# Patient Record
Sex: Female | Born: 1935 | Race: White | Hispanic: No | Smoking: Never smoker
Health system: Southern US, Community
[De-identification: ages and names within clinical notes are randomized; demographics above are authoritative.]

## PROBLEM LIST (undated history)

## (undated) DIAGNOSIS — M1990 Unspecified osteoarthritis, unspecified site: Secondary | ICD-10-CM

## (undated) DIAGNOSIS — Z96649 Presence of unspecified artificial hip joint: Secondary | ICD-10-CM

## (undated) DIAGNOSIS — R32 Unspecified urinary incontinence: Secondary | ICD-10-CM

## (undated) DIAGNOSIS — I4891 Unspecified atrial fibrillation: Secondary | ICD-10-CM

## (undated) DIAGNOSIS — I10 Essential (primary) hypertension: Secondary | ICD-10-CM

## (undated) DIAGNOSIS — F0390 Unspecified dementia without behavioral disturbance: Secondary | ICD-10-CM

## (undated) DIAGNOSIS — Z96659 Presence of unspecified artificial knee joint: Secondary | ICD-10-CM

## (undated) HISTORY — PX: OTHER SURGICAL HISTORY: SHX169

## (undated) HISTORY — PX: TOTAL ABDOMINAL HYSTERECTOMY W/ BILATERAL SALPINGOOPHORECTOMY: SHX83

## (undated) HISTORY — PX: KNEE SURGERY: SHX244

## (undated) HISTORY — PX: REPLACEMENT TOTAL KNEE: SUR1224

---

## 2014-11-12 ENCOUNTER — Ambulatory Visit (INDEPENDENT_AMBULATORY_CARE_PROVIDER_SITE_OTHER): Payer: Medicare Other | Admitting: Podiatry

## 2014-11-12 ENCOUNTER — Encounter: Payer: Self-pay | Admitting: Podiatry

## 2014-11-12 VITALS — BP 109/67 | HR 76 | Resp 16 | Ht 66.0 in | Wt 145.0 lb

## 2014-11-12 DIAGNOSIS — M21372 Foot drop, left foot: Secondary | ICD-10-CM

## 2014-11-12 DIAGNOSIS — M79676 Pain in unspecified toe(s): Secondary | ICD-10-CM

## 2014-11-12 DIAGNOSIS — L84 Corns and callosities: Secondary | ICD-10-CM

## 2014-11-12 DIAGNOSIS — B351 Tinea unguium: Secondary | ICD-10-CM

## 2014-11-12 NOTE — Progress Notes (Signed)
   Subjective:    Patient ID: Jill House, female    DOB: 1923-12-19, 80 y.o.   MRN: [id number]  HPI Comments: 80 year old female presents to the office today with her niece for whom she lives with for evaluation of painful elongated toenails as well as a callus on the right foot. The patient also has a history of venous leg ulcerations for which he has seen by the vein specialists. She has had 2 rounds of Keflex that she referred ulceration on the left lower shoe me. They deny any recent ulceration or any current ulceration. 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 She does have an AFO for which she does not wear as she states that she gets a well without any difficulties without it. She also states she has chronic swelling to the left leg. No other complaints at this time.    Foot Pain      Review of Systems  Cardiovascular: Positive for leg swelling.  Musculoskeletal: Positive for back pain.       Difficulty walking  All other systems reviewed and are negative.      Objective:   Physical Exam AAO x3, NAD DP pulses palpable, PT pulses decreased. CRT < 3 sec Protective sensation decreased with Simms Weinstein monofilament, decreased aperture sensation, decreased Achilles tendon reflex Decreased dorsiflexion of the left lower extremity. There is dropfoot present. Nails hypertrophic, dystrophic, elongated, brittle, discolored 10. No surrounding erythema or drainage from around the nail sites. Hyperkeratotic lesion along the medial aspect of the IPJ on the right hallux. There is mild hyperkeratotic lesions overlying the medial aspect of the first and tea PJ bilaterally. Upon debridement underlying ulceration. There is hyperpigmented areas overlying the dorsal aspect of left foot/anterior ankle from prior ulceration. Currently there is no evidence of skin breakdown. There is mild chronic edema to  the left lower extremity.  There is no pain with calf compression, sewing, warmth, erythema        Assessment & Plan:  80 year old female symptomatic onychomycosis, hyperkeratotic lesions; dropfoot -Treatment options were discussed including alternatives, risks, complications. -Nail sharply debrided 10 without complications.  -Hyperkeratotic lesion x2 sharply debrided without complication (There was not significant buildup of tissue along the left foot).  -Discussed the importance of daily foot inspection -She has a brace which is fitting well without any difficulties. She chooses not to wear it.  -Follow-up in 3 months, or sooner should any problems arise. In the meantime, call the office with any questions, concerns, change in symptoms.  -

## 2014-11-30 LAB — CBC WITH DIFFERENTIAL/PLATELET
BASOS ABS: 0.1 10*3/uL (ref 0.0–0.1)
Basophil %: 0.6 %
Eosinophil #: 0 10*3/uL (ref 0.0–0.7)
Eosinophil %: 0.2 %
HCT: 33.4 % — ABNORMAL LOW (ref 35.0–47.0)
HGB: 10.4 g/dL — ABNORMAL LOW (ref 12.0–16.0)
LYMPHS ABS: 0.5 10*3/uL — AB (ref 1.0–3.6)
Lymphocyte %: 4.1 %
MCH: 25.8 pg — AB (ref 26.0–34.0)
MCHC: 31.1 g/dL — AB (ref 32.0–36.0)
MCV: 83 fL (ref 80–100)
Monocyte #: 1.1 x10 3/mm — ABNORMAL HIGH (ref 0.2–0.9)
Monocyte %: 8.9 %
Neutrophil #: 10.7 10*3/uL — ABNORMAL HIGH (ref 1.4–6.5)
Neutrophil %: 86.2 %
PLATELETS: 124 10*3/uL — AB (ref 150–440)
RBC: 4.02 10*6/uL (ref 3.80–5.20)
RDW: 17.6 % — ABNORMAL HIGH (ref 11.5–14.5)
WBC: 12.4 10*3/uL — ABNORMAL HIGH (ref 3.6–11.0)

## 2014-11-30 LAB — COMPREHENSIVE METABOLIC PANEL
ALT: 10 U/L — AB
AST: 14 U/L — AB (ref 15–37)
Albumin: 3.4 g/dL (ref 3.4–5.0)
Alkaline Phosphatase: 67 U/L
Anion Gap: 5 — ABNORMAL LOW (ref 7–16)
BUN: 29 mg/dL — ABNORMAL HIGH (ref 7–18)
Bilirubin,Total: 0.6 mg/dL (ref 0.2–1.0)
CALCIUM: 8.2 mg/dL — AB (ref 8.5–10.1)
CHLORIDE: 102 mmol/L (ref 98–107)
Co2: 30 mmol/L (ref 21–32)
Creatinine: 1.74 mg/dL — ABNORMAL HIGH (ref 0.60–1.30)
EGFR (Non-African Amer.): 29 — ABNORMAL LOW
GFR CALC AF AMER: 36 — AB
GLUCOSE: 117 mg/dL — AB (ref 65–99)
Osmolality: 281 (ref 275–301)
Potassium: 4.5 mmol/L (ref 3.5–5.1)
Sodium: 137 mmol/L (ref 136–145)
Total Protein: 6.9 g/dL (ref 6.4–8.2)

## 2014-11-30 LAB — URINALYSIS, COMPLETE
BILIRUBIN, UR: NEGATIVE
Bacteria: NONE SEEN
Glucose,UR: NEGATIVE mg/dL (ref 0–75)
Ketone: NEGATIVE
Leukocyte Esterase: NEGATIVE
Nitrite: NEGATIVE
PROTEIN: NEGATIVE
Ph: 6 (ref 4.5–8.0)
Specific Gravity: 1.019 (ref 1.003–1.030)
WBC UR: <1 /HPF (ref 0–5)

## 2014-11-30 LAB — TROPONIN I: TROPONIN-I: 0.02 ng/mL

## 2014-11-30 LAB — PROTIME-INR
INR: 1.2
PROTHROMBIN TIME: 15.4 s — AB (ref 11.5–14.7)

## 2014-12-01 ENCOUNTER — Observation Stay: Payer: Self-pay | Admitting: Specialist

## 2014-12-01 LAB — INFLUENZA A,B,H1N1 - PCR (ARMC)
H1N1FLUPCR: NOT DETECTED
INFLAPCR: NEGATIVE
Influenza B By PCR: NEGATIVE

## 2014-12-01 LAB — CBC WITH DIFFERENTIAL/PLATELET
Basophil #: 0 10*3/uL (ref 0.0–0.1)
Basophil %: 0.5 %
Eosinophil #: 0 10*3/uL (ref 0.0–0.7)
Eosinophil %: 0.4 %
HCT: 29.9 % — ABNORMAL LOW (ref 35.0–47.0)
HGB: 9.4 g/dL — ABNORMAL LOW (ref 12.0–16.0)
Lymphocyte #: 0.7 10*3/uL — ABNORMAL LOW (ref 1.0–3.6)
Lymphocyte %: 7.1 %
MCH: 26.3 pg (ref 26.0–34.0)
MCHC: 31.5 g/dL — ABNORMAL LOW (ref 32.0–36.0)
MCV: 84 fL (ref 80–100)
Monocyte #: 1.3 x10 3/mm — ABNORMAL HIGH (ref 0.2–0.9)
Monocyte %: 12.9 %
Neutrophil #: 7.7 10*3/uL — ABNORMAL HIGH (ref 1.4–6.5)
Neutrophil %: 79.1 %
Platelet: 96 10*3/uL — ABNORMAL LOW (ref 150–440)
RBC: 3.58 10*6/uL — ABNORMAL LOW (ref 3.80–5.20)
RDW: 17.4 % — ABNORMAL HIGH (ref 11.5–14.5)
WBC: 9.8 10*3/uL (ref 3.6–11.0)

## 2014-12-01 LAB — BASIC METABOLIC PANEL
Anion Gap: 10 (ref 7–16)
BUN: 25 mg/dL — ABNORMAL HIGH (ref 7–18)
Calcium, Total: 7.8 mg/dL — ABNORMAL LOW (ref 8.5–10.1)
Chloride: 106 mmol/L (ref 98–107)
Co2: 24 mmol/L (ref 21–32)
Creatinine: 1.58 mg/dL — ABNORMAL HIGH (ref 0.60–1.30)
EGFR (African American): 40 — ABNORMAL LOW
EGFR (Non-African Amer.): 33 — ABNORMAL LOW
Glucose: 93 mg/dL (ref 65–99)
Osmolality: 283 (ref 275–301)
Potassium: 3.9 mmol/L (ref 3.5–5.1)
Sodium: 140 mmol/L (ref 136–145)

## 2014-12-01 LAB — TSH: Thyroid Stimulating Horm: 2.14 u[IU]/mL

## 2014-12-02 LAB — CREATININE, SERUM
Creatinine: 1.49 mg/dL — ABNORMAL HIGH (ref 0.60–1.30)
EGFR (African American): 43 — ABNORMAL LOW
EGFR (Non-African Amer.): 35 — ABNORMAL LOW

## 2014-12-02 LAB — URINE CULTURE

## 2014-12-05 LAB — CULTURE, BLOOD (SINGLE)

## 2015-01-17 ENCOUNTER — Ambulatory Visit (INDEPENDENT_AMBULATORY_CARE_PROVIDER_SITE_OTHER): Payer: Medicare Other | Admitting: Podiatry

## 2015-01-17 ENCOUNTER — Encounter: Payer: Self-pay | Admitting: Podiatry

## 2015-01-17 ENCOUNTER — Ambulatory Visit (INDEPENDENT_AMBULATORY_CARE_PROVIDER_SITE_OTHER): Payer: Medicare Other

## 2015-01-17 VITALS — BP 122/86 | HR 72 | Resp 12

## 2015-01-17 DIAGNOSIS — L97529 Non-pressure chronic ulcer of other part of left foot with unspecified severity: Secondary | ICD-10-CM

## 2015-01-17 DIAGNOSIS — L03116 Cellulitis of left lower limb: Secondary | ICD-10-CM

## 2015-01-17 DIAGNOSIS — L8991 Pressure ulcer of unspecified site, stage 1: Secondary | ICD-10-CM

## 2015-01-17 DIAGNOSIS — R52 Pain, unspecified: Secondary | ICD-10-CM

## 2015-01-17 MED ORDER — CEPHALEXIN 500 MG PO CAPS: 500.0000 mg | ORAL_CAPSULE | Freq: Three times a day (TID) | ORAL | Status: DC

## 2015-01-17 NOTE — Patient Instructions (Signed)
Continue daily dressing changes as discussed Monitor for any signs/symptoms of infection. Call the office immediately if any occur or go directly to the emergency room. Call with any questions/concerns.  

## 2015-01-17 NOTE — Progress Notes (Signed)
   Subjective:    Patient ID: Jill House, female    DOB: May 06, 1924, [age] y.o.   MRN: [id number]  HPI 81 year old female presents the office today with complaints of pain ulceration to her left foot has been ongoing since yesterday. She states thatshe started noted that her foot became more red and she also has painful calluses on the bottom of the inside aspect of her left foot. She denies any systemic complaints as fevers, chills, nausea, vomiting. She denies any history of injury or trauma. She's had no previous antibiotics for this. No prior treatment.   Review of Systems  Musculoskeletal: Positive for gait problem.  All other systems reviewed and are negative.      Objective:   Physical Exam AAO 3, NAD DP pulses palpable bilaterally, PT pulses mild decrease, CRT less than 3 seconds Protective sensation decreased with Simms Weinstein monofilament, decreased vibratory sensation, Achilles tendon reflex intact. On the plantar medial aspect of the left foot there is 2 hyperkeratotic lesions. Upon debridement of the more distal lesion there was an underlying ulceration measuring 0.5 x 0.3 cm with a granular wound base. Upon debridement there was a area of superficial purulence expressed. There is no probing, undermining, tunneling. There is no surrounding fluctuance or crepitus. There is erythema extending on the medial aspect of the foot from approximately level of the MTPJ to the midfoot over to the second metatarsal area. There is no areas of fluctuance or crepitus. There is no malodor. Upon debridement on the more proximal hyperkeratotic lesion no underlying ulceration. No other open lesions or pre-ulcerative lesions identified bilaterally. No other areas of tenderness to bilateral lower shoes. No pain with calf compression, swelling, warmth, erythema.      Assessment & Plan:  81 year old female with left foot cellulitis, ulceration. -X-rays were obtained and reviewed the  patient. -Treatment options were discussed including alternatives, risks, complications. -Lesions were sharply debrided without complications. Upon debridement of the distal lesion underlying ulceration and there was a small superficial area purulence. Wound was debrided to healthy, bleeding, granular wound base. No further purulence was expressed.  -Prescribed Keflex. Monitor for any signs or symptoms of worsening infection and directed to call the office immediately should any occur or go to the ER.  -Recommend he continue with daily dressing changes. I small amount and antibiotic ointment and a dry sterile dressing daily.  -Follow-up in 1 week or sooner if any palms are to arise. In the meantime, and occurs call the office with any questions, concerns, change in symptoms.

## 2015-01-20 ENCOUNTER — Telehealth: Payer: Self-pay | Admitting: *Deleted

## 2015-01-20 NOTE — Telephone Encounter (Signed)
We can try clindamycin 150mg  TID x 10 days.

## 2015-01-20 NOTE — Telephone Encounter (Addendum)
[name]-caregiver states Keflex is making pt tremulous inside after taking it, even with food.  Maralyn SagoSarah says foot looks good and is using the topical medication, but has held the 200pm dose.  I told her to continue the pt's topical antibiotic, but hold off the Keflex until other instructions from Dr. Ardelle AntonWagoner.  Informed pt's niece, Maralyn SagoSarah, changed to Clindamycin and stop the Keflex.  Maralyn SagoSarah states pt stopped the trembling once the Keflex was stopped.  Clindamycin ordered.

## 2015-01-21 MED ORDER — CLINDAMYCIN HCL 150 MG PO CAPS: 150.0000 mg | ORAL_CAPSULE | Freq: Three times a day (TID) | ORAL | Status: DC

## 2015-01-28 ENCOUNTER — Ambulatory Visit (INDEPENDENT_AMBULATORY_CARE_PROVIDER_SITE_OTHER): Payer: Medicare Other | Admitting: Podiatry

## 2015-01-28 VITALS — BP 106/72 | HR 104

## 2015-01-28 DIAGNOSIS — G629 Polyneuropathy, unspecified: Secondary | ICD-10-CM

## 2015-01-28 DIAGNOSIS — L84 Corns and callosities: Secondary | ICD-10-CM

## 2015-01-28 DIAGNOSIS — M79676 Pain in unspecified toe(s): Secondary | ICD-10-CM

## 2015-01-28 DIAGNOSIS — B351 Tinea unguium: Secondary | ICD-10-CM | POA: Diagnosis not present

## 2015-01-29 NOTE — Progress Notes (Signed)
Patient ID: Jill House, female   DOB: [birth year], 81 y.o.   MRN: [id number]  Subjective: 81 year old female presents the office today for follow up evaluation of ulceration infection to her left foot. Since last appointment she was unable to tolerate the Keflex and she was started on clindamycin which she continues to take. She is able to the clindamycin without any side effects. There were also continuing to use ointments overlying the wounds however they've discontinued them as her caregiver states the area has healed. She states that the redness has significantly improved as well as the pain. She denies any systemic complaints as fevers, chills, nausea, vomiting. Also this time she states that her nails are painful as they are elongated and very thickened. She is requesting nail debridement of this time. She denies any redness or drainage on the nail sites. No other complaints at this time.  Objective: AAO 3, NAD DP pulses palpable bilaterally, PT pulses 1/4, CRT less than 3 seconds Protective sensation decreased with Sims once the monofilament, decreased vibratory sensation, Achilles tendon reflex intact.  On the plantar medial aspect the left foot the 2 areas of prior ulceration, hyperkeratotic lesions have healed. There is significant decrease in erythema overlying the left foot in which there is only a trace amount of faint erythema overlying the area. There is no overlying fluctuance or crepitus, ascending cellulitis. There is no open lesions identified at this time. There is not any significant  hyperkeratotic buildup over these areas and the left foot. No malodor.  Nails are hypertrophic, dystrophic, elongated, brittle, discolored 10. There subjective tenderness on the nails 1-5 bilaterally. There is no swelling erythema or drainage on the nail sites. On the right foot hyperkeratotic lesions on the plantar aspect of the heel and along the forefoot. Upon debridement lesions there is no  underlying ulceration, drainage, or other clinical signs of infection. No other open lesions or pre-ulcerative lesions are identified bilaterally. No other areas of edema, erythema, increase in warmth bilaterally. No pain with calf compression, swelling, warmth, erythema.  Assessment:81 year old female with resolving cellulitis, ulceration left foot; symptomatic onychomycosis, right foot hyperkeratotic lesions 2  Plan: -Treatment options were discussed with the patient include alternatives, risks, complications -At this time it appears that the erythema has significantly resolved on the left foot and the wounds of healed. Finish the course of antibiotics. Monitor for any signs or symptoms of reoccurrence of infection or ulceration. If any are to occur to call the office immediately. -Nail sharply debrided 10 without complication/bleeding. -Hyperkeratotic lesions right foot sharply debrided 2 without complication/bleeding. -Discussed importance of daily foot inspection. -Follow-up in 3 months or sooner if any problems are to arise. In the meantime, encouraged to call the office any questions, concerns, change in symptoms.

## 2015-02-04 ENCOUNTER — Ambulatory Visit: Payer: Medicare Other | Admitting: Podiatry

## 2015-02-11 ENCOUNTER — Ambulatory Visit: Payer: Medicare Other | Admitting: Podiatry

## 2015-03-26 NOTE — H&P (Signed)
PATIENT NAME:  Jill House, Jill House MR#:  [id number] DATE OF BIRTH:  [birth year]  DATE OF ADMISSION:  12/01/2014  ADMIT DIAGNOSIS: Sepsis.  REFERRING PHYSICIAN: Coolidge BreezeGraydon S. Goodman, MD  PRIMARY CARE PHYSICIAN: Non-local  HISTORY OF PRESENT ILLNESS: This is an 81 year old Caucasian female who presents to the Emergency Department complaining of fatigue. The patient states that she been feeling tired for two days. The day of admission, she awoke in the morning, but stayed in bed until noon, which was rare for her. The patient ate a somewhat smaller breakfast in usual and then went back to bed. She slept for another 4 hours, after which her niece came to check on her and measured her temperature to be 100.4. At that time, she appeared pale and short of breath. The niece, who is a former Engineer, civil (consulting)nurse, measured her pulse to be 130 beats per minute. She notes that the patient had the same symptoms last summer when she was diagnosed with pneumonia. Due to this constellation of symptoms, the patient was evaluated in the Emergency Department and found to have a significant leukocytosis and fever, which prompted the Emergency Department staff to call for admission.  REVIEW OF SYSTEMS:  CONSTITUTIONAL: The patient denies fevers, but admits to weakness.  EYES: Denies blurred vision or inflammation.  EARS, NOSE, AND THROAT: Denies tinnitus or sore throat.  RESPIRATORY: Denies cough or shortness of breath.  CARDIOVASCULAR: Denies chest pain or palpitations.  GASTROINTESTINAL: Denies nausea, vomiting, diarrhea, or abdominal pain.  GENITOURINARY: Denies dysuria, increased frequency, or hesitancy of urination.  ENDOCRINE: Denies polyuria or polydipsia. HEMATOLOGIC AND LYMPHATIC: She admits to easy bruising, but denies bleeding.  INTEGUMENTARY: Denies rashes or lesions.  MUSCULOSKELETAL: Admits to arthralgias and chronic back pain, but denies myalgias.  NEUROLOGIC: Denies numbness in her extremities or dysarthria.   PSYCHIATRIC: Denies depression or suicidal ideation. The patient admits to some memory loss.   PAST MEDICAL HISTORY: Atrial fibrillation, hypertension, hyperlipidemia, osteoarthritis, and history of methicillin-resistant Staphylococcus aureus wound infection.   PAST SURGICAL HISTORY: The patient has had bilateral total knee replacements, a total abdominal hysterectomy and bilateral salpingo-oophorectomy, cholecystectomy as well as  bilateral hip replacements.   SOCIAL HISTORY: The patient does not smoke or do any drugs. She occasionally drinks wine.   FAMILY HISTORY: All of the patient's sisters and brothers are deceased from heart disease at an early age.   MEDICATIONS: 1.  Donepezil 10 mg 1 tablet p.o. at bedtime.  2.  Eliquis 2.5 mg 1 tab p.o. b.i.d.  3.  Gabapentin 600 mg 1 tab p.o. t.i.d.  4.  Hydrochlorothiazide 12.5 mg capsule, 1 capsule p.o. daily.  5.  Metoprolol 50 mg extended release 1 tablet p.o. daily.  6.  Oxycodone 10 mg 1 tab p.o. t.i.d. as needed for pain.  7.  Pantoprazole 40 mg delayed release capsule 1 tablet p.o. daily.  8.  Potassium chloride 10 mEq extended release capsule 1 capsule p.o. daily.  9.  Pravastatin 40 mg 1 tab p.o. at bedtime.  10.  Raloxifene 60 mg 1 tab p.o. daily.  11.  Vitamin D3 at 2000 international units 1 capsule p.o. daily.   ALLERGIES: CIPROFLOXACIN.  PERTINENT LABORATORY RESULTS AND RADIOGRAPHIC FINDINGS: Serum glucose is 117, BUN is 29, creatinine 1.74, serum sodium 137, potassium is 4.5, chloride is 102, bicarbonate 30, calcium is 8.2, serum albumin 3.4, alkaline phosphatase 67, AST is 14, ALT is 10. Troponin is negative. White blood cell count is 12.4, hemoglobin 10.4, hematocrit 33.4, platelet count is 124,000.  MCV is 83. INR is 1.2.   Urinalysis is negative for infection.  Influenza A, B and novel strain are all negative. Venous lactic acid is 1.3.  Chest x-ray shows no acute cardiopulmonary findings. There is some cardiomegaly with  underlying chronic interstitial change.   PHYSICAL EXAMINATION: VITAL SIGNS: Temperature is 100.7, pulse 89, respirations 19, blood pressure 106/73, pulse oximetry is 97% on room air.  GENERAL: The patient is alert and oriented x3 in no apparent distress.  HEENT: Normocephalic, atraumatic. Pupils equal, round, and reactive to light and accommodation. Extraocular movements are intact. Mucous membranes are moist.  NECK: Trachea is midline. No adenopathy. Thyroid is nontender and nonpalpable.  CHEST: Symmetric and atraumatic.  CARDIOVASCULAR: Irregularly irregular rhythm with normal S1, S2. No rubs, clicks, or murmurs appreciated.  LUNGS: Clear to auscultation bilaterally. Normal effort and excursion.  ABDOMEN: Positive bowel sounds. Soft, nontender, nondistended. No hepatosplenomegaly.  GENITOURINARY: Deferred.  MUSCULOSKELETAL: The patient moves all 4 extremities equally. There is 5/5 strength in upper and lower extremities bilaterally.  SKIN: Warm and dry. There are no rashes or lesions.  EXTREMITIES: No clubbing, cyanosis, or edema.  NEUROLOGIC: Cranial nerves II through XII are grossly intact.  PSYCHIATRIC: Mood is normal. Affect is congruent. The patient has excellent insight into her illness as well as judgment.   ASSESSMENT AND PLAN: This is an 81 year old female admitted for sepsis.  1.  Sepsis. The patient meets criteria via leukocytosis and fever. Unfortunately, at this time there is no identifiable source. She is hemodynamically stable and we have obtained blood cultures and urine cultures, which we will follow for growth and sensitivities. Vancomycin and Zosyn have been started in the Emergency Department.  2.  Atrial fibrillation. This is apparently persistent. She reportedly had rapid ventricular response prior to arrival, but is now rate controlled. We will continue Eliquis.  3.  Hypertension. I will continue metoprolol and hydrochlorothiazide.  4.  Osteoarthritis. The patient is  on rather large quantities of narcotics for her age, but this is apparently chronic and regulated by the pain management clinic. We will continue her analgesic regimen as needed.  5.  Deep vein thrombosis prophylaxis. We will continue apixaban.  6.  Gastrointestinal prophylaxis. Unnecessary, as the patient is not critically ill.  CODE STATUS: THE PATIENT IS A FULL CODE AT THIS TIME.  TIME SPENT ON ADMISSION ORDERS AND PATIENT CARE: Approximately 35 minutes patient:    ____________________________ Kelton Pillar. Sheryle Hail, MD msd:sw D: 12/01/2014 04:51:46 ET T: 12/01/2014 07:30:45 ET JOB#: [id number]  cc: Kelton Pillar. Sheryle Hail, MD, <Dictator> Kelton Pillar Aneisa Karren MD ELECTRONICALLY SIGNED 12/11/2014 22:50

## 2015-03-30 NOTE — Discharge Summary (Signed)
PATIENT NAME:  Jill House, [name] MR#:  [id number] DATE OF BIRTH:  Dec 09, 1923  DATE OF ADMISSION:  12/01/2014 DATE OF DISCHARGE:  12/02/2014  For detailed note, please look at the history and physical done at admission by Dr. Sheryle Hailiamond.   DIAGNOSES AT DISCHARGE ARE AS FOLLOWS:  Sepsis suspected, although ruled out; history of chronic atrial fibrillation; hypertension; hyperlipidemia; history of dementia; gastroesophageal reflux disease.   The patient is being discharged on a low-sodium, low-fat diet. Activity is as tolerated. Followup is with Dr. Yves DillNeelam Khan in the next 1 to 2 weeks.  DISCHARGE MEDICATIONS:  Evista 60 mg daily; potassium 20 mEq daily; Aricept 10 mg daily; Eliquis 2.5 mg b.i.d.; vitamin D3, 2000 international units daily; HCTZ 12.5 mg daily; oxycodone 10 mg t.i.d. as needed; Pravachol 40 mg at bedtime; Protonix 40 mg daily; metoprolol succinate 50 mg daily; gabapentin 600 mg 3 tabs b.i.d.; Augmentin 500/125 one tab b.i.d. x 5 days.   PERTINENT STUDIES DONE DURING THE HOSPITAL COURSE ARE AS FOLLOWS:  A chest x-ray done on admission showed no acute cardiopulmonary findings. Urinalysis noted to be negative. Blood cultures and urine cultures also essentially noted to be negative.   HOSPITAL COURSE:  This is an 81 year old female with medical problems as mentioned above, who presented to the hospital with fever, tachycardia, and suspected sepsis.   1.  Suspected sepsis. This was the working diagnosis on admission given the patient's fever and tachycardia, although the source of the sepsis was unclear. As per the family, whenever the patient develops a fever, the most likely source is pulmonary, although the patient's chest x-ray was negative, her urinalysis was negative, and her blood cultures over the past 36 hours have been negative. Initially, the patient was empirically treated with IV Zosyn and vancomycin, and since she is clinically doing better with no fever and no source of  infection, she is being discharged on oral Augmentin presently.  2.  History of chronic atrial fibrillation. The patient has remained rate controlled. She will continue her Toprol. She is on Eliquis, which she will continue.  3.  Hypertension. The patient remained hemodynamically stable. She will continue her hydrochlorothiazide and Toprol.  4.  History of breast cancer. The patient was maintained on Evista. She will resume that.  5.  History of dementia. The patient was maintained on Aricept. She will resume that along with her amantadine.  6.  Hyperlipidemia. The patient was maintained on Pravachol. She will also resume that upon discharge.   CODE STATUS:  The patient is a full code.   She is being discharged home.   TIME SPENT:  40 minutes.    ____________________________ Rolly PancakeVivek J. Cherlynn KaiserSainani, MD vjs:nb D: 12/02/2014 16:09:14 ET T: 12/02/2014 23:34:36 ET JOB#: [id number]  cc: Rolly PancakeVivek J. Cherlynn KaiserSainani, MD, <Dictator> Margaretann LovelessNeelam S. Khan, MD Houston SirenVIVEK J Brianda Beitler MD ELECTRONICALLY SIGNED 12/24/2014 10:35

## 2015-04-29 ENCOUNTER — Ambulatory Visit (INDEPENDENT_AMBULATORY_CARE_PROVIDER_SITE_OTHER): Payer: Medicare Other | Admitting: Podiatry

## 2015-04-29 DIAGNOSIS — G629 Polyneuropathy, unspecified: Secondary | ICD-10-CM

## 2015-04-29 DIAGNOSIS — M79676 Pain in unspecified toe(s): Secondary | ICD-10-CM | POA: Diagnosis not present

## 2015-04-29 DIAGNOSIS — B351 Tinea unguium: Secondary | ICD-10-CM

## 2015-04-29 DIAGNOSIS — L84 Corns and callosities: Secondary | ICD-10-CM | POA: Diagnosis not present

## 2015-04-29 NOTE — Progress Notes (Signed)
Patient ID: Jill House, female   DOB: 1924/08/29, 81 y.o.   MRN: [id number]  Subjective: 81 y.o.-year-old female returns the office today for painful, elongated, thickened toenails which she cannot trim herself. Denies any redness or drainage around the nails. Denies any acute changes since last appointment and no new complaints today. Denies any systemic complaints such as fevers, chills, nausea, vomiting.   Objective: AAO 3, NAD DP/PT pulses palpable, CRT less than 3 seconds Protective sensation decreased with Simms Weinstein monofilament, Achilles tendon reflex intact.  Nails hypertrophic, dystrophic, elongated, brittle, discolored 10. There is tenderness overlying the nails 1-5 bilaterally. There is no surrounding erythema or drainage along the nail sites. Hyperkeratotic lesions bilateral medial hallux IPJ, medial first MTPJ, right plantar heel. Upon debridement of the lesion there is no underlying ulcerations, drainage, or any other clinical signs of infection. No open lesions or other pre-ulcerative lesions are identified. No other areas of tenderness bilateral lower extremities. No overlying edema, erythema, increased warmth. No pain with calf compression, swelling, warmth, erythema.  Assessment: Patient presents with symptomatic onychomycosis, thick keratotic lesions  Plan: -Treatment options including alternatives, risks, complications were discussed -Nails sharply debrided 10 without complication/bleeding. -Hyperkeratotic lesions she'll be debrided 3 without complication/bleeding. -Discussed daily foot inspection. If there are any changes, to call the office immediately.  -Follow-up in 3 months or sooner if any problems are to arise. In the meantime, encouraged to call the office with any questions, concerns, changes symptoms.

## 2015-07-29 ENCOUNTER — Ambulatory Visit (INDEPENDENT_AMBULATORY_CARE_PROVIDER_SITE_OTHER): Payer: Medicare Other | Admitting: Podiatry

## 2015-07-29 DIAGNOSIS — B351 Tinea unguium: Secondary | ICD-10-CM

## 2015-07-29 DIAGNOSIS — Q828 Other specified congenital malformations of skin: Secondary | ICD-10-CM

## 2015-07-29 DIAGNOSIS — M79676 Pain in unspecified toe(s): Secondary | ICD-10-CM

## 2015-07-29 DIAGNOSIS — G629 Polyneuropathy, unspecified: Secondary | ICD-10-CM

## 2015-07-29 NOTE — Progress Notes (Signed)
Patient ID: Tekia Waterbury, female   DOB: [birth year], 81 y.o.   MRN: [id number]  Subjective: 81 y.o.-year-old female returns the office today for painful, elongated, thickened toenails which she cannot trim herself. Denies any redness or drainage around the nails. Denies any acute changes since last appointment and no new complaints today. Denies any systemic complaints such as fevers, chills, nausea, vomiting.   Objective: AAO 3, NAD DP/PT pulses palpable, CRT less than 3 seconds Protective sensation decreased with Simms Weinstein monofilament. Nails hypertrophic, dystrophic, elongated, brittle, discolored 10. There is tenderness overlying the nails 1-5 bilaterally. There is no surrounding erythema or drainage along the nail sites. Hyperkeratotic lesions bilateral medial hallux IPJ, medial first MTPJ, right plantar heel. Upon debridement of the lesion there is no underlying ulcerations, drainage, or any other clinical signs of infection. No open lesions or other pre-ulcerative lesions are identified. No other areas of tenderness bilateral lower extremities. No overlying edema, erythema, increased warmth. No pain with calf compression, swelling, warmth, erythema.  Assessment: Patient presents with symptomatic onychomycosis, thick keratotic lesions  Plan: -Treatment options including alternatives, risks, complications were discussed -Nails sharply debrided 10 without complication/bleeding. -Hyperkeratotic lesions she'll be debrided 3 without complication/bleeding. -Discussed daily foot inspection. If there are any changes, to call the office immediately.  -Follow-up in 3 months or sooner if any problems are to arise. In the meantime, encouraged to call the office with any questions, concerns, changes symptoms.  Ovid Curd, DPM

## 2015-08-11 ENCOUNTER — Other Ambulatory Visit: Payer: Self-pay | Admitting: Podiatry

## 2015-08-11 NOTE — Telephone Encounter (Signed)
I spoke with pt's Niece, Ms. Whitticker she stated pt is still asleep.  I told niece that pt was prescribed the Clindamycin in 12/2014 and if she was having a problem, that would require an antibiotic she would need to have an appt.  Ms. Germain Osgood states she will discuss with pt and call back.

## 2015-08-13 ENCOUNTER — Emergency Department: Payer: Medicare Other

## 2015-08-13 ENCOUNTER — Inpatient Hospital Stay
Admission: EM | Admit: 2015-08-13 | Discharge: 2015-08-19 | DRG: 480 | Disposition: A | Discharge status: Skilled Nursing Facility | Payer: Medicare Other | Attending: Internal Medicine | Admitting: Internal Medicine

## 2015-08-13 ENCOUNTER — Encounter: Payer: Self-pay | Admitting: Emergency Medicine

## 2015-08-13 DIAGNOSIS — R0902 Hypoxemia: Secondary | ICD-10-CM

## 2015-08-13 DIAGNOSIS — Z90710 Acquired absence of both cervix and uterus: Secondary | ICD-10-CM

## 2015-08-13 DIAGNOSIS — W01198A Fall on same level from slipping, tripping and stumbling with subsequent striking against other object, initial encounter: Secondary | ICD-10-CM | POA: Diagnosis present

## 2015-08-13 DIAGNOSIS — Z7902 Long term (current) use of antithrombotics/antiplatelets: Secondary | ICD-10-CM | POA: Diagnosis not present

## 2015-08-13 DIAGNOSIS — Z8249 Family history of ischemic heart disease and other diseases of the circulatory system: Secondary | ICD-10-CM | POA: Diagnosis not present

## 2015-08-13 DIAGNOSIS — S72402A Unspecified fracture of lower end of left femur, initial encounter for closed fracture: Secondary | ICD-10-CM | POA: Diagnosis not present

## 2015-08-13 DIAGNOSIS — S72009A Fracture of unspecified part of neck of unspecified femur, initial encounter for closed fracture: Secondary | ICD-10-CM | POA: Diagnosis present

## 2015-08-13 DIAGNOSIS — J9601 Acute respiratory failure with hypoxia: Secondary | ICD-10-CM | POA: Diagnosis not present

## 2015-08-13 DIAGNOSIS — D696 Thrombocytopenia, unspecified: Secondary | ICD-10-CM | POA: Diagnosis not present

## 2015-08-13 DIAGNOSIS — F0390 Unspecified dementia without behavioral disturbance: Secondary | ICD-10-CM | POA: Diagnosis present

## 2015-08-13 DIAGNOSIS — Z419 Encounter for procedure for purposes other than remedying health state, unspecified: Secondary | ICD-10-CM

## 2015-08-13 DIAGNOSIS — M810 Age-related osteoporosis without current pathological fracture: Secondary | ICD-10-CM | POA: Diagnosis present

## 2015-08-13 DIAGNOSIS — Z79899 Other long term (current) drug therapy: Secondary | ICD-10-CM

## 2015-08-13 DIAGNOSIS — D649 Anemia, unspecified: Secondary | ICD-10-CM | POA: Diagnosis present

## 2015-08-13 DIAGNOSIS — Z9181 History of falling: Secondary | ICD-10-CM | POA: Diagnosis not present

## 2015-08-13 DIAGNOSIS — D510 Vitamin B12 deficiency anemia due to intrinsic factor deficiency: Secondary | ICD-10-CM | POA: Diagnosis present

## 2015-08-13 DIAGNOSIS — Y92002 Bathroom of unspecified non-institutional (private) residence single-family (private) house as the place of occurrence of the external cause: Secondary | ICD-10-CM | POA: Diagnosis not present

## 2015-08-13 DIAGNOSIS — N189 Chronic kidney disease, unspecified: Secondary | ICD-10-CM | POA: Diagnosis present

## 2015-08-13 DIAGNOSIS — E538 Deficiency of other specified B group vitamins: Secondary | ICD-10-CM | POA: Diagnosis not present

## 2015-08-13 DIAGNOSIS — K219 Gastro-esophageal reflux disease without esophagitis: Secondary | ICD-10-CM | POA: Diagnosis present

## 2015-08-13 DIAGNOSIS — R32 Unspecified urinary incontinence: Secondary | ICD-10-CM | POA: Diagnosis present

## 2015-08-13 DIAGNOSIS — Z96642 Presence of left artificial hip joint: Secondary | ICD-10-CM | POA: Diagnosis present

## 2015-08-13 DIAGNOSIS — E860 Dehydration: Secondary | ICD-10-CM | POA: Diagnosis present

## 2015-08-13 DIAGNOSIS — S7292XA Unspecified fracture of left femur, initial encounter for closed fracture: Secondary | ICD-10-CM

## 2015-08-13 DIAGNOSIS — M1990 Unspecified osteoarthritis, unspecified site: Secondary | ICD-10-CM | POA: Diagnosis present

## 2015-08-13 DIAGNOSIS — Z66 Do not resuscitate: Secondary | ICD-10-CM | POA: Diagnosis present

## 2015-08-13 DIAGNOSIS — Z96653 Presence of artificial knee joint, bilateral: Secondary | ICD-10-CM | POA: Diagnosis present

## 2015-08-13 DIAGNOSIS — G8929 Other chronic pain: Secondary | ICD-10-CM | POA: Diagnosis present

## 2015-08-13 DIAGNOSIS — I129 Hypertensive chronic kidney disease with stage 1 through stage 4 chronic kidney disease, or unspecified chronic kidney disease: Secondary | ICD-10-CM | POA: Diagnosis present

## 2015-08-13 DIAGNOSIS — I482 Chronic atrial fibrillation: Secondary | ICD-10-CM | POA: Diagnosis present

## 2015-08-13 DIAGNOSIS — Z823 Family history of stroke: Secondary | ICD-10-CM

## 2015-08-13 DIAGNOSIS — E785 Hyperlipidemia, unspecified: Secondary | ICD-10-CM | POA: Diagnosis present

## 2015-08-13 DIAGNOSIS — S72492A Other fracture of lower end of left femur, initial encounter for closed fracture: Principal | ICD-10-CM | POA: Diagnosis present

## 2015-08-13 DIAGNOSIS — M25562 Pain in left knee: Secondary | ICD-10-CM

## 2015-08-13 DIAGNOSIS — N179 Acute kidney failure, unspecified: Secondary | ICD-10-CM | POA: Diagnosis present

## 2015-08-13 DIAGNOSIS — R52 Pain, unspecified: Secondary | ICD-10-CM

## 2015-08-13 HISTORY — DX: Presence of unspecified artificial knee joint: Z96.659

## 2015-08-13 HISTORY — DX: Unspecified dementia, unspecified severity, without behavioral disturbance, psychotic disturbance, mood disturbance, and anxiety: F03.90

## 2015-08-13 HISTORY — DX: Essential (primary) hypertension: I10

## 2015-08-13 HISTORY — DX: Unspecified osteoarthritis, unspecified site: M19.90

## 2015-08-13 HISTORY — DX: Presence of unspecified artificial hip joint: Z96.649

## 2015-08-13 HISTORY — DX: Unspecified atrial fibrillation: I48.91

## 2015-08-13 HISTORY — DX: Unspecified urinary incontinence: R32

## 2015-08-13 LAB — COMPREHENSIVE METABOLIC PANEL
ALT: 14 U/L (ref 14–54)
ANION GAP: 10 (ref 5–15)
AST: 32 U/L (ref 15–41)
Albumin: 3.9 g/dL (ref 3.5–5.0)
Alkaline Phosphatase: 64 U/L (ref 38–126)
BILIRUBIN TOTAL: 0.8 mg/dL (ref 0.3–1.2)
BUN: 26 mg/dL — AB (ref 6–20)
CO2: 30 mmol/L (ref 22–32)
Calcium: 9 mg/dL (ref 8.9–10.3)
Chloride: 103 mmol/L (ref 101–111)
Creatinine, Ser: 1.55 mg/dL — ABNORMAL HIGH (ref 0.44–1.00)
GFR, EST AFRICAN AMERICAN: 33 mL/min — AB (ref >60)
GFR, EST NON AFRICAN AMERICAN: 29 mL/min — AB (ref >60)
Glucose, Bld: 159 mg/dL — ABNORMAL HIGH (ref 65–99)
POTASSIUM: 4.3 mmol/L (ref 3.5–5.1)
Sodium: 143 mmol/L (ref 135–145)
TOTAL PROTEIN: 7.1 g/dL (ref 6.5–8.1)

## 2015-08-13 LAB — MRSA PCR SCREENING: MRSA BY PCR: NEGATIVE

## 2015-08-13 LAB — PROTIME-INR
INR: 1.3
PROTHROMBIN TIME: 16.4 s — AB (ref 11.4–15.0)

## 2015-08-13 LAB — CBC
HEMATOCRIT: 36.1 % (ref 35.0–47.0)
Hemoglobin: 12.1 g/dL (ref 12.0–16.0)
MCH: 30.8 pg (ref 26.0–34.0)
MCHC: 33.4 g/dL (ref 32.0–36.0)
MCV: 92.3 fL (ref 80.0–100.0)
PLATELETS: 109 10*3/uL — AB (ref 150–440)
RBC: 3.91 MIL/uL (ref 3.80–5.20)
RDW: 14.7 % — AB (ref 11.5–14.5)
WBC: 7.7 10*3/uL (ref 3.6–11.0)

## 2015-08-13 MED ORDER — VITAMIN D 1000 UNITS PO TABS
1000.0000 [IU] | ORAL_TABLET | Freq: Every day | ORAL | Status: DC
  Administered 2015-08-14 – 2015-08-19 (×5): 1000 [IU] via ORAL
  Filled 2015-08-13 (×5): qty 1

## 2015-08-13 MED ORDER — HYDROMORPHONE HCL 1 MG/ML IJ SOLN
1.0000 mg | Freq: Once | INTRAMUSCULAR | Status: AC
  Administered 2015-08-13: 1 mg via INTRAVENOUS
  Filled 2015-08-13: qty 1

## 2015-08-13 MED ORDER — METOPROLOL SUCCINATE ER 50 MG PO TB24
150.0000 mg | ORAL_TABLET | Freq: Every evening | ORAL | Status: DC
  Administered 2015-08-13 – 2015-08-18 (×5): 150 mg via ORAL
  Filled 2015-08-13 (×5): qty 1

## 2015-08-13 MED ORDER — FERROUS FUMARATE 325 (106 FE) MG PO TABS
1.0000 | ORAL_TABLET | Freq: Every day | ORAL | Status: DC
  Administered 2015-08-14: 1 via ORAL
  Administered 2015-08-15 – 2015-08-17 (×2): 106 mg via ORAL
  Administered 2015-08-18: 1 via ORAL
  Administered 2015-08-19: 106 mg via ORAL
  Filled 2015-08-13 (×5): qty 1

## 2015-08-13 MED ORDER — OXYCODONE HCL 5 MG PO TABS
10.0000 mg | ORAL_TABLET | Freq: Four times a day (QID) | ORAL | Status: DC | PRN
  Administered 2015-08-13 – 2015-08-19 (×14): 10 mg via ORAL
  Filled 2015-08-13: qty 2
  Filled 2015-08-13: qty 1
  Filled 2015-08-13: qty 2
  Filled 2015-08-13: qty 1
  Filled 2015-08-13 (×11): qty 2

## 2015-08-13 MED ORDER — ONDANSETRON HCL 4 MG/2ML IJ SOLN
4.0000 mg | Freq: Once | INTRAMUSCULAR | Status: AC
  Administered 2015-08-13: 4 mg via INTRAVENOUS
  Filled 2015-08-13: qty 2

## 2015-08-13 MED ORDER — CYANOCOBALAMIN 1000 MCG/ML IJ SOLN
1000.0000 ug | INTRAMUSCULAR | Status: DC
  Filled 2015-08-13: qty 1

## 2015-08-13 MED ORDER — ACETAMINOPHEN 325 MG PO TABS: 650.0000 mg | ORAL_TABLET | Freq: Four times a day (QID) | ORAL | Status: DC | PRN

## 2015-08-13 MED ORDER — POLYETHYLENE GLYCOL 3350 17 G PO PACK
17.0000 g | PACK | Freq: Every day | ORAL | Status: DC | PRN
  Filled 2015-08-13: qty 1

## 2015-08-13 MED ORDER — SODIUM CHLORIDE 0.9 % IV BOLUS (SEPSIS)
500.0000 mL | Freq: Once | INTRAVENOUS | Status: AC
  Administered 2015-08-13: 500 mL via INTRAVENOUS

## 2015-08-13 MED ORDER — MORPHINE SULFATE (PF) 2 MG/ML IV SOLN
1.0000 mg | INTRAVENOUS | Status: DC | PRN
  Administered 2015-08-13: 1 mg via INTRAVENOUS

## 2015-08-13 MED ORDER — MEMANTINE HCL 10 MG PO TABS
5.0000 mg | ORAL_TABLET | Freq: Every day | ORAL | Status: DC
  Administered 2015-08-14 – 2015-08-19 (×5): 5 mg via ORAL
  Filled 2015-08-13 (×2): qty 2
  Filled 2015-08-13 (×3): qty 1

## 2015-08-13 MED ORDER — ONDANSETRON HCL 4 MG PO TABS: 4.0000 mg | ORAL_TABLET | Freq: Four times a day (QID) | ORAL | Status: DC | PRN

## 2015-08-13 MED ORDER — OXYBUTYNIN CHLORIDE 5 MG PO TABS
5.0000 mg | ORAL_TABLET | Freq: Every day | ORAL | Status: DC
  Administered 2015-08-14 – 2015-08-18 (×4): 5 mg via ORAL
  Filled 2015-08-13 (×7): qty 1

## 2015-08-13 MED ORDER — MORPHINE SULFATE (PF) 2 MG/ML IV SOLN
2.0000 mg | INTRAVENOUS | Status: DC | PRN
  Administered 2015-08-16: 2 mg via INTRAVENOUS
  Filled 2015-08-13: qty 1

## 2015-08-13 MED ORDER — ONDANSETRON HCL 4 MG/2ML IJ SOLN: 4.0000 mg | Freq: Four times a day (QID) | INTRAMUSCULAR | Status: DC | PRN

## 2015-08-13 MED ORDER — PANTOPRAZOLE SODIUM 40 MG PO TBEC
40.0000 mg | DELAYED_RELEASE_TABLET | Freq: Every evening | ORAL | Status: DC
  Administered 2015-08-13 – 2015-08-18 (×5): 40 mg via ORAL
  Filled 2015-08-13 (×5): qty 1

## 2015-08-13 MED ORDER — PRAVASTATIN SODIUM 20 MG PO TABS
40.0000 mg | ORAL_TABLET | Freq: Every evening | ORAL | Status: DC
  Administered 2015-08-13 – 2015-08-18 (×5): 40 mg via ORAL
  Filled 2015-08-13 (×5): qty 2

## 2015-08-13 MED ORDER — HYDROMORPHONE HCL 1 MG/ML IJ SOLN
1.0000 mg | Freq: Once | INTRAMUSCULAR | Status: AC
Start: 2015-08-13 — End: 2015-08-13
  Administered 2015-08-13: 1 mg via INTRAVENOUS

## 2015-08-13 MED ORDER — GABAPENTIN 600 MG PO TABS
1800.0000 mg | ORAL_TABLET | Freq: Two times a day (BID) | ORAL | Status: DC
  Administered 2015-08-13 – 2015-08-18 (×9): 1800 mg via ORAL
  Filled 2015-08-13 (×9): qty 3

## 2015-08-13 MED ORDER — HYDROMORPHONE HCL 1 MG/ML IJ SOLN
INTRAMUSCULAR | Status: AC
  Administered 2015-08-13: 1 mg via INTRAVENOUS
  Filled 2015-08-13: qty 1

## 2015-08-13 MED ORDER — LORAZEPAM 2 MG/ML IJ SOLN
INTRAMUSCULAR | Status: AC
Start: 2015-08-13 — End: 2015-08-13
  Administered 2015-08-13: 1 mg via INTRAVENOUS
  Filled 2015-08-13: qty 1

## 2015-08-13 MED ORDER — LORAZEPAM 2 MG/ML IJ SOLN
1.0000 mg | Freq: Once | INTRAMUSCULAR | Status: AC
  Administered 2015-08-13: 1 mg via INTRAVENOUS

## 2015-08-13 MED ORDER — DONEPEZIL HCL 5 MG PO TABS
10.0000 mg | ORAL_TABLET | Freq: Every day | ORAL | Status: DC
  Administered 2015-08-14 – 2015-08-19 (×5): 10 mg via ORAL
  Filled 2015-08-13 (×5): qty 2

## 2015-08-13 MED ORDER — RALOXIFENE HCL 60 MG PO TABS
60.0000 mg | ORAL_TABLET | Freq: Every day | ORAL | Status: DC
  Administered 2015-08-13 – 2015-08-19 (×6): 60 mg via ORAL
  Filled 2015-08-13 (×6): qty 1

## 2015-08-13 MED ORDER — POTASSIUM CHLORIDE ER 10 MEQ PO TBCR
10.0000 meq | EXTENDED_RELEASE_TABLET | Freq: Every day | ORAL | Status: DC
  Administered 2015-08-13 – 2015-08-19 (×6): 10 meq via ORAL
  Filled 2015-08-13 (×16): qty 1

## 2015-08-13 MED ORDER — SODIUM CHLORIDE 0.9 % IV SOLN
INTRAVENOUS | Status: DC
  Administered 2015-08-13 – 2015-08-15 (×3): via INTRAVENOUS

## 2015-08-13 MED ORDER — ACETAMINOPHEN 650 MG RE SUPP
650.0000 mg | Freq: Four times a day (QID) | RECTAL | Status: DC | PRN
Start: 2015-08-13 — End: 2015-08-16

## 2015-08-13 MED ORDER — MORPHINE SULFATE (PF) 2 MG/ML IV SOLN
INTRAVENOUS | Status: AC
  Filled 2015-08-13: qty 1

## 2015-08-13 NOTE — Progress Notes (Signed)
Spoke to Dr. Hyacinth Meeker about immobilizer. He wants a 6 in acewrap, knee immobilizer and polar care applied.

## 2015-08-13 NOTE — H&P (Signed)
[facility] - [place] at [facility]   PATIENT NAME: Jill House    MR#:  [id number]  DATE OF BIRTH:  29-Aug-1924  DATE OF ADMISSION:  08/13/2015  PRIMARY CARE PHYSICIAN: Margaretann Loveless, MD   REQUESTING/REFERRING PHYSICIAN: Dr. Minna Antis  CHIEF COMPLAINT:   Chief Complaint  Patient presents with  . Knee Injury   knee pain and noted to have a left distal femur fracture.  HISTORY OF PRESENT ILLNESS:  Jill House  is a 81 y.o. female with a known history of chronic atrial fibrillation, dementia, osteoarthritis, osteoporosis, hypertension, history of urinary incontinence, history of previous hip and knee replacement who presents to the hospital after a fall and noted to have a distal left femur fracture. As per the family who gives most of the history patient was apparently walking back from the bathroom when suddenly her left leg gave way and she went to the ground. She also apparently had an unwitnessed fall earlier this morning. She denies any head trauma or any loss of consciousness. She presented to the ER complaining of significant left knee pain and underwent an x-ray which showed a distal left femur fracture. Hospitalist services were contacted further treatment evaluation.  PAST MEDICAL HISTORY:   Past Medical History  Diagnosis Date  . Atrial fibrillation   . Dementia   . Hip joint replacement status   . Knee joint replacement status   . Osteoarthritis   . Hypertension   . Urinary incontinence     PAST SURGICAL HISTORY:   Past Surgical History  Procedure Laterality Date  . Replacement total knee Bilateral   . Knee surgery    . Hip replacment    . Total abdominal hysterectomy w/ bilateral salpingoophorectomy      SOCIAL HISTORY:   Social History  Substance Use Topics  . Smoking status: Never Smoker   . Smokeless tobacco: Not on file  . Alcohol Use: No    FAMILY HISTORY:   Family History  Problem Relation Age of  Onset  . CVA Sister   . Heart attack Brother     DRUG ALLERGIES:   Allergies  Allergen Reactions  . Keflex [Cephalexin] Other (See Comments)    Reaction:  Tremors   . Ciprofloxacin Rash and Swelling    REVIEW OF SYSTEMS:   Review of Systems  Constitutional: Negative for fever, chills and weight loss.  HENT: Negative for congestion, nosebleeds and tinnitus.   Eyes: Negative for blurred vision, double vision and redness.  Respiratory: Negative for cough, hemoptysis, shortness of breath and wheezing.   Cardiovascular: Negative for chest pain, orthopnea, leg swelling and PND.  Gastrointestinal: Negative for nausea, vomiting, abdominal pain, diarrhea and melena.  Genitourinary: Negative for dysuria, urgency and hematuria.  Musculoskeletal: Positive for joint pain (Left hip and knee). Negative for falls.  Neurological: Negative for dizziness, tingling, sensory change, focal weakness, seizures, weakness and headaches.  Endo/Heme/Allergies: Negative for polydipsia. Does not bruise/bleed easily.  Psychiatric/Behavioral: Negative for depression and memory loss. The patient is not nervous/anxious.   All other systems reviewed and are negative.   MEDICATIONS AT HOME:   Prior to Admission medications   Medication Sig Start Date End Date Taking? Authorizing Provider  apixaban (ELIQUIS) 2.5 MG TABS tablet Take 2.5 mg by mouth 2 (two) times daily.   Yes Historical Provider, MD  cholecalciferol (VITAMIN D) 1000 UNITS tablet Take 1,000 Units by mouth daily.   Yes Historical Provider, MD  cyanocobalamin (,VITAMIN B-12,) 1000 MCG/ML injection  Inject 1,000 mcg into the muscle every 30 (thirty) days.   Yes Historical Provider, MD  donepezil (ARICEPT) 10 MG tablet Take 10 mg by mouth daily.   Yes Historical Provider, MD  Fe Cbn-Fe Gluc-FA-B12-C-DSS (FERRALET 90) 90-1 MG TABS Take 1 tablet by mouth daily.   Yes Historical Provider, MD  furosemide (LASIX) 20 MG tablet Take 20 mg by mouth daily.    Yes  Historical Provider, MD  gabapentin (NEURONTIN) 600 MG tablet Take 1,800 mg by mouth 2 (two) times daily.   Yes Historical Provider, MD  memantine (NAMENDA) 5 MG tablet Take 5 mg by mouth daily.   Yes Historical Provider, MD  metoprolol succinate (TOPROL-XL) 100 MG 24 hr tablet Take 150 mg by mouth every evening.   Yes Historical Provider, MD  oxybutynin (DITROPAN) 5 MG tablet Take 5 mg by mouth daily at 2 PM daily at 2 PM.   Yes Historical Provider, MD  Oxycodone HCl 10 MG TABS Take 10 mg by mouth every 6 (six) hours.   Yes Historical Provider, MD  pantoprazole (PROTONIX) 40 MG tablet Take 40 mg by mouth every evening.   Yes Historical Provider, MD  potassium chloride (K-DUR) 10 MEQ tablet Take 10 mEq by mouth daily.   Yes Historical Provider, MD  pravastatin (PRAVACHOL) 40 MG tablet Take 40 mg by mouth every evening.   Yes Historical Provider, MD  raloxifene (EVISTA) 60 MG tablet Take 60 mg by mouth daily.   Yes Historical Provider, MD      VITAL SIGNS:  Blood pressure 119/87, pulse 101, temperature 97.6 F (36.4 C), resp. rate 28, height 5\' 4"  (1.626 m), weight 65.772 kg (145 lb), SpO2 94 %.  PHYSICAL EXAMINATION:  Physical Exam  GENERAL:  81 y.o.-year-old patient having myoclonic jerks lying in bed in mild distress.  EYES: Pupils equal, round, reactive to light and accommodation. No scleral icterus. Extraocular muscles intact.  HEENT: Head atraumatic, normocephalic. Oropharynx and nasopharynx clear. No oropharyngeal erythema, dry oral mucosa  NECK:  Supple, no jugular venous distention. No thyroid enlargement, no tenderness.  LUNGS: Normal breath sounds bilaterally, no wheezing, rales, rhonchi. No use of accessory muscles of respiration.  CARDIOVASCULAR: S1, S2 irregular. No murmurs, rubs, gallops, clicks.  ABDOMEN: Soft, nontender, nondistended. Bowel sounds present. No organomegaly or mass.  EXTREMITIES: +1 pedal edema b/l, No cyanosis, or clubbing. + 2 pedal & radial pulses b/l.    NEUROLOGIC: Cranial nerves II through XII are intact. No focal Motor or sensory deficits appreciated b/l.  Myoclonic jerks PSYCHIATRIC: The patient is alert and oriented x 1. SKIN: No obvious rash, lesion, or ulcer.   LABORATORY PANEL:   CBC  Recent Labs Lab 08/13/15 1335  WBC 7.7  HGB 12.1  HCT 36.1  PLT 109*   ------------------------------------------------------------------------------------------------------------------  Chemistries   Recent Labs Lab 08/13/15 1335  NA 143  K 4.3  CL 103  CO2 30  GLUCOSE 159*  BUN 26*  CREATININE 1.55*  CALCIUM 9.0  AST 32  ALT 14  ALKPHOS 64  BILITOT 0.8   ------------------------------------------------------------------------------------------------------------------  Cardiac Enzymes No results for input(s): TROPONINI in the last 168 hours. ------------------------------------------------------------------------------------------------------------------  RADIOLOGY:  Dg Knee 1-2 Views Left  08/13/2015   CLINICAL DATA:  Fall, left knee pain  EXAM: LEFT KNEE - 1-2 VIEW  COMPARISON:  None.  FINDINGS: Two views of the left knee submitted. There is displaced mild comminuted fracture distal shaft of left femur. Left knee prosthesis with anatomic alignment. No evidence of loosening  of prosthesis.  IMPRESSION: Displaced mild comminuted fracture distal shaft of left femur. Left knee prosthesis with anatomic alignment.   Electronically Signed   By: Natasha Mead M.D.   On: 08/13/2015 14:41   Dg Chest Portable 1 View  08/13/2015   CLINICAL DATA:  Left knee pain, status post fall. Atrial fibrillation.  EXAM: PORTABLE CHEST - 1 VIEW  COMPARISON:  11/30/2014  FINDINGS: Mild cardiomegaly. Lungs are clear. No effusions or edema. No acute bony abnormality or pneumothorax.  IMPRESSION: Cardiomegaly.  No active disease.   Electronically Signed   By: Charlett Nose M.D.   On: 08/13/2015 14:35     IMPRESSION AND PLAN:   81 year old female with past  medical history of dementia, hypertension, urinary incontinence, history of osteoporosis, history of knee and hip replacement who presents to the hospital with left knee pain and noted to have a distal left femur fracture.  #1 preoperative medical evaluation-patient was likely a moderate risk for noncardiac surgery. -We'll hold Eliquis and patient likely stable for surgery in the next 24 hours. -Continue perioperative beta blocker.  #2 left hip fracture-patient has a left distal femur fracture. Continue supportive care with pain control with IV morphine. -We will consult orthopedics regarding further care.  #3 chronic afibrillation-rate controlled and will continue metoprolol. -Hold Eliquis as patient likely to need surgery soon.  #4 dementia without behavioral disturbance-continue Namenda, Aricept.  #5 hyperlipidemia-continue Pravachol.  #6 GERD-continue Protonix.  #7 urinary incontinence-continue oxybutynin.  Patient's healthcare power of attorney is Mr. Addison Bailey his phone number is [phone].  I confirmed with him that the patient is actually a DO NOT RESUSCITATE  All the records are reviewed and case discussed with ED provider. Management plans discussed with the patient, family and they are in agreement.  CODE STATUS: DO NOT RESUSCITATE  TOTAL TIME TAKING CARE OF THIS PATIENT: 50 minutes.    Houston Siren M.D on 08/13/2015 at 3:18 PM  Between 7am to 6pm - Pager - [phone]  After 6pm go to [email or web address] - password [email or web address]  [facility]  Office  [phone]  CC: Primary care physician; Margaretann Loveless, MD

## 2015-08-13 NOTE — Plan of Care (Signed)
Problem: Phase I Progression Outcomes Goal: Pain controlled with appropriate interventions Outcome: Progressing Requesting pain meds as needed. Pt states only has pain with movement

## 2015-08-13 NOTE — Progress Notes (Signed)
Pt received from emergency with left fx femur polar care ace and immobizer applied to left leg, pt medicated for pain, yellow necklace and bracelet given to daughter, rounds by Dr Hyacinth Meeker  Medications adjusted for pain controll family at bedside

## 2015-08-13 NOTE — ED Provider Notes (Signed)
[facility] Emergency Department Provider Note  Time seen: 1:56 PM  I have reviewed the triage vital signs and the nursing notes.   HISTORY  Chief Complaint Knee Injury    HPI Jill House is a 81 y.o. female with a past medical history of atrial fibrillation on Eliquis, dementia, chronic pain, presents to the emergency department with left knee pain. According to the patient's family she was recently diagnosed with a pneumonia and started on Keflex yesterday. Today she was noted to be very tremulous, and while walking her left knee gave out and she hit it on the front of her walker. She described mild pain at that time, sat down and watching TV, after some time she stood back up and her left knee gave out again this time she fell forwards onto the left knee. Denies any head injury or head impact. Patient now with severe left knee pain. Patient noted to have an oxygen saturation of 86% on room air on arrival. Family does note recent cough over the last several days which is what prompted her visit to her primary care doctor yesterday who prescribed Keflex for possible pneumonia.She describes her left knee pain as severe, worse with any type of movement. Warm distally. Sensation intact.   Past Medical History  Diagnosis Date  . Atrial fibrillation   . Dementia     There are no active problems to display for this patient.   Past Surgical History  Procedure Laterality Date  . Replacement total knee Bilateral     Current Outpatient Rx  Name  Route  Sig  Dispense  Refill  . AMANTADINE HCL PO   Oral   Take by mouth daily.         Marland Kitchen apixaban (ELIQUIS) 2.5 MG TABS tablet   Oral   Take 2.5 mg by mouth 2 (two) times daily.         . clindamycin (CLEOCIN) 150 MG capsule   Oral   Take 1 capsule (150 mg total) by mouth 3 (three) times daily.   30 capsule   0   . gabapentin (NEURONTIN) 600 MG tablet   Oral   Take 600 mg by mouth.         .  hydrochlorothiazide (HYDRODIURIL) 25 MG tablet   Oral   Take 25 mg by mouth.         . metoprolol succinate (TOPROL-XL) 50 MG 24 hr tablet   Oral   Take 50 mg by mouth.         . OxyCODONE (OXYCONTIN) 10 mg T12A 12 hr tablet   Oral   Take 10 mg by mouth.         . potassium chloride 10 MEQ/100ML   Intravenous   Inject 10 mEq into the vein daily.         . pravastatin (PRAVACHOL) 40 MG tablet   Oral   Take 40 mg by mouth.         . raloxifene (EVISTA) 60 MG tablet   Oral   Take 60 mg by mouth daily.           Allergies Keflex and Ciprofloxacin  History reviewed. No pertinent family history.  Social History Social History  Substance Use Topics  . Smoking status: Never Smoker   . Smokeless tobacco: None  . Alcohol Use: No    Review of Systems Constitutional: Negative for fever. Cardiovascular: Negative for chest pain. Respiratory: Negative for shortness of breath. Positive  for cough. Gastrointestinal: Negative for abdominal pain, vomiting and diarrhea. Musculoskeletal: Negative for back pain. Positive for significant left knee pain. Skin: Negative for rash. Mild ecchymosis of the left knee. Neurological: Negative for headaches, focal weakness or numbness. 10-point ROS otherwise negative.  ____________________________________________   PHYSICAL EXAM:  VITAL SIGNS: ED Triage Vitals  Enc Vitals Group     BP 08/13/15 1341 119/87 mmHg     Pulse Rate 08/13/15 1341 101     Resp 08/13/15 1341 28     Temp 08/13/15 1341 97.6 F (36.4 C)     Temp src --      SpO2 08/13/15 1336 90 %     Weight 08/13/15 1341 145 lb (65.772 kg)     Height 08/13/15 1341  (1.626 m)     Head Cir --      Peak Flow --      Pain Score 08/13/15 1342 9     Pain Loc --      Pain Edu? --      Excl. in GC? --     Constitutional: Alert and oriented. Moderate distress due to left knee pain. Patient does appear somewhat tremulous likely due to pain. Eyes: Normal  exam ENT   Head: Normocephalic and atraumatic   Mouth/Throat: Mucous membranes are moist. Cardiovascular: Normal rate, regular rhythm.  Respiratory: Normal respiratory effort without tachypnea nor retractions. Breath sounds are clear and equal bilaterally. No wheezes/rales/rhonchi. Gastrointestinal: Soft and nontender. No distention.  Musculoskeletal: Moderate swelling, mild ecchymosis of the left knee. Significant tenderness to palpation especially with any type of movement. Warm distally, sensation intact. Neurologic:  Normal speech and language. No gross focal neurologic deficits  Skin:  Skin is warm, dry and intact.  Psychiatric: Mood and affect are normal. Speech and behavior are normal. ____________________________________________   RADIOLOGY  Positive femur fracture. Chest x-ray negative.  ____________________________________________    INITIAL IMPRESSION / ASSESSMENT AND PLAN / ED COURSE  Pertinent labs & imaging results that were available during my care of the patient were reviewed by me and considered in my medical decision making (see chart for details).  Patient with significant left knee pain status post a fall. Patient also with a recent pneumonia diagnosis, currently 86% on room air, has no home O2 requirement. We will get x-rays, treat pain, monitor the patient very closely in the emergency department. We will obtain a chest x-ray as well as labs to help further workup per hypoxia.   Positive femur fracture. Chest x-ray negative. Patient is a complicated patient given atrial fibrillation on Eliquis, with mild hypoxemia currently in the emergency department. I discussed patient with Dr. Hyacinth Meeker. We will admit to medicine for medical workup including clearance for likely surgery on Friday. ____________________________________________   FINAL CLINICAL IMPRESSION(S) / ED DIAGNOSES  Left knee pain Fall Hypoxia Distal Femur fracture    Minna Antis,  MD 08/13/15 1454

## 2015-08-13 NOTE — Progress Notes (Signed)
Patient in good spirits. Denies pain at present. Vital signs fluctuating. Unable to verify true pressures. Patients limbs continue to jerk. Pt responds to verbal commands, able to answer questions. In no apparent distress. Will continue to assess for pain and safety.

## 2015-08-13 NOTE — Consult Note (Signed)
ORTHOPAEDIC CONSULTATION  REQUESTING PHYSICIAN: Houston Siren, MD  Chief Complaint: Left thigh pain  HPI: Jill House is a 81 y.o. female who complains of  left thigh pain following a fall at home earlier today. The patient was returning from the bathroom when she fell. She was brought to the emergency room at [facility] where exam and x-rays revealed a comminuted spiral fracture of the left distal femur. There is a total knee replacement just below this. She also has had left hip surgery by report. The hardware is not visible and new x-rays will be taken for this. Patient is on Eliquis for atrial fibrillation. I have discussed treatment with the patient and her niece. Surgical fixation of the fracture would enable her to be mobilized more quickly and with less pain and reduce the risk of complications. The risks and benefits of major surgery were discussed with him as well. Plating of the femur will require an extensile incision with significant blood loss. I would like to wait 36 hours to let the Eliquis dissipate before undertaking this. We will plan on surgery late Friday morning. Patient has been on high doses of oxycodone and gabapentin at home for chronic back problems. We will try to control her pain in light of this.  Past Medical History  Diagnosis Date  . Atrial fibrillation   . Dementia   . Hip joint replacement status   . Knee joint replacement status   . Osteoarthritis   . Hypertension   . Urinary incontinence    Past Surgical History  Procedure Laterality Date  . Replacement total knee Bilateral   . Knee surgery    . Hip replacment    . Total abdominal hysterectomy w/ bilateral salpingoophorectomy     Social History   Social History  . Marital Status: Widowed    Spouse Name: N/A  . Number of Children: N/A  . Years of Education: N/A   Social History Main Topics  . Smoking status: Never Smoker   . Smokeless tobacco: None  . Alcohol  Use: No  . Drug Use: None  . Sexual Activity: Not Asked   Other Topics Concern  . None   Social History Narrative   Family History  Problem Relation Age of Onset  . CVA Sister   . Heart attack Brother    Allergies  Allergen Reactions  . Keflex [Cephalexin] Other (See Comments)    Reaction:  Tremors   . Ciprofloxacin Rash and Swelling   Prior to Admission medications   Medication Sig Start Date End Date Taking? Authorizing Provider  apixaban (ELIQUIS) 2.5 MG TABS tablet Take 2.5 mg by mouth 2 (two) times daily.   Yes Historical Provider, MD  cholecalciferol (VITAMIN D) 1000 UNITS tablet Take 1,000 Units by mouth daily.   Yes Historical Provider, MD  cyanocobalamin (,VITAMIN B-12,) 1000 MCG/ML injection Inject 1,000 mcg into the muscle every 30 (thirty) days.   Yes Historical Provider, MD  donepezil (ARICEPT) 10 MG tablet Take 10 mg by mouth daily.   Yes Historical Provider, MD  Fe Cbn-Fe Gluc-FA-B12-C-DSS (FERRALET 90) 90-1 MG TABS Take 1 tablet by mouth daily.   Yes Historical Provider, MD  furosemide (LASIX) 20 MG tablet Take 20 mg by mouth daily.    Yes Historical Provider, MD  gabapentin (NEURONTIN) 600 MG tablet Take 1,800 mg by mouth 2 (two) times daily.   Yes Historical Provider, MD  memantine (NAMENDA) 5 MG tablet Take 5 mg by mouth daily.  Yes Historical Provider, MD  metoprolol succinate (TOPROL-XL) 100 MG 24 hr tablet Take 150 mg by mouth every evening.   Yes Historical Provider, MD  oxybutynin (DITROPAN) 5 MG tablet Take 5 mg by mouth daily at 2 PM daily at 2 PM.   Yes Historical Provider, MD  Oxycodone HCl 10 MG TABS Take 10 mg by mouth every 6 (six) hours.   Yes Historical Provider, MD  pantoprazole (PROTONIX) 40 MG tablet Take 40 mg by mouth every evening.   Yes Historical Provider, MD  potassium chloride (K-DUR) 10 MEQ tablet Take 10 mEq by mouth daily.   Yes Historical Provider, MD  pravastatin (PRAVACHOL) 40 MG tablet Take 40 mg by mouth every evening.   Yes  Historical Provider, MD  raloxifene (EVISTA) 60 MG tablet Take 60 mg by mouth daily.   Yes Historical Provider, MD   Dg Knee 1-2 Views Left  08/13/2015   CLINICAL DATA:  Fall, left knee pain  EXAM: LEFT KNEE - 1-2 VIEW  COMPARISON:  None.  FINDINGS: Two views of the left knee submitted. There is displaced mild comminuted fracture distal shaft of left femur. Left knee prosthesis with anatomic alignment. No evidence of loosening of prosthesis.  IMPRESSION: Displaced mild comminuted fracture distal shaft of left femur. Left knee prosthesis with anatomic alignment.   Electronically Signed   By: Natasha Mead M.D.   On: 08/13/2015 14:41   Dg Chest Portable 1 View  08/13/2015   CLINICAL DATA:  Left knee pain, status post fall. Atrial fibrillation.  EXAM: PORTABLE CHEST - 1 VIEW  COMPARISON:  11/30/2014  FINDINGS: Mild cardiomegaly. Lungs are clear. No effusions or edema. No acute bony abnormality or pneumothorax.  IMPRESSION: Cardiomegaly.  No active disease.   Electronically Signed   By: Charlett Nose M.D.   On: 08/13/2015 14:35    Positive ROS: All other systems have been reviewed and were otherwise negative with the exception of those mentioned in the HPI and as above.  Physical Exam: General: Alert, no acute distress Cardiovascular: No pedal edema Respiratory: No cyanosis, no use of accessory musculature GI: No organomegaly, abdomen is soft and non-tender Skin: No lesions in the area of chief complaint Neurologic: Sensation intact distally Psychiatric: Patient is competent for consent with normal mood and affect Lymphatic: No axillary or cervical lymphadenopathy  MUSCULOSKELETAL: The left leg is tender and swollen around the knee. She has a healed long anterior midline total knee incision. There is pain with motion at the fracture site. The skin is intact. 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 Sensation is intact. No other orthopedic injuries are  noted.  Assessment: Displaced comminuted spiral left distal femur fracture  Plan: Open reduction internal fixation left distal femur in 36 hours.    Valinda Hoar, MD [phone]   08/13/2015 6:15 PM

## 2015-08-13 NOTE — ED Notes (Addendum)
EMS states that pt was walking with walker and her left knee gave out and pt fell and struck left knee on walker. Pt is also complaining of abdominal pain. Pt was given of Fentanyl and 5 mg of Zofran with no relief.

## 2015-08-14 LAB — PREPARE RBC (CROSSMATCH)

## 2015-08-14 LAB — BASIC METABOLIC PANEL
ANION GAP: 7 (ref 5–15)
BUN: 33 mg/dL — ABNORMAL HIGH (ref 6–20)
CALCIUM: 7.8 mg/dL — AB (ref 8.9–10.3)
CHLORIDE: 108 mmol/L (ref 101–111)
CO2: 27 mmol/L (ref 22–32)
Creatinine, Ser: 2.44 mg/dL — ABNORMAL HIGH (ref 0.44–1.00)
GFR calc non Af Amer: 16 mL/min — ABNORMAL LOW (ref >60)
GFR, EST AFRICAN AMERICAN: 19 mL/min — AB (ref >60)
Glucose, Bld: 142 mg/dL — ABNORMAL HIGH (ref 65–99)
Potassium: 4.8 mmol/L (ref 3.5–5.1)
Sodium: 142 mmol/L (ref 135–145)

## 2015-08-14 LAB — CBC
HCT: 24.3 % — ABNORMAL LOW (ref 35.0–47.0)
HEMOGLOBIN: 8 g/dL — AB (ref 12.0–16.0)
MCH: 30.5 pg (ref 26.0–34.0)
MCHC: 32.8 g/dL (ref 32.0–36.0)
MCV: 93 fL (ref 80.0–100.0)
Platelets: 83 10*3/uL — ABNORMAL LOW (ref 150–440)
RBC: 2.61 MIL/uL — AB (ref 3.80–5.20)
RDW: 15.1 % — ABNORMAL HIGH (ref 11.5–14.5)
WBC: 11.3 10*3/uL — AB (ref 3.6–11.0)

## 2015-08-14 LAB — HEMOGLOBIN: HEMOGLOBIN: 7.9 g/dL — AB (ref 12.0–16.0)

## 2015-08-14 LAB — ABO/RH: ABO/RH(D): A POS

## 2015-08-14 MED ORDER — DOCUSATE SODIUM 100 MG PO CAPS
100.0000 mg | ORAL_CAPSULE | Freq: Two times a day (BID) | ORAL | Status: DC
  Administered 2015-08-14 – 2015-08-19 (×10): 100 mg via ORAL
  Filled 2015-08-14 (×10): qty 1

## 2015-08-14 MED ORDER — LORAZEPAM 1 MG PO TABS: 1.0000 mg | ORAL_TABLET | ORAL | Status: DC | PRN

## 2015-08-14 MED ORDER — SODIUM CHLORIDE 0.9 % IV SOLN
Freq: Once | INTRAVENOUS | Status: AC
Start: 2015-08-14 — End: 2015-08-14
  Administered 2015-08-14: 19:00:00 via INTRAVENOUS

## 2015-08-14 MED ORDER — VANCOMYCIN HCL IN DEXTROSE 1-5 GM/200ML-% IV SOLN
1000.0000 mg | Freq: Once | INTRAVENOUS | Status: AC
  Administered 2015-08-15: 1000 mg via INTRAVENOUS
  Filled 2015-08-14: qty 200

## 2015-08-14 NOTE — Progress Notes (Signed)
Patient is cooperative, takes meds whole.  Complains of pain that is being managed by PO meds.  Plan for patient to be transfused 1 unit of blood today and plan for surgery tomorrow around noon per Dr. Hyacinth Meeker.

## 2015-08-14 NOTE — Care Management (Signed)
Met briefly with patient but she was requesting to get off the bedpan. Patient pending surgical intervention Friday- delay due to Eliquis. Patient states her niece Clarise Cruz will be assisting with her discharge. Clarise Cruz "had just stepped out". Patient is currently on O2. List of home health agencies left at bedside with this RNCM contact number. RNCM will continue to follow.

## 2015-08-14 NOTE — Progress Notes (Signed)
Paged Dr. Renae Gloss concerned about patients decreased urine output, advised fluids running and he stated he was aware of her elevated BUN/Crt and low hemoglobin.  Advised that patient normally takes Lasix 20 mgs at home but this has not been started as of yet.   Advised to continue fluids and he is rounding on patient later and he will address.

## 2015-08-14 NOTE — Progress Notes (Signed)
Subjective:   Procedure(s) (LRB): OPEN REDUCTION INTERNAL FIXATION (ORIF) DISTAL FEMUR FRACTURE (Left)    Patient reports pain as moderate.  Discussed surgery with patient and niece again for tomorrow.  Plan surgery around noon.     Objective:   VITALS:   Filed Vitals:   08/14/15 1535  BP: 140/112  Pulse: 105  Temp: 98.3 F (36.8 C)  Resp: 18    Neurovascular intact Sensation intact distally Intact pulses distally Bruises on upper arms.  Good rom and no evidence of fracture  LABS  Recent Labs  08/13/15 1335 08/14/15 0559 08/14/15 1146  HGB 12.1 8.0* 7.9*  HCT 36.1 24.3*  --   WBC 7.7 11.3*  --   PLT 109* 83*  --      Recent Labs  08/13/15 1335 08/14/15 0559  NA 143 142  K 4.3 4.8  BUN 26* 33*  CREATININE 1.55* 2.44*  GLUCOSE 159* 142*     Recent Labs  08/13/15 1335  INR 1.30     Assessment/Plan:   Procedure(s) (LRB): OPEN REDUCTION INTERNAL FIXATION (ORIF) DISTAL FEMUR FRACTURE (Left)   Plan ORIF tomorrow

## 2015-08-14 NOTE — Clinical Social Work Note (Signed)
Clinical Social Work Assessment  Patient Details  Name: Jill House MRN: [id number] Date of Birth: 11/28/1924  Date of referral:  08/14/15               Reason for consult:  Facility Placement                Permission sought to share information with:  Facility Medical sales representative, Family Supports Permission granted to share information::  Yes, Verbal Permission Granted  Name::      (niece Maralyn Sago  [phone]  or [phone])  Agency::   (SNFbed search)  Relationship::     Contact Information:     Housing/Transportation Living arrangements for the past 2 months:  Single Family Home Source of Information:  Patient, Other (Comment Required) (niece) Patient Interpreter Needed:  None Criminal Activity/Legal Involvement Pertinent to Current Situation/Hospitalization:  No - Comment as needed Significant Relationships:  Other Family Members, Pets Lives with:  Relatives (Niece) Do you feel safe going back to the place where you live?  Yes Need for family participation in patient care:  Yes (Comment)  Care giving concerns:  No concerns reported   Office manager / plan:  Patient is 81 year old female, that reported to [facility] after a fall.  Patient is pleasant and engaged in conversation with CSW, Niece is at bedside providing information.  Patient moved from [place] about two years ago.  Now lives in an attached suite to niece's home.  Prior to fall patient only used her walker when she went out shopping.  Patient has no children, HPOA is Kristeen Miss (nephew) [phone].  Patient states her niece Maralyn Sago will assist her with making decisions.   Patient and niece would like CSW to start bed search for SNF.  First choice is [facility]	[place].  CSW will continue to follow patient for ongoing and discharge needs, will complete FL2 in anticipation of patient discharging to SNF when medically ready.   Employment status:  Retired Public affairs consultant) Health and safety inspector:    PT  Recommendations:  Not assessed at this time Information / Referral to community resources:  Skilled Nursing Facility  Patient/Family's Response to care:  Patient and niece are in agreement.  Patient/Family's Understanding of and Emotional Response to Diagnosis, Current Treatment, and Prognosis:  Patient and family understand patient will remain at [facility] until medically cleared by MD then will discharge to SNF for rehab.  Emotional Assessment Appearance:  Appears younger than stated age Attitude/Demeanor/Rapport:    Affect (typically observed):  Accepting, Appropriate, Calm, Pleasant Orientation:  Oriented to Self, Oriented to Place, Oriented to  Time, Oriented to Situation Alcohol / Substance use:  Never Used Psych involvement (Current and /or in the community):  No (Comment) (none per patient and niece)  Discharge Needs  Concerns to be addressed:  No discharge needs identified Readmission within the last 30 days:  No Current discharge risk:  Dependent with Mobility Barriers to Discharge:  No Barriers Identified   Soundra Pilon, LCSW 08/14/2015, 12:28 PM Sammuel Hines. Theresia Majors, MSW Clinical Social Work Department Emergency Room [phone] 12:31 PM

## 2015-08-14 NOTE — Progress Notes (Signed)
Patient ID: Jill House, female   DOB: August 04, 1924, 81 y.o.   MRN: [id number] [facility] PROGRESS NOTE  PCP: Margaretann Loveless, MD  HPI/Subjective: Patient feeling okay. After fall found to have a left femur fracture.  Objective: Filed Vitals:   08/14/15 0819  BP: 136/66  Pulse: 118  Temp: 98.1 F (36.7 C)  Resp: 18    Intake/Output Summary (Last 24 hours) at 08/14/15 1140 Last data filed at 08/14/15 1007  Gross per 24 hour  Intake   1651 ml  Output    255 ml  Net   1396 ml   Filed Weights   08/13/15 1341 08/13/15 1710  Weight: 65.772 kg (145 lb) 71.169 kg (156 lb 14.4 oz)    ROS: Review of Systems  Constitutional: Negative for fever and chills.  Eyes: Negative for blurred vision.  Respiratory: Negative for cough and shortness of breath.   Cardiovascular: Negative for chest pain.  Gastrointestinal: Negative for nausea, vomiting, abdominal pain, diarrhea and constipation.  Musculoskeletal: Positive for joint pain and falls.  Neurological: Negative for dizziness and headaches.   Exam: Physical Exam  HENT:  Nose: No mucosal edema.  Mouth/Throat: No oropharyngeal exudate or posterior oropharyngeal edema.  Eyes: EOM and lids are normal. Pupils are equal, round, and reactive to light.  Conjunctiva pale  Neck: No JVD present. Carotid bruit is not present. No edema present. No thyroid mass and no thyromegaly present.  Cardiovascular: S1 normal and S2 normal.  An irregularly irregular rhythm present. Exam reveals no gallop.   Murmur heard.  Systolic murmur is present with a grade of 2/6  Pulses:      Dorsalis pedis pulses are 2+ on the right side, and 2+ on the left side.  Respiratory: No respiratory distress. She has no wheezes. She has no rhonchi. She has no rales.  GI: Soft. Bowel sounds are normal. There is no tenderness.  Genitourinary:  Foley  Musculoskeletal:       Right shoulder: She exhibits no swelling.  Lymphadenopathy:    She has no  cervical adenopathy.  Neurological: She is alert. No cranial nerve deficit.  Skin: Skin is warm. No rash noted. Nails show no clubbing.  The patient's left leg is wrapped with Ace bandage, ice and large immobilizer. I took off the immobilizer and tried to look under the Ace bandage. I did not see any bruising or hematoma there.  Psychiatric: She has a normal mood and affect.    Data Reviewed: Basic Metabolic Panel:  Recent Labs Lab 08/13/15 1335 08/14/15 0559  NA 143 142  K 4.3 4.8  CL 103 108  CO2 30 27  GLUCOSE 159* 142*  BUN 26* 33*  CREATININE 1.55* 2.44*  CALCIUM 9.0 7.8*   Liver Function Tests:  Recent Labs Lab 08/13/15 1335  AST 32  ALT 14  ALKPHOS 64  BILITOT 0.8  PROT 7.1  ALBUMIN 3.9   CBC:  Recent Labs Lab 08/13/15 1335 08/14/15 0559  WBC 7.7 11.3*  HGB 12.1 8.0*  HCT 36.1 24.3*  MCV 92.3 93.0  PLT 109* 83*     Recent Results (from the past 240 hour(s))  MRSA PCR Screening     Status: None   Collection Time: 08/13/15  3:45 PM  Result Value Ref Range Status   MRSA by PCR NEGATIVE NEGATIVE Final    Comment:        The GeneXpert MRSA Assay (FDA approved for NASAL specimens only), is one component of a comprehensive  MRSA colonization surveillance program. It is not intended to diagnose MRSA infection nor to guide or monitor treatment for MRSA infections.      Studies: Dg Knee 1-2 Views Left  2015/08/18   CLINICAL DATA:  Fall, left knee pain  EXAM: LEFT KNEE - 1-2 VIEW  COMPARISON:  None.  FINDINGS: Two views of the left knee submitted. There is displaced mild comminuted fracture distal shaft of left femur. Left knee prosthesis with anatomic alignment. No evidence of loosening of prosthesis.  IMPRESSION: Displaced mild comminuted fracture distal shaft of left femur. Left knee prosthesis with anatomic alignment.   Electronically Signed   By: Natasha Mead M.D.   On: 08/18/2015 14:41   Dg Chest Portable 1 View  2015/08/18   CLINICAL DATA:  Left  knee pain, status post fall. Atrial fibrillation.  EXAM: PORTABLE CHEST - 1 VIEW  COMPARISON:  11/30/2014  FINDINGS: Mild cardiomegaly. Lungs are clear. No effusions or edema. No acute bony abnormality or pneumothorax.  IMPRESSION: Cardiomegaly.  No active disease.   Electronically Signed   By: Charlett Nose M.D.   On: 08/18/2015 14:35    Scheduled Meds: . cholecalciferol  1,000 Units Oral Daily  . cyanocobalamin  1,000 mcg Intramuscular Q30 days  . donepezil  10 mg Oral Daily  . ferrous fumarate  1 tablet Oral Daily  . gabapentin  1,800 mg Oral BID  . memantine  5 mg Oral Daily  . metoprolol succinate  150 mg Oral QPM  . oxybutynin  5 mg Oral Q1400  . pantoprazole  40 mg Oral QPM  . potassium chloride  10 mEq Oral Daily  . pravastatin  40 mg Oral QPM  . raloxifene  60 mg Oral Daily   Continuous Infusions: . sodium chloride 75 mL/hr at 08/14/15 1008    Assessment/Plan:  1. Anemia unspecified. Likely a combination of history of anemia ( pernicious anemia), dehydration, dilutional, and being on Eliquis with a fracture. Check a stat hemoglobin and see if we need to transfuse today. Likely will end up needing a transfusion at some point.  2. Acute on chronic renal failure- continue IV fluids and hold Lasix at this point 3. Atrial fibrillation- off Eliquis at this time with surgery planned. Rate control with metoprolol. Higher risk of stroke being off Eliquis. 4. Essential hypertension- blood pressure up and down likely with pain. 5. Gastroesophageal reflux disease without esophagitis continue Protonix 6. Hyperlipidemia unspecified continue pravastatin 7. History of dementia on Namenda 8. Distal left femur fracture- surgery to be done once Eliquis out of system.  Code Status:     Code Status Orders        Start     Ordered   18-Aug-2015 1532  Do not attempt resuscitation (DNR)   Continuous    Question Answer Comment  In the event of cardiac or respiratory ARREST Do not call a "code  blue"   In the event of cardiac or respiratory ARREST Do not perform Intubation, CPR, defibrillation or ACLS   In the event of cardiac or respiratory ARREST Use medication by any route, position, wound care, and other measures to relive pain and suffering. May use oxygen, suction and manual treatment of airway obstruction as needed for comfort.      Aug 18, 2015 1531    Advance Directive Documentation        Most Recent Value   Type of Advance Directive  Healthcare Power of Attorney   Pre-existing out of facility DNR order (yellow form  or pink MOST form)     "MOST" Form in Place?       Family Communication: niece at bedside  Disposition Plan: rehab 3 days post op  Consultants:  othopedic surgery  Time spent:  Alford Highland  [facility]

## 2015-08-15 ENCOUNTER — Inpatient Hospital Stay: Payer: Medicare Other | Admitting: Anesthesiology

## 2015-08-15 ENCOUNTER — Encounter
Admission: EM | Disposition: A | Discharge status: Skilled Nursing Facility | Payer: Self-pay | Source: Home / Self Care | Attending: Internal Medicine

## 2015-08-15 ENCOUNTER — Inpatient Hospital Stay: Payer: Medicare Other

## 2015-08-15 ENCOUNTER — Encounter
Admission: RE | Admit: 2015-08-15 | Discharge: 2015-08-15 | Disposition: A | Discharge status: Home / Self Care | Payer: Medicare Other | Source: Ambulatory Visit | Attending: Internal Medicine | Admitting: Internal Medicine

## 2015-08-15 ENCOUNTER — Encounter: Payer: Self-pay | Admitting: Anesthesiology

## 2015-08-15 DIAGNOSIS — D649 Anemia, unspecified: Secondary | ICD-10-CM | POA: Insufficient documentation

## 2015-08-15 HISTORY — PX: ORIF FEMUR FRACTURE: SHX2119

## 2015-08-15 LAB — CBC
HCT: 25.5 % — ABNORMAL LOW (ref 35.0–47.0)
Hemoglobin: 8.5 g/dL — ABNORMAL LOW (ref 12.0–16.0)
MCH: 30.3 pg (ref 26.0–34.0)
MCHC: 33.2 g/dL (ref 32.0–36.0)
MCV: 91.2 fL (ref 80.0–100.0)
PLATELETS: 60 10*3/uL — AB (ref 150–440)
RBC: 2.79 MIL/uL — ABNORMAL LOW (ref 3.80–5.20)
RDW: 15.5 % — ABNORMAL HIGH (ref 11.5–14.5)
WBC: 11.1 10*3/uL — ABNORMAL HIGH (ref 3.6–11.0)

## 2015-08-15 LAB — HEMOGLOBIN: HEMOGLOBIN: 8.4 g/dL — AB (ref 12.0–16.0)

## 2015-08-15 LAB — PLATELET COUNT: PLATELETS: 66 10*3/uL — AB (ref 150–440)

## 2015-08-15 LAB — BASIC METABOLIC PANEL
Anion gap: 4 — ABNORMAL LOW (ref 5–15)
BUN: 34 mg/dL — ABNORMAL HIGH (ref 6–20)
CALCIUM: 8.1 mg/dL — AB (ref 8.9–10.3)
CO2: 27 mmol/L (ref 22–32)
CREATININE: 2.02 mg/dL — AB (ref 0.44–1.00)
Chloride: 108 mmol/L (ref 101–111)
GFR calc non Af Amer: 21 mL/min — ABNORMAL LOW (ref >60)
GFR, EST AFRICAN AMERICAN: 24 mL/min — AB (ref >60)
GLUCOSE: 133 mg/dL — AB (ref 65–99)
Potassium: 4.7 mmol/L (ref 3.5–5.1)
Sodium: 139 mmol/L (ref 135–145)

## 2015-08-15 SURGERY — OPEN REDUCTION INTERNAL FIXATION (ORIF) DISTAL FEMUR FRACTURE
Anesthesia: General | Laterality: Left

## 2015-08-15 MED ORDER — FUROSEMIDE 10 MG/ML IJ SOLN
40.0000 mg | Freq: Once | INTRAMUSCULAR | Status: AC
  Administered 2015-08-16: 40 mg via INTRAVENOUS
  Filled 2015-08-15: qty 4

## 2015-08-15 MED ORDER — ACETAMINOPHEN 10 MG/ML IV SOLN
INTRAVENOUS | Status: AC
  Filled 2015-08-15: qty 100

## 2015-08-15 MED ORDER — ONDANSETRON HCL 4 MG/2ML IJ SOLN: 4.0000 mg | Freq: Once | INTRAMUSCULAR | Status: DC | PRN

## 2015-08-15 MED ORDER — LACTATED RINGERS IV SOLN
INTRAVENOUS | Status: DC | PRN
  Administered 2015-08-15: 13:00:00 via INTRAVENOUS

## 2015-08-15 MED ORDER — FENTANYL CITRATE (PF) 100 MCG/2ML IJ SOLN
INTRAMUSCULAR | Status: DC | PRN
  Administered 2015-08-15: 50 ug via INTRAVENOUS

## 2015-08-15 MED ORDER — PROPOFOL 10 MG/ML IV BOLUS
INTRAVENOUS | Status: DC | PRN
  Administered 2015-08-15: 20 mg via INTRAVENOUS

## 2015-08-15 MED ORDER — BUPIVACAINE-EPINEPHRINE (PF) 0.25% -1:200000 IJ SOLN
INTRAMUSCULAR | Status: AC
  Filled 2015-08-15: qty 30

## 2015-08-15 MED ORDER — TETRACAINE HCL 1 % IJ SOLN
INTRAMUSCULAR | Status: AC
  Filled 2015-08-15: qty 2

## 2015-08-15 MED ORDER — NEOMYCIN-POLYMYXIN B GU 40-200000 IR SOLN
Status: AC
  Filled 2015-08-15: qty 4

## 2015-08-15 MED ORDER — KETAMINE HCL 50 MG/ML IJ SOLN
INTRAMUSCULAR | Status: DC | PRN
  Administered 2015-08-15: 25 mg via INTRAVENOUS

## 2015-08-15 MED ORDER — FENTANYL CITRATE (PF) 100 MCG/2ML IJ SOLN
25.0000 ug | INTRAMUSCULAR | Status: DC | PRN
  Administered 2015-08-16 (×2): 25 ug via INTRAVENOUS

## 2015-08-15 MED ORDER — SODIUM CHLORIDE 0.9 % IV SOLN
Freq: Once | INTRAVENOUS | Status: AC
  Administered 2015-08-16: 07:00:00 via INTRAVENOUS

## 2015-08-15 SURGICAL SUPPLY — 34 items
BAG COUNTER SPONGE EZ (MISCELLANEOUS) IMPLANT
CANISTER SUCT 1200ML W/VALVE (MISCELLANEOUS) IMPLANT
CHLORAPREP W/TINT 26ML (MISCELLANEOUS) IMPLANT
COUNTER SPONGE BAG EZ (MISCELLANEOUS)
DRAPE C-ARM XRAY 36X54 (DRAPES) IMPLANT
DRAPE C-ARMOR (DRAPES) IMPLANT
DRAPE TABLE BACK 80X90 (DRAPES) IMPLANT
DRSG AQUACEL AG ADV 3.5X10 (GAUZE/BANDAGES/DRESSINGS) IMPLANT
GAUZE PETRO XEROFOAM 1X8 (MISCELLANEOUS) IMPLANT
GAUZE SPONGE 4X4 12PLY STRL (GAUZE/BANDAGES/DRESSINGS) IMPLANT
GLOVE BIO SURGEON STRL SZ8 (GLOVE) IMPLANT
GLOVE SURG ORTHO 8.5 STRL (GLOVE) IMPLANT
GLOVE SURG XRAY 8.5 LX (GLOVE) IMPLANT
GOWN STRL REUS W/ TWL LRG LVL3 (GOWN DISPOSABLE) IMPLANT
GOWN STRL REUS W/TWL LRG LVL3 (GOWN DISPOSABLE)
HEMOVAC 400CC 10FR (MISCELLANEOUS) IMPLANT
KIT RM TURNOVER STRD PROC AR (KITS) IMPLANT
MAT BLUE FLOOR 46X72 FLO (MISCELLANEOUS) IMPLANT
NEEDLE FILTER BLUNT 18X 1/2SAF (NEEDLE)
NEEDLE FILTER BLUNT 18X1 1/2 (NEEDLE) IMPLANT
NEEDLE SPNL 18GX3.5 QUINCKE PK (NEEDLE) IMPLANT
NS IRRIG 1000ML POUR BTL (IV SOLUTION) IMPLANT
PACK HIP PROSTHESIS (MISCELLANEOUS) IMPLANT
PAD GROUND ADULT SPLIT (MISCELLANEOUS) IMPLANT
SOL PREP PVP 2OZ (MISCELLANEOUS)
SOLUTION PREP PVP 2OZ (MISCELLANEOUS) IMPLANT
SPONGE LAP 18X18 5 PK (GAUZE/BANDAGES/DRESSINGS) IMPLANT
STAPLER SKIN PROX 35W (STAPLE) IMPLANT
STOCKINETTE BIAS CUT 6 980064 (GAUZE/BANDAGES/DRESSINGS) IMPLANT
SUT QUILL 0 20X36 (SUTURE) IMPLANT
SUT VIC AB 2-0 CT1 27 (SUTURE)
SUT VIC AB 2-0 CT1 TAPERPNT 27 (SUTURE) IMPLANT
SYR 30ML LL (SYRINGE) IMPLANT
SYR 5ML LL (SYRINGE) IMPLANT

## 2015-08-15 NOTE — Care Management Note (Signed)
Case Management Note  Patient Details  Name: Jill House MRN: [id number] Date of Birth: 07/17/1924  Subjective/Objective:                  Met with patient who has mild dementia and her niece [name] [phone] or [phone] whom lives with patient to discuss discharge planning. Patient has no children. Her HCPOA is her nephew that lives in [place]- Bill Marley [phone]. Patient plans surgery today. She states she is not on O2 at home. She typically uses a rolling walker to ambulate. She would like to go to either of [place] SNF facilities at discharge.   Action/Plan: List of home health agencies left with patient and Sarah. RNCM will continue to follow along with CSW.   Expected Discharge Date:                  Expected Discharge Plan:     In-House Referral:  Clinical Social Work  Discharge planning Services  CM Consult  Post Acute Care Choice:    Choice offered to:  Patient, HC POA / Guardian  DME Arranged:    DME Agency:     HH Arranged:    HH Agency:     Status of Service:  In process, will continue to follow  Medicare Important Message Given:  Yes-second notification given Date Medicare IM Given:    Medicare IM give by:    Date Additional Medicare IM Given:    Additional Medicare Important Message give by:     If discussed at Long Length of Stay Meetings, dates discussed:    Additional Comments:  Angela Johnson, RN 08/15/2015, 10:39 AM  

## 2015-08-15 NOTE — H&P (Signed)
THE PATIENT WAS SEEN IN THE HOLDING AREA.  HISTORY, ALLERGIES, HOME MEDICATIONS AND OPERATIVE PROCEDURE WERE REVIEWED. RISKS AND BENEFITS OF SURGERY DISCUSSED WITH PATIENT AGAIN.  NO CHANGES FROM INITIAL HISTORY AND PHYSICAL NOTED.    

## 2015-08-15 NOTE — Anesthesia Preprocedure Evaluation (Addendum)
Anesthesia Evaluation  Patient identified by MRN, date of birth, ID band Patient awake    Reviewed: Allergy & Precautions, NPO status , Patient's Chart, lab work & pertinent test results, reviewed documented beta blocker date and time   History of Anesthesia Complications Negative for: history of anesthetic complications  Airway Mallampati: II  TM Distance: >3 FB Neck ROM: Full    Dental  (+) Chipped   Pulmonary asthma (as a child) ,           Cardiovascular hypertension, Pt. on medications and Pt. on home beta blockers      Neuro/Psych PSYCHIATRIC DISORDERS Depression    GI/Hepatic   Endo/Other  diabetes (hx now diet controlled)Hypothyroidism   Renal/GU      Musculoskeletal  (+) Arthritis ,   Abdominal   Peds  Hematology   Anesthesia Other Findings Off of eliquis > 48 hrs. Discussed care with niece. Sept 17 -  Platelets now 142000. Hb 7.6 getting 1 unit, blood bank to keep ahead by 2 units.  Reproductive/Obstetrics                        Anesthesia Physical Anesthesia Plan  ASA: III  Anesthesia Plan: General   Post-op Pain Management:    Induction:   Airway Management Planned: LMA  Additional Equipment:   Intra-op Plan:   Post-operative Plan:   Informed Consent: I have reviewed the patients History and Physical, chart, labs and discussed the procedure including the risks, benefits and alternatives for the proposed anesthesia with the patient or authorized representative who has indicated his/her understanding and acceptance.     Plan Discussed with: CRNA  Anesthesia Plan Comments:        Anesthesia Quick Evaluation

## 2015-08-15 NOTE — Progress Notes (Signed)
   08/15/15 0930  Clinical Encounter Type  Visited With Patient and family together  Visit Type Initial;Spiritual support  Referral From Nurse  Consult/Referral To Chaplain  Pt. Requested Catholic communion, made contact with Catholic priest. Mirna Mires. Karl Ito [phone]

## 2015-08-15 NOTE — Progress Notes (Addendum)
Patient ID: Jill House, female   DOB: 1924-07-10, 81 y.o.   MRN: [id number] Called that surgery cancelled with plt count 60 Hematology consult Transfuse platelets tomorrow morning prior to surgery.  Looking back at old lab values, her thrombocytopenia is not knew  Dr Renae Gloss

## 2015-08-15 NOTE — Care Management Important Message (Signed)
Important Message  Patient Details  Name: Jill House MRN: [id number] Date of Birth: [birth year]   Medicare Important Message Given:  Yes-second notification given    [name] 08/15/2015, 10:06 AM

## 2015-08-15 NOTE — Progress Notes (Signed)
Patient ID: Jill House, female   DOB: 17-Jan-1924, 81 y.o.   MRN: [id number] Patient ID: Jill House, female   DOB: [birth year], 81 y.o.   MRN: [id number] [facility] PROGRESS NOTE  PCP: Margaretann Loveless, MD  HPI/Subjective: Patient offers no complaints. Going to the operating room later today.  Objective: Filed Vitals:   08/15/15 0801  BP: 105/61  Pulse: 73  Temp: 98.1 F (36.7 C)  Resp: 18    Intake/Output Summary (Last 24 hours) at 08/15/15 0934 Last data filed at 08/15/15 7829  Gross per 24 hour  Intake 1782.5 ml  Output    625 ml  Net 1157.5 ml   Filed Weights   08/13/15 1341 08/13/15 1710  Weight: 65.772 kg (145 lb) 71.169 kg (156 lb 14.4 oz)    ROS: Review of Systems  Constitutional: Negative for fever and chills.  Eyes: Negative for blurred vision.  Respiratory: Negative for cough and shortness of breath.   Cardiovascular: Negative for chest pain.  Gastrointestinal: Negative for nausea, vomiting, abdominal pain, diarrhea and constipation.  Musculoskeletal: Positive for joint pain and falls.  Neurological: Negative for dizziness and headaches.   Exam: Physical Exam  HENT:  Nose: No mucosal edema.  Mouth/Throat: No oropharyngeal exudate or posterior oropharyngeal edema.  Eyes: EOM and lids are normal. Pupils are equal, round, and reactive to light.  Conjunctiva pale  Neck: No JVD present. Carotid bruit is not present. No edema present. No thyroid mass and no thyromegaly present.  Cardiovascular: S1 normal and S2 normal.  An irregularly irregular rhythm present. Exam reveals no gallop.   Murmur heard.  Systolic murmur is present with a grade of 2/6  Pulses:      Dorsalis pedis pulses are 2+ on the right side, and 2+ on the left side.  Respiratory: No respiratory distress. She has no wheezes. She has no rhonchi. She has no rales.  GI: Soft. Bowel sounds are normal. There is no tenderness.  Genitourinary:  Foley  Musculoskeletal:        Right shoulder: She exhibits no swelling.  Lymphadenopathy:    She has no cervical adenopathy.  Neurological: She is alert. No cranial nerve deficit.  Skin: Skin is warm. No rash noted. Nails show no clubbing.  The patient's left leg is wrapped with Ace bandage, ice and large immobilizer. I took off the immobilizer and tried to look under the Ace bandage. I did not see any bruising or hematoma there.  Psychiatric: She has a normal mood and affect.    Data Reviewed: Basic Metabolic Panel:  Recent Labs Lab 08/13/15 1335 08/14/15 0559 08/15/15 0550  NA 143 142 139  K 4.3 4.8 4.7  CL 103 108 108  CO2 30 27 27   GLUCOSE 159* 142* 133*  BUN 26* 33* 34*  CREATININE 1.55* 2.44* 2.02*  CALCIUM 9.0 7.8* 8.1*   Liver Function Tests:  Recent Labs Lab 08/13/15 1335  AST 32  ALT 14  ALKPHOS 64  BILITOT 0.8  PROT 7.1  ALBUMIN 3.9   CBC:  Recent Labs Lab 08/13/15 1335 08/14/15 0559 08/14/15 1146 08/14/15 2355 08/15/15 0550  WBC 7.7 11.3*  --   --  11.1*  HGB 12.1 8.0* 7.9* 8.4* 8.5*  HCT 36.1 24.3*  --   --  25.5*  MCV 92.3 93.0  --   --  91.2  PLT 109* 83*  --   --  60*     Recent Results (from the past 240 hour(s))  MRSA  PCR Screening     Status: None   Collection Time: Aug 15, 2015  3:45 PM  Result Value Ref Range Status   MRSA by PCR NEGATIVE NEGATIVE Final    Comment:        The GeneXpert MRSA Assay (FDA approved for NASAL specimens only), is one component of a comprehensive MRSA colonization surveillance program. It is not intended to diagnose MRSA infection nor to guide or monitor treatment for MRSA infections.      Studies: Dg Knee 1-2 Views Left  08-15-2015   CLINICAL DATA:  Fall, left knee pain  EXAM: LEFT KNEE - 1-2 VIEW  COMPARISON:  None.  FINDINGS: Two views of the left knee submitted. There is displaced mild comminuted fracture distal shaft of left femur. Left knee prosthesis with anatomic alignment. No evidence of loosening of prosthesis.   IMPRESSION: Displaced mild comminuted fracture distal shaft of left femur. Left knee prosthesis with anatomic alignment.   Electronically Signed   By: Natasha Mead M.D.   On: Aug 15, 2015 14:41   Dg Chest Portable 1 View  August 15, 2015   CLINICAL DATA:  Left knee pain, status post fall. Atrial fibrillation.  EXAM: PORTABLE CHEST - 1 VIEW  COMPARISON:  11/30/2014  FINDINGS: Mild cardiomegaly. Lungs are clear. No effusions or edema. No acute bony abnormality or pneumothorax.  IMPRESSION: Cardiomegaly.  No active disease.   Electronically Signed   By: Charlett Nose M.D.   On: 08-15-15 14:35    Scheduled Meds: . cholecalciferol  1,000 Units Oral Daily  . cyanocobalamin  1,000 mcg Intramuscular Q30 days  . docusate sodium  100 mg Oral BID  . donepezil  10 mg Oral Daily  . ferrous fumarate  1 tablet Oral Daily  . gabapentin  1,800 mg Oral BID  . memantine  5 mg Oral Daily  . metoprolol succinate  150 mg Oral QPM  . oxybutynin  5 mg Oral Q1400  . pantoprazole  40 mg Oral QPM  . potassium chloride  10 mEq Oral Daily  . pravastatin  40 mg Oral QPM  . raloxifene  60 mg Oral Daily  . vancomycin  1,000 mg Intravenous Once   Continuous Infusions: . sodium chloride 75 mL/hr at 08/15/15 0313    Assessment/Plan:  1. Anemia unspecified. Likely a combination of history of anemia ( pernicious anemia), dehydration, dilutional, and being on Eliquis with a fracture. Patient was given a transfusion yesterday and hemoglobin up to 8.4. Likely will end up needing another transfusion during the hospital course. 2. Acute on chronic renal failure- continue IV fluids and hold Lasix at this point. Creatinine better this morning at 2.02.  3. Atrial fibrillation- off Eliquis at this time with surgery planned. Rate control with metoprolol. Higher risk of stroke being off Eliquis. 4. Essential hypertension- blood pressure up and down likely with pain. 5. Gastroesophageal reflux disease without esophagitis continue  Protonix 6. Hyperlipidemia unspecified continue pravastatin 7. History of dementia on Namenda 8. Distal left femur fracture- surgery to be done once Eliquis out of system.  Code Status:     Code Status Orders        Start     Ordered   2015-08-15 1532  Do not attempt resuscitation (DNR)   Continuous    Question Answer Comment  In the event of cardiac or respiratory ARREST Do not call a "code blue"   In the event of cardiac or respiratory ARREST Do not perform Intubation, CPR, defibrillation or ACLS   In the  event of cardiac or respiratory ARREST Use medication by any route, position, wound care, and other measures to relive pain and suffering. May use oxygen, suction and manual treatment of airway obstruction as needed for comfort.      08/13/15 1531    Advance Directive Documentation        Most Recent Value   Type of Advance Directive  Healthcare Power of Attorney   Pre-existing out of facility DNR order (yellow form or pink MOST form)     "MOST" Form in Place?       Family Communication: niece at bedside  Disposition Plan: rehab 3 days post op  Consultants:  othopedic surgery  Time spent:  Alford Highland  [facility]

## 2015-08-15 NOTE — Progress Notes (Signed)
Subjective: Day of Surgery Procedure(s) (LRB): OPEN REDUCTION INTERNAL FIXATION (ORIF) DISTAL FEMUR FRACTURE (Left)    Patient reports pain as mild. Platelet count has dropped to 60,000 so anesthesia recommends delaying surgery to tomorrow.  Spoke with Dr Hilton Sinclair who will order a hematology consult and order platelets from [facility] for tomorrow.  I spoke with the patient's niece and explained situation to her as well.  Have posted surgery to tomorrow as well.   Objective:   VITALS:   Filed Vitals:   08/15/15 1319  BP: 109/66  Pulse:   Temp: 100.8 F (38.2 C)  Resp: 17    Neurovascular intact Sensation intact distally  LABS  Recent Labs  08/13/15 1335 08/14/15 0559 08/14/15 1146 08/14/15 2355 08/15/15 0550  HGB 12.1 8.0* 7.9* 8.4* 8.5*  HCT 36.1 24.3*  --   --  25.5*  WBC 7.7 11.3*  --   --  11.1*  PLT 109* 83*  --   --  60*     Recent Labs  08/13/15 1335 08/14/15 0559 08/15/15 0550  NA 143 142 139  K 4.3 4.8 4.7  BUN 26* 33* 34*  CREATININE 1.55* 2.44* 2.02*  GLUCOSE 159* 142* 133*     Recent Labs  08/13/15 1335  INR 1.30     Assessment/Plan: Day of Surgery Procedure(s) (LRB): OPEN REDUCTION INTERNAL FIXATION (ORIF) DISTAL FEMUR FRACTURE (Left)    Resume diet and prepare for surgery tomorrow or Sunday

## 2015-08-15 NOTE — Transfer of Care (Signed)
Immediate Anesthesia Transfer of Care Note  Patient: Jill House  Procedure(s) Performed: Procedure(s): OPEN REDUCTION INTERNAL FIXATION (ORIF) DISTAL FEMUR FRACTURE (Left)  Patient Location: PACU  Anesthesia Type:MAC  Level of Consciousness: awake  Airway & Oxygen Therapy: Patient Spontanous Breathing and Patient connected to nasal cannula oxygen  Post-op Assessment: Report given to RN  Post vital signs: Reviewed  Last Vitals:  Filed Vitals:   08/15/15 1319  BP: 109/66  Pulse: 95  Temp: 38.2 C  Resp: 17    Complications: No apparent anesthesia complications

## 2015-08-16 ENCOUNTER — Encounter
Admission: EM | Disposition: A | Discharge status: Skilled Nursing Facility | Payer: Self-pay | Source: Home / Self Care | Attending: Internal Medicine

## 2015-08-16 ENCOUNTER — Inpatient Hospital Stay: Payer: Medicare Other

## 2015-08-16 ENCOUNTER — Encounter: Payer: Self-pay | Admitting: Anesthesiology

## 2015-08-16 DIAGNOSIS — I4891 Unspecified atrial fibrillation: Secondary | ICD-10-CM

## 2015-08-16 DIAGNOSIS — I10 Essential (primary) hypertension: Secondary | ICD-10-CM

## 2015-08-16 DIAGNOSIS — D696 Thrombocytopenia, unspecified: Secondary | ICD-10-CM

## 2015-08-16 DIAGNOSIS — F0390 Unspecified dementia without behavioral disturbance: Secondary | ICD-10-CM

## 2015-08-16 DIAGNOSIS — D649 Anemia, unspecified: Secondary | ICD-10-CM

## 2015-08-16 DIAGNOSIS — R32 Unspecified urinary incontinence: Secondary | ICD-10-CM

## 2015-08-16 DIAGNOSIS — S72402A Unspecified fracture of lower end of left femur, initial encounter for closed fracture: Secondary | ICD-10-CM

## 2015-08-16 DIAGNOSIS — E538 Deficiency of other specified B group vitamins: Secondary | ICD-10-CM

## 2015-08-16 HISTORY — PX: ORIF FEMUR FRACTURE: SHX2119

## 2015-08-16 LAB — BASIC METABOLIC PANEL
Anion gap: 3 — ABNORMAL LOW (ref 5–15)
BUN: 28 mg/dL — AB (ref 6–20)
CO2: 27 mmol/L (ref 22–32)
CREATININE: 1.44 mg/dL — AB (ref 0.44–1.00)
Calcium: 7.9 mg/dL — ABNORMAL LOW (ref 8.9–10.3)
Chloride: 110 mmol/L (ref 101–111)
GFR, EST AFRICAN AMERICAN: 36 mL/min — AB (ref >60)
GFR, EST NON AFRICAN AMERICAN: 31 mL/min — AB (ref >60)
Glucose, Bld: 125 mg/dL — ABNORMAL HIGH (ref 65–99)
POTASSIUM: 4.7 mmol/L (ref 3.5–5.1)
SODIUM: 140 mmol/L (ref 135–145)

## 2015-08-16 LAB — CBC WITH DIFFERENTIAL/PLATELET
Basophils Absolute: 0.1 10*3/uL (ref 0–0.1)
Basophils Relative: 1 %
Eosinophils Absolute: 0.3 10*3/uL (ref 0–0.7)
Eosinophils Relative: 2 %
HCT: 22.8 % — ABNORMAL LOW (ref 35.0–47.0)
Hemoglobin: 7.6 g/dL — ABNORMAL LOW (ref 12.0–16.0)
Lymphocytes Relative: 4 %
Lymphs Abs: 0.5 10*3/uL — ABNORMAL LOW (ref 1.0–3.6)
MCH: 30.5 pg (ref 26.0–34.0)
MCHC: 33.4 g/dL (ref 32.0–36.0)
MCV: 91.3 fL (ref 80.0–100.0)
Monocytes Absolute: 0.8 10*3/uL (ref 0.2–0.9)
Monocytes Relative: 7 %
Neutro Abs: 10 10*3/uL — ABNORMAL HIGH (ref 1.4–6.5)
Neutrophils Relative %: 86 %
Platelets: 142 10*3/uL — ABNORMAL LOW (ref 150–440)
RBC: 2.49 MIL/uL — ABNORMAL LOW (ref 3.80–5.20)
RDW: 14.7 % — ABNORMAL HIGH (ref 11.5–14.5)
WBC: 11.6 10*3/uL — ABNORMAL HIGH (ref 3.6–11.0)

## 2015-08-16 LAB — CREATININE, SERUM
CREATININE: 1.31 mg/dL — AB (ref 0.44–1.00)
GFR, EST AFRICAN AMERICAN: 41 mL/min — AB (ref >60)
GFR, EST NON AFRICAN AMERICAN: 35 mL/min — AB (ref >60)

## 2015-08-16 LAB — CBC
HCT: 31.2 % — ABNORMAL LOW (ref 35.0–47.0)
Hemoglobin: 10.5 g/dL — ABNORMAL LOW (ref 12.0–16.0)
MCH: 30.4 pg (ref 26.0–34.0)
MCHC: 33.5 g/dL (ref 32.0–36.0)
MCV: 90.6 fL (ref 80.0–100.0)
Platelets: 157 10*3/uL (ref 150–440)
RBC: 3.45 MIL/uL — ABNORMAL LOW (ref 3.80–5.20)
RDW: 14.7 % — ABNORMAL HIGH (ref 11.5–14.5)
WBC: 15.3 10*3/uL — ABNORMAL HIGH (ref 3.6–11.0)

## 2015-08-16 LAB — PLATELET COUNT: Platelets: 69 10*3/uL — ABNORMAL LOW (ref 150–440)

## 2015-08-16 LAB — PREPARE RBC (CROSSMATCH)

## 2015-08-16 LAB — HEMOGLOBIN: HEMOGLOBIN: 7.7 g/dL — AB (ref 12.0–16.0)

## 2015-08-16 SURGERY — OPEN REDUCTION INTERNAL FIXATION (ORIF) DISTAL FEMUR FRACTURE
Anesthesia: Choice | Laterality: Left

## 2015-08-16 MED ORDER — ENOXAPARIN SODIUM 30 MG/0.3ML ~~LOC~~ SOLN
30.0000 mg | SUBCUTANEOUS | Status: DC
  Administered 2015-08-17 – 2015-08-18 (×2): 30 mg via SUBCUTANEOUS
  Filled 2015-08-16 (×2): qty 0.3

## 2015-08-16 MED ORDER — PHENOL 1.4 % MT LIQD
1.0000 | OROMUCOSAL | Status: DC | PRN
  Filled 2015-08-16: qty 177

## 2015-08-16 MED ORDER — NEOMYCIN-POLYMYXIN B GU 40-200000 IR SOLN
Status: AC
  Filled 2015-08-16: qty 4

## 2015-08-16 MED ORDER — VANCOMYCIN HCL IN DEXTROSE 1-5 GM/200ML-% IV SOLN
1000.0000 mg | Freq: Once | INTRAVENOUS | Status: DC
  Filled 2015-08-16: qty 200

## 2015-08-16 MED ORDER — BUPIVACAINE-EPINEPHRINE 0.5% -1:200000 IJ SOLN
INTRAMUSCULAR | Status: DC | PRN
  Administered 2015-08-16: 30 mL

## 2015-08-16 MED ORDER — ACETAMINOPHEN 500 MG PO TABS
1000.0000 mg | ORAL_TABLET | Freq: Four times a day (QID) | ORAL | Status: AC
  Administered 2015-08-16 – 2015-08-17 (×3): 1000 mg via ORAL
  Filled 2015-08-16 (×4): qty 2

## 2015-08-16 MED ORDER — MENTHOL 3 MG MT LOZG: 1.0000 | LOZENGE | OROMUCOSAL | Status: DC | PRN

## 2015-08-16 MED ORDER — BUPIVACAINE-EPINEPHRINE (PF) 0.25% -1:200000 IJ SOLN
INTRAMUSCULAR | Status: AC
  Filled 2015-08-16: qty 30

## 2015-08-16 MED ORDER — ZOLPIDEM TARTRATE 5 MG PO TABS: 5.0000 mg | ORAL_TABLET | Freq: Every evening | ORAL | Status: DC | PRN

## 2015-08-16 MED ORDER — ALUM & MAG HYDROXIDE-SIMETH 200-200-20 MG/5ML PO SUSP: 30.0000 mL | ORAL | Status: DC | PRN

## 2015-08-16 MED ORDER — ONDANSETRON HCL 4 MG/2ML IJ SOLN: 4.0000 mg | Freq: Four times a day (QID) | INTRAMUSCULAR | Status: DC | PRN

## 2015-08-16 MED ORDER — SODIUM CHLORIDE 0.9 % IV SOLN
Freq: Once | INTRAVENOUS | Status: AC
  Administered 2015-08-16: 08:00:00 via INTRAVENOUS

## 2015-08-16 MED ORDER — ACETAMINOPHEN 650 MG RE SUPP: 650.0000 mg | Freq: Four times a day (QID) | RECTAL | Status: DC | PRN

## 2015-08-16 MED ORDER — ONDANSETRON HCL 4 MG PO TABS: 4.0000 mg | ORAL_TABLET | Freq: Four times a day (QID) | ORAL | Status: DC | PRN

## 2015-08-16 MED ORDER — HYDROCODONE-ACETAMINOPHEN 5-325 MG PO TABS
1.0000 | ORAL_TABLET | Freq: Four times a day (QID) | ORAL | Status: DC | PRN
  Administered 2015-08-16 – 2015-08-18 (×5): 1 via ORAL
  Filled 2015-08-16 (×6): qty 1

## 2015-08-16 MED ORDER — SODIUM CHLORIDE 0.45 % IV SOLN
INTRAVENOUS | Status: DC
  Administered 2015-08-16: 19:00:00 via INTRAVENOUS

## 2015-08-16 MED ORDER — FERROUS SULFATE 325 (65 FE) MG PO TABS
325.0000 mg | ORAL_TABLET | Freq: Every day | ORAL | Status: DC
Start: 2015-08-17 — End: 2015-08-19
  Administered 2015-08-17 – 2015-08-19 (×3): 325 mg via ORAL
  Filled 2015-08-16 (×3): qty 1

## 2015-08-16 MED ORDER — SENNA 8.6 MG PO TABS
1.0000 | ORAL_TABLET | Freq: Two times a day (BID) | ORAL | Status: DC
Start: 2015-08-16 — End: 2015-08-19
  Administered 2015-08-16 – 2015-08-19 (×6): 8.6 mg via ORAL
  Filled 2015-08-16 (×6): qty 1

## 2015-08-16 MED ORDER — VANCOMYCIN HCL IN DEXTROSE 1-5 GM/200ML-% IV SOLN
1000.0000 mg | INTRAVENOUS | Status: AC
  Administered 2015-08-17: 1000 mg via INTRAVENOUS
  Filled 2015-08-16: qty 200

## 2015-08-16 MED ORDER — FLEET ENEMA 7-19 GM/118ML RE ENEM: 1.0000 | ENEMA | Freq: Once | RECTAL | Status: DC | PRN

## 2015-08-16 MED ORDER — METOCLOPRAMIDE HCL 5 MG PO TABS: 5.0000 mg | ORAL_TABLET | Freq: Three times a day (TID) | ORAL | Status: DC | PRN

## 2015-08-16 MED ORDER — BISACODYL 10 MG RE SUPP: 10.0000 mg | Freq: Every day | RECTAL | Status: DC | PRN

## 2015-08-16 MED ORDER — MORPHINE SULFATE (PF) 2 MG/ML IV SOLN: 0.5000 mg | INTRAVENOUS | Status: DC | PRN

## 2015-08-16 MED ORDER — BUPIVACAINE-EPINEPHRINE (PF) 0.5% -1:200000 IJ SOLN
INTRAMUSCULAR | Status: AC
  Filled 2015-08-16: qty 30

## 2015-08-16 MED ORDER — ACETAMINOPHEN 325 MG PO TABS: 650.0000 mg | ORAL_TABLET | Freq: Four times a day (QID) | ORAL | Status: DC | PRN

## 2015-08-16 MED ORDER — VANCOMYCIN HCL IN DEXTROSE 1-5 GM/200ML-% IV SOLN
1000.0000 mg | Freq: Once | INTRAVENOUS | Status: AC
  Administered 2015-08-16: 1000 mg via INTRAVENOUS
  Filled 2015-08-16: qty 200

## 2015-08-16 MED ORDER — MAGNESIUM HYDROXIDE 400 MG/5ML PO SUSP: 30.0000 mL | Freq: Every day | ORAL | Status: DC | PRN

## 2015-08-16 MED ORDER — METOCLOPRAMIDE HCL 5 MG/ML IJ SOLN: 5.0000 mg | Freq: Three times a day (TID) | INTRAMUSCULAR | Status: DC | PRN

## 2015-08-16 MED ORDER — FENTANYL CITRATE (PF) 100 MCG/2ML IJ SOLN
INTRAMUSCULAR | Status: AC
  Filled 2015-08-16: qty 2

## 2015-08-16 SURGICAL SUPPLY — 63 items
BAG COUNTER SPONGE EZ (MISCELLANEOUS) ×2 IMPLANT
BIT DRILL 2.5 NCB (BIT) ×2 IMPLANT
BIT DRILL 2.5MM NCB (BIT) ×1
BIT DRILL 4.3 (BIT) ×3 IMPLANT
BIT DRILL 4.3MM (BIT) ×1
BIT DRILL 4.3X300MM (BIT) ×1 IMPLANT
CANISTER SUCT 1200ML W/VALVE (MISCELLANEOUS) ×6 IMPLANT
CAP LOCK NCB (Cap) ×21 IMPLANT
CAST PADDING 6X4YD ST 30248 (SOFTGOODS) ×2
CHLORAPREP W/TINT 26ML (MISCELLANEOUS) ×3 IMPLANT
COUNTER SPONGE BAG EZ (MISCELLANEOUS) ×1
DRAPE C-ARM XRAY 36X54 (DRAPES) ×3 IMPLANT
DRAPE C-ARMOR (DRAPES) ×3 IMPLANT
DRAPE INCISE IOBAN 66X45 STRL (DRAPES) ×6 IMPLANT
DRAPE SHEET LG 3/4 BI-LAMINATE (DRAPES) ×3 IMPLANT
DRILL BIT 4.3 (BIT) ×2
DRSG AQUACEL AG ADV 3.5X10 (GAUZE/BANDAGES/DRESSINGS) IMPLANT
DRSG AQUACEL AG ADV 3.5X14 (GAUZE/BANDAGES/DRESSINGS) ×3 IMPLANT
GAUZE PETRO XEROFOAM 1X8 (MISCELLANEOUS) ×3 IMPLANT
GAUZE SPONGE 4X4 12PLY STRL (GAUZE/BANDAGES/DRESSINGS) ×3 IMPLANT
GLOVE BIO SURGEON STRL SZ8 (GLOVE) ×3 IMPLANT
GLOVE SURG ORTHO 8.5 STRL (GLOVE) ×3 IMPLANT
GLOVE SURG XRAY 8.5 LX (GLOVE) ×3 IMPLANT
GOWN STRL REUS W/ TWL LRG LVL3 (GOWN DISPOSABLE) ×2 IMPLANT
GOWN STRL REUS W/TWL LRG LVL3 (GOWN DISPOSABLE) ×4
HEMOVAC 400CC 10FR (MISCELLANEOUS) ×3 IMPLANT
K-WIRE 2.0 (WIRE) ×4
K-WIRE FXSTD 280X2XNS SS (WIRE) ×2
KIT RM TURNOVER STRD PROC AR (KITS) ×3 IMPLANT
KWIRE FXSTD 280X2XNS SS (WIRE) ×2 IMPLANT
MAT BLUE FLOOR 46X72 FLO (MISCELLANEOUS) IMPLANT
NEEDLE FILTER BLUNT 18X 1/2SAF (NEEDLE) ×2
NEEDLE FILTER BLUNT 18X1 1/2 (NEEDLE) ×1 IMPLANT
NEEDLE SPNL 18GX3.5 QUINCKE PK (NEEDLE) ×3 IMPLANT
NS IRRIG 1000ML POUR BTL (IV SOLUTION) ×3 IMPLANT
PACK HIP PROSTHESIS (MISCELLANEOUS) ×3 IMPLANT
PAD GROUND ADULT SPLIT (MISCELLANEOUS) ×3 IMPLANT
PADDING CAST COTTON 6X4 ST (SOFTGOODS) ×1 IMPLANT
PLATE 9H LEFT NCB (Plate) ×3 IMPLANT
SCREW 5.0 32MM (Screw) ×3 IMPLANT
SCREW CANCELLOUS 5.0X80 (Screw) ×9 IMPLANT
SCREW CANCELLOUS 5.0X85 (Screw) ×3 IMPLANT
SCREW CANNCELLOUS LEG NCB 5X50 (Screw) ×3 IMPLANT
SCREW CORTICAL NCB 5.0X40 (Screw) ×3 IMPLANT
SCREW NCB 5.0X36MM (Screw) ×6 IMPLANT
SCREW NCB 5.0X60 (Screw) ×2 IMPLANT
SCREW NCB 5.0X60MM (Screw) ×6 IMPLANT
SCREW NCB 5.0X75MM (Screw) ×2 IMPLANT
SCREW NCB PT 75X32X5XHEX (Screw) ×1 IMPLANT
SOL PREP PVP 2OZ (MISCELLANEOUS) ×3
SOLUTION PREP PVP 2OZ (MISCELLANEOUS) ×1 IMPLANT
SPONGE LAP 18X18 5 PK (GAUZE/BANDAGES/DRESSINGS) ×6 IMPLANT
STAPLER SKIN PROX 35W (STAPLE) ×3 IMPLANT
STOCKINETTE BIAS CUT 6 980064 (GAUZE/BANDAGES/DRESSINGS) ×3 IMPLANT
SUT QUILL 0 20X36 (SUTURE) ×3 IMPLANT
SUT VIC AB 2-0 CT1 27 (SUTURE) ×4
SUT VIC AB 2-0 CT1 TAPERPNT 27 (SUTURE) ×2 IMPLANT
SYR 30ML LL (SYRINGE) ×3 IMPLANT
SYR 5ML LL (SYRINGE) ×3 IMPLANT
ZIMMER 2.0 MM GUIDE PIN IMPLANT
ZIMMER 2.5MM DRILL BIT IMPLANT
ZIMMER 4.3MM LONG DRILL BIT IMPLANT
ZIMMER 4.3MMSHORT DRILL BIT IMPLANT

## 2015-08-16 NOTE — OR Nursing (Signed)
Patient has polar care in place  And x-rays completed in PACU

## 2015-08-16 NOTE — Anesthesia Postprocedure Evaluation (Signed)
  Anesthesia Post-op Note  Patient: Jill House  Procedure(s) Performed: Procedure(s): OPEN REDUCTION INTERNAL FIXATION (ORIF) DISTAL FEMUR FRACTURE (Left)  Anesthesia type:General  Patient location: PACU  Post pain: Pain level controlled  Post assessment: Post-op Vital signs reviewed, Patient's Cardiovascular Status Stable, Respiratory Function Stable, Patent Airway and No signs of Nausea or vomiting  Post vital signs: Reviewed and stable  Last Vitals:  Filed Vitals:   08/16/15 1102  BP: 118/55  Pulse: 64  Temp: 36.9 C  Resp: 18    Level of consciousness: awake, alert  and patient cooperative  Complications: No apparent anesthesia complications

## 2015-08-16 NOTE — Progress Notes (Signed)
Used last unit of allocated blood available for patient.  Patient to go to OR.  Per Dr. [place] Bing, approved to prepare another unit for standby for OR.  Called and spoke with Vance Gather in [facility] and she will prepare 1 unit for patient if it should be needed.

## 2015-08-16 NOTE — Progress Notes (Signed)
[facility]  Date of admission:  08/13/2015  Inpatient day:  08/16/2015  Consulting physician:  Dr. Lennox Laity  Reason for Consultation:  Thrombocytopenia.  Chief Complaint: Jill House is a 81 y.o. female who was admitted with a left distal femur fracture.  HPI: The patient has been followed in the outpatient department by Dr. Berniece Andreas for about 2-1/2 years. She notes that over that time she has had no problems with her blood. Her niece notes that she was diagnosed with B12 deficiency years ago.  She receives B12 every month.  For the past 6 months, she has been on iron supplementation.  Her last colonoscopy was years ago. She's never had an EGD. She denies any epistaxis, blood gum bleeds, hematuria, melena, hematochezia, or vaginal bleeding.  I reviewed the patient's labs dating back to 11/30/2014 reveal a platelet count of 96,000 to 124,000.  Hematocrit at that time was 33.4 with a hemoglobin of 10.4, and white count 12,400. MCV was 83.  Differential on 12/01/2014 revealed a slight monocytosis.  The patient was admitted on 08/13/2015 following a fall.  She suffered a distal left femur fracture. Surgery was cancelled yesterday secondary to thrombocytopenia. Labs on admission included a hematocrit 36, hemoglobin 12.1, MCV 82.3, white count 7700, and platelet count of 109,000. No differential is available.  Labbs on 08/14/2015 included a hematocrit 24.3, hemoglobin 8.0, and platelets 83,000. CBC yesterday included a hematocrit 25.5, hemeoglobin 8.5, and platelets 60,000.  Patient denies any new medications except for Eliquis in the past year for her atrial fibrillation  (she has had trouble maintaining a therapeutic INR).  She denies any herbal products.   Past Medical History  Diagnosis Date  . Atrial fibrillation   . Dementia   . Hip joint replacement status   . Knee joint replacement status   . Osteoarthritis   . Hypertension   . Urinary incontinence      Past Surgical History  Procedure Laterality Date  . Replacement total knee Bilateral   . Knee surgery    . Hip replacment    . Total abdominal hysterectomy w/ bilateral salpingoophorectomy    . Orif femur fracture Left 08/15/2015    Procedure: OPEN REDUCTION INTERNAL FIXATION (ORIF) DISTAL FEMUR FRACTURE;  Surgeon: Earnestine Leys, MD;  Location: [facility] ORS;  Service: Orthopedics;  Laterality: Left;    Family History  Problem Relation Age of Onset  . CVA Sister   . Heart attack Brother     Social History:  reports that she has never smoked. She does not have any smokeless tobacco history on file. She reports that she does not drink alcohol. Her drug history is not on file. The patient  Lives an apartment attached to her niece's home.  The patient is accompanied by her niece, Ferrel Logan.  Allergies:  Allergies  Allergen Reactions  . Keflex [Cephalexin] Other (See Comments)    Reaction:  Tremors   . Ciprofloxacin Rash and Swelling    Medications Prior to Admission  Medication Sig Dispense Refill  . apixaban (ELIQUIS) 2.5 MG TABS tablet Take 2.5 mg by mouth 2 (two) times daily.    . cholecalciferol (VITAMIN D) 1000 UNITS tablet Take 1,000 Units by mouth daily.    . cyanocobalamin (,VITAMIN B-12,) 1000 MCG/ML injection Inject 1,000 mcg into the muscle every 30 (thirty) days.    Marland Kitchen donepezil (ARICEPT) 10 MG tablet Take 10 mg by mouth daily.    Marland Kitchen Fe Cbn-Fe Gluc-FA-B12-C-DSS (FERRALET 90) 90-1 MG TABS  Take 1 tablet by mouth daily.    . furosemide (LASIX) 20 MG tablet Take 20 mg by mouth daily.     Marland Kitchen gabapentin (NEURONTIN) 600 MG tablet Take 1,800 mg by mouth 2 (two) times daily.    . memantine (NAMENDA) 5 MG tablet Take 5 mg by mouth daily.    . metoprolol succinate (TOPROL-XL) 100 MG 24 hr tablet Take 150 mg by mouth every evening.    Marland Kitchen oxybutynin (DITROPAN) 5 MG tablet Take 5 mg by mouth daily at 2 PM daily at 2 PM.    . Oxycodone HCl 10 MG TABS Take 10 mg by mouth every 6 (six)  hours.    . pantoprazole (PROTONIX) 40 MG tablet Take 40 mg by mouth every evening.    . potassium chloride (K-DUR) 10 MEQ tablet Take 10 mEq by mouth daily.    . pravastatin (PRAVACHOL) 40 MG tablet Take 40 mg by mouth every evening.    . raloxifene (EVISTA) 60 MG tablet Take 60 mg by mouth daily.      Review of Systems: GENERAL:  Feels fine.  No fevers, sweats or weight loss.  Weight gain of 10 pounds in the past 2 1/2 years. PERFORMANCE STATUS (ECOG):  1 HEENT:  No visual changes, runny nose, sore throat, mouth sores or tenderness. Lungs: No shortness of breath or cough.  No hemoptysis. Cardiac:  No chest pain, palpitations, orthopnea, or PND. GI:  No nausea, vomiting, diarrhea, constipation, melena or hematochezia. GU:  No urgency, frequency, dysuria, or hematuria. Musculoskeletal:  Leg pain post fracture.  Arthritis.  No muscle tenderness. Extremities:  No pain or swelling. Skin:  Bruising upper arm.  No rashes or skin changes. Neuro:  No headache, numbness or weakness, balance or coordination issues. Endocrine:  No diabetes, thyroid issues, hot flashes or night sweats. Psych:  No mood changes, depression or anxiety. Pain:  No focal pain. Review of systems:  All other systems reviewed and found to be negative.  Physical Exam:  Blood pressure 115/57, pulse 39, temperature 98.3 F (36.8 C), temperature source Oral, resp. rate 20, height '5\' 4"'  (1.626 m), weight 156 lb 14.4 oz (71.169 kg), SpO2 81 %.  GENERAL:  Well developed, well nourished, sitting comfortably on the medical unit in no acute distress. MENTAL STATUS:  Alert and oriented to person, place and time. HEAD:  Short curly gray hair.  Normocephalic, atraumatic, face symmetric, no Cushingoid features. EYES:  Pupils equal round and reactive to light and accomodation.  No conjunctivitis or scleral icterus. ENT:  Oropharynx clear without lesion.  Tongue normal. Mucous membranes moist.  RESPIRATORY:  Clear to auscultation  without rales, wheezes or rhonchi. CARDIOVASCULAR: Irregular rhythm without murmur, rub or gallop. ABDOMEN:  Soft, non-tender, with active bowel sounds, and no hepatosplenomegaly.  No masses. SKIN:  No petechiae.  No rashes, ulcers or lesions. EXTREMITIES: Left leg propped up on pillow.  No edema, no skin discoloration or tenderness.  No palpable cords. LYMPH NODES: No palpable cervical, supraclavicular, axillary or inguinal adenopathy  NEUROLOGICAL: Unremarkable. PSYCH:  Appropriate.  Results for orders placed or performed during the hospital encounter of 08/13/15 (from the past 48 hour(s))  Hemoglobin     Status: Abnormal   Collection Time: 08/14/15 11:46 AM  Result Value Ref Range   Hemoglobin 7.9 (L) 12.0 - 16.0 g/dL  Type and screen for Red Blood Exchange     Status: None (Preliminary result)   Collection Time: 08/14/15  2:18 PM  Result Value  Ref Range   ABO/RH(D) A POS    Antibody Screen NEG    Sample Expiration 08/17/2015    Unit Number [id number]    Blood Component Type RED CELLS,LR    Unit division 00    Status of Unit ISSUED,FINAL    Transfusion Status OK TO TRANSFUSE    Crossmatch Result Compatible    Unit Number [id number]    Blood Component Type RBC, LR IRR    Unit division 00    Status of Unit REL FROM [facility]    Transfusion Status OK TO TRANSFUSE    Crossmatch Result Compatible    Unit Number [id number]    Blood Component Type RED CELLS,LR    Unit division 00    Status of Unit REL FROM [facility]    Transfusion Status OK TO TRANSFUSE    Crossmatch Result Compatible    Unit Number [id number]    Blood Component Type RED CELLS,LR    Unit division 00    Status of Unit ALLOCATED    Transfusion Status OK TO TRANSFUSE    Crossmatch Result Compatible   ABO/Rh     Status: None   Collection Time: 08/14/15  2:19 PM  Result Value Ref Range   ABO/RH(D) A POS   Prepare RBC     Status: None   Collection Time: 08/14/15  4:00 PM  Result Value Ref Range   Order  Confirmation ORDER PROCESSED BY BLOOD BANK   Hemoglobin     Status: Abnormal   Collection Time: 08/14/15 11:55 PM  Result Value Ref Range   Hemoglobin 8.4 (L) 12.0 - 16.0 g/dL  Basic metabolic panel     Status: Abnormal   Collection Time: 08/15/15  5:50 AM  Result Value Ref Range   Sodium 139 135 - 145 mmol/L   Potassium 4.7 3.5 - 5.1 mmol/L   Chloride 108 101 - 111 mmol/L   CO2 27 22 - 32 mmol/L   Glucose, Bld 133 (H) 65 - 99 mg/dL   BUN 34 (H) 6 - 20 mg/dL   Creatinine, Ser 2.02 (H) 0.44 - 1.00 mg/dL   Calcium 8.1 (L) 8.9 - 10.3 mg/dL   GFR calc non Af Amer 21 (L) >60 mL/min   GFR calc Af Amer 24 (L) >60 mL/min    Comment: (NOTE) The eGFR has been calculated using the CKD EPI equation. This calculation has not been validated in all clinical situations. eGFR's persistently <60 mL/min signify possible Chronic Kidney Disease.    Anion gap 4 (L) 5 - 15  CBC     Status: Abnormal   Collection Time: 08/15/15  5:50 AM  Result Value Ref Range   WBC 11.1 (H) 3.6 - 11.0 K/uL   RBC 2.79 (L) 3.80 - 5.20 MIL/uL   Hemoglobin 8.5 (L) 12.0 - 16.0 g/dL   HCT 25.5 (L) 35.0 - 47.0 %   MCV 91.2 80.0 - 100.0 fL   MCH 30.3 26.0 - 34.0 pg   MCHC 33.2 32.0 - 36.0 g/dL   RDW 15.5 (H) 11.5 - 14.5 %   Platelets 60 (L) 150 - 440 K/uL  Platelet count     Status: Abnormal   Collection Time: 08/15/15  3:31 PM  Result Value Ref Range   Platelets 66 (L) 150 - 440 K/uL    Comment: RESULT REPEATED AND VERIFIED  Basic metabolic panel     Status: Abnormal   Collection Time: 08/16/15  3:20 AM  Result Value Ref Range   Sodium  140 135 - 145 mmol/L   Potassium 4.7 3.5 - 5.1 mmol/L   Chloride 110 101 - 111 mmol/L   CO2 27 22 - 32 mmol/L   Glucose, Bld 125 (H) 65 - 99 mg/dL   BUN 28 (H) 6 - 20 mg/dL   Creatinine, Ser 1.44 (H) 0.44 - 1.00 mg/dL   Calcium 7.9 (L) 8.9 - 10.3 mg/dL   GFR calc non Af Amer 31 (L) >60 mL/min   GFR calc Af Amer 36 (L) >60 mL/min    Comment: (NOTE) The eGFR has been calculated  using the CKD EPI equation. This calculation has not been validated in all clinical situations. eGFR's persistently <60 mL/min signify possible Chronic Kidney Disease.    Anion gap 3 (L) 5 - 15  Hemoglobin     Status: Abnormal   Collection Time: 08/16/15  3:20 AM  Result Value Ref Range   Hemoglobin 7.7 (L) 12.0 - 16.0 g/dL  Platelet count     Status: Abnormal   Collection Time: 08/16/15  3:20 AM  Result Value Ref Range   Platelets 69 (L) 150 - 440 K/uL  Prepare Pheresed Platelets     Status: None (Preliminary result)   Collection Time: 08/16/15  7:37 AM  Result Value Ref Range   Unit Number [id number]    Blood Component Type PLTPHER LR1    Unit division 00    Status of Unit ISSUED    Transfusion Status OK TO TRANSFUSE   Prepare RBC     Status: None   Collection Time: 08/16/15  8:08 AM  Result Value Ref Range   Order Confirmation DUPLICATE ORDER. 2 RBCS CURRENTLY AVAILABLE.    No results found.  Assessment:  The patient is a 81 y.o. woman with a history of mild thrombocytopenia dating back to 11/2014. She has had an associated normocytic anemia.  She has been on B12 for years and oral iron for 6 months.  Diet is good.  She denies any new medications or herbal products.   She presented with an acute left femur fracture. Admission labs were consistent with her mild anemia and thrombocytopenia. Her hematocrit has subsequently dropped in conjunction with progressive thrombocytopenia. The etiology of her decline in counts is felt secondary to consumption due to bleeding associated with trauma and poor marrow reserve. She may have an underlying myelodysplastic syndrome.   WBC differential is unavailable. She had mild monocytosis in 11/2014 (unclear significance).  She is scheduled for surgery today.  She is currently receiving platelets.  Plan:   1.  Labs:  CBC with diff, ferritin, iron studies, retic, ANA, RF, folate, TSH. 2.  CBC with diff daily. 3.  Draw one hour post platelet  count to ensure adequate response pre op. 4.  Transfuse PRBCs and platelets as needed   Thank you for allowing me to participate in Jill House 's care.  I will follow her closely with you while hospitalized and after discharge in the outpatient department.  Lequita Asal, MD  08/16/2015, 10:26 AM

## 2015-08-16 NOTE — Progress Notes (Signed)
Patient ID: Jill House, female   DOB: 1924-05-31, 81 y.o.   MRN: [id number] [facility] PROGRESS NOTE  PCP: Margaretann Loveless, MD  HPI/Subjective: Patient states that she needs to go to the operating room today she cannot stand the pain in her leg. No complaints of shortness of breath or chest discomfort.  Objective: Filed Vitals:   08/16/15 0730  BP: 106/65  Pulse: 76  Temp: 98.6 F (37 C)  Resp: 18    Filed Weights   08/13/15 1341 08/13/15 1710  Weight: 65.772 kg (145 lb) 71.169 kg (156 lb 14.4 oz)    ROS: Review of Systems  Constitutional: Negative for fever and chills.  Eyes: Negative for blurred vision.  Respiratory: Negative for cough and shortness of breath.   Cardiovascular: Negative for chest pain.  Gastrointestinal: Negative for nausea, vomiting, abdominal pain, diarrhea and constipation.  Musculoskeletal: Positive for joint pain and falls.  Neurological: Negative for dizziness and headaches.   Exam: Physical Exam  Constitutional: She is oriented to person, place, and time.  HENT:  Nose: No mucosal edema.  Mouth/Throat: No oropharyngeal exudate or posterior oropharyngeal edema.  Eyes: EOM and lids are normal. Pupils are equal, round, and reactive to light.  Conjunctiva pale  Neck: No JVD present. Carotid bruit is not present. No edema present. No thyroid mass and no thyromegaly present.  Cardiovascular: S1 normal and S2 normal.  An irregularly irregular rhythm present. Exam reveals no gallop.   Murmur heard.  Systolic murmur is present with a grade of 2/6  Pulses:      Dorsalis pedis pulses are 2+ on the right side, and 2+ on the left side.  Respiratory: No respiratory distress. She has no wheezes. She has no rhonchi. She has no rales.  GI: Soft. Bowel sounds are normal. There is no tenderness.  Genitourinary:  Foley  Musculoskeletal:       Right shoulder: She exhibits no swelling.  Lymphadenopathy:    She has no cervical adenopathy.   Neurological: She is alert and oriented to person, place, and time. No cranial nerve deficit.  Patient having difficulty flexing and extending the left foot. Sensation to light touch intact left foot.  Skin: Skin is warm. No rash noted. Nails show no clubbing.  Psychiatric: She has a normal mood and affect.    Data Reviewed: Basic Metabolic Panel:  Recent Labs Lab 08/13/15 1335 08/14/15 0559 08/15/15 0550 08/16/15 0320  NA 143 142 139 140  K 4.3 4.8 4.7 4.7  CL 103 108 108 110  CO2 30 27 27 27   GLUCOSE 159* 142* 133* 125*  BUN 26* 33* 34* 28*  CREATININE 1.55* 2.44* 2.02* 1.44*  CALCIUM 9.0 7.8* 8.1* 7.9*   Liver Function Tests:  Recent Labs Lab 08/13/15 1335  AST 32  ALT 14  ALKPHOS 64  BILITOT 0.8  PROT 7.1  ALBUMIN 3.9   CBC:  Recent Labs Lab 08/13/15 1335 08/14/15 0559 08/14/15 1146 08/14/15 2355 08/15/15 0550 08/15/15 1531 08/16/15 0320  WBC 7.7 11.3*  --   --  11.1*  --   --   HGB 12.1 8.0* 7.9* 8.4* 8.5*  --  7.7*  HCT 36.1 24.3*  --   --  25.5*  --   --   MCV 92.3 93.0  --   --  91.2  --   --   PLT 109* 83*  --   --  60* 66*  --      Recent Results (from the  past 240 hour(s))  MRSA PCR Screening     Status: None   Collection Time: 08/13/15  3:45 PM  Result Value Ref Range Status   MRSA by PCR NEGATIVE NEGATIVE Final    Comment:        The GeneXpert MRSA Assay (FDA approved for NASAL specimens only), is one component of a comprehensive MRSA colonization surveillance program. It is not intended to diagnose MRSA infection nor to guide or monitor treatment for MRSA infections.       Scheduled Meds: . cholecalciferol  1,000 Units Oral Daily  . cyanocobalamin  1,000 mcg Intramuscular Q30 days  . docusate sodium  100 mg Oral BID  . donepezil  10 mg Oral Daily  . ferrous fumarate  1 tablet Oral Daily  . furosemide  40 mg Intravenous Once  . gabapentin  1,800 mg Oral BID  . memantine  5 mg Oral Daily  . metoprolol succinate  150 mg  Oral QPM  . oxybutynin  5 mg Oral Q1400  . pantoprazole  40 mg Oral QPM  . potassium chloride  10 mEq Oral Daily  . pravastatin  40 mg Oral QPM  . raloxifene  60 mg Oral Daily    Assessment/Plan:  1. Thrombocytopenia - looking back at labs at this is chronic. Surgery was canceled yesterday for platelet count of 60,000. From my standpoint can go to surgery with a platelet count greater than 50,000. I will transfuse platelets this morning prior to surgery so that she can go to surgery today. No need to recheck a platelet count after transfusion can proceed directly to surgery. No need to wait for oncology consultation for surgery. Likely the drop in platelets is secondary to the platelets accumulating at the fracture site. 2. Anemia unspecified. Likely a combination of history of anemia ( pernicious anemia), dehydration, dilutional, and being on Eliquis with a fracture. Hemoglobin dipped down to 7.7. Patient will likely need another transfusion of packed red blood cells.  3. Acute on chronic renal failure - continue IV fluids and hold Lasix at this point. Creatinine better this morning at  1.44.  4. Atrial fibrillation- off Eliquis at this time with surgery planned. Rate control with metoprolol. Higher risk of stroke being off Eliquis. 5. Essential hypertension- blood pressure up and down likely with pain. 6. Gastroesophageal reflux disease without esophagitis continue Protonix 7. Hyperlipidemia unspecified continue pravastatin 8. History of dementia on Namenda 9. Distal left femur fracture- surgery to be done today.   Code Status:     Code Status Orders        Start     Ordered   08/13/15 1532  Do not attempt resuscitation (DNR)   Continuous    Question Answer Comment  In the event of cardiac or respiratory ARREST Do not call a "code blue"   In the event of cardiac or respiratory ARREST Do not perform Intubation, CPR, defibrillation or ACLS   In the event of cardiac or respiratory  ARREST Use medication by any route, position, wound care, and other measures to relive pain and suffering. May use oxygen, suction and manual treatment of airway obstruction as needed for comfort.      08/13/15 1531    Advance Directive Documentation        Most Recent Value   Type of Advance Directive  Healthcare Power of Attorney   Pre-existing out of facility DNR order (yellow form or pink MOST form)     "MOST" Form in Place?  Disposition Plan: rehab 3 days post op  Consultants:  othopedic surgery  Time spent:  Alford Highland  [facility]

## 2015-08-16 NOTE — Addendum Note (Signed)
Addendum  created 08/16/15 1342 by Berdine Addison, MD   Modules edited: Clinical Notes   Clinical Notes:  File: [id number]

## 2015-08-16 NOTE — Progress Notes (Signed)
Went to lab to pick up platelets. Lab will call when unit is ready.

## 2015-08-16 NOTE — Progress Notes (Signed)
Called to Dr. Betti Cruz to report that pt is supposed to get 1 unit of platelets at 7 AM before surgery, but pt Hgb has also dropped to 7.7, and wonder if pt needs a blood transfusion as well. Dr. Betti Cruz said Dr. Irmo Bing should be in around 0700 and he will pass on that Hgb has dropped and pt may need a transfusion for that.

## 2015-08-16 NOTE — Op Note (Signed)
08/13/2015 - 08/16/2015  5:09 PM  PATIENT:  Jill House    PRE-OPERATIVE DIAGNOSIS: COMMINUTED, DISPLACED LEFT DISTAL FEMUR FRACTURE  POST-OPERATIVE DIAGNOSIS:  Same  PROCEDURE:  OPEN REDUCTION INTERNAL FIXATION (ORIF) LEFT DISTAL FEMUR FRACTURE  SURGEON:  Valinda Hoar, MD  ANESTHESIA:   General ET  PREOPERATIVE INDICATIONS:  Elis Rawlinson is a  81 y.o. female with a diagnosis of FRACTURE who elected for surgical management after extensive discussion of the risk and benefits with the patient and her niece.  The risks benefits and alternatives were discussed with the patient preoperatively including but not limited to the risks of infection, bleeding, nerve injury, cardiopulmonary complications, the need for revision surgery, among others, and the patient was willing to proceed.  EBL: 100 ml  OPERATIVE IMPLANTS: Zimmer nine-hole NCB locking plate for the distal femur  OPERATIVE FINDINGS: Comminuted displaced osteoporotic left distal femur fracture with total knee replacement  OPERATIVE PROCEDURE: Patient brought to the operating room and underwent satisfactory general endotracheal anesthesia in the supine position.  The left leg was prepped and draped in sterile fashion and the soft tissues infiltrated with half percent Sensorcaine with epinephrine.  A longitudinal lateral incision was made over the distal third of the femur with dissection carried out sharply through fascia down to bone.  The vastus lateralis was lifted anteriorly to expose the distal shaft.  The soft tissues were debrided off the lateral condyle, allowing elevation of the patella.  Irrigation was used.  There was minimal bleeding.  The fracture fragments were manipulated into the best possible position due to the comminution.  A Zimmer nine-hole NCB distal femur plate was aligned along the femur.  It was temporarily fixated with K wires distally and proximally.  The out rigger was used to find the proximal  holes.  Several 5.0 cancellus screws were then placed through the distal portion of the plate.  The recurvatum was corrected as best we could and deep proximal portion of the femur was filled with several cortical screws.  Fluoroscopy showed overall good alignment on AP and lateral views with minimal recurvatum.  Remaining distal screw holes were filled with cancellus screws.  Locking caps were applied.  Fluoroscopy showed good alignment on AP lateral views.  Wound was again irrigated and closed with #2 Quill on fascia, 0 Quill on subcutaneous H tissue and staples on all skin areas.  Aquacel was applied.  Sponge and needle count was correct.  Soft long-leg dressing and knee immobilizer were applied.  The patient was awakened and taken recovery in good condition.   Valinda Hoar, MD

## 2015-08-17 ENCOUNTER — Inpatient Hospital Stay: Payer: Medicare Other

## 2015-08-17 LAB — PREPARE PLATELET PHERESIS: UNIT DIVISION: 0

## 2015-08-17 LAB — IRON AND TIBC
IRON: 18 ug/dL — AB (ref 28–170)
Saturation Ratios: 10 % — ABNORMAL LOW (ref 10.4–31.8)
TIBC: 186 ug/dL — ABNORMAL LOW (ref 250–450)
UIBC: 168 ug/dL

## 2015-08-17 LAB — FOLATE: FOLATE: 23 ng/mL (ref >5.9)

## 2015-08-17 LAB — BASIC METABOLIC PANEL
Anion gap: 4 — ABNORMAL LOW (ref 5–15)
BUN: 29 mg/dL — AB (ref 6–20)
CHLORIDE: 106 mmol/L (ref 101–111)
CO2: 28 mmol/L (ref 22–32)
CREATININE: 1.4 mg/dL — AB (ref 0.44–1.00)
Calcium: 7.8 mg/dL — ABNORMAL LOW (ref 8.9–10.3)
GFR calc Af Amer: 37 mL/min — ABNORMAL LOW (ref >60)
GFR calc non Af Amer: 32 mL/min — ABNORMAL LOW (ref >60)
Glucose, Bld: 120 mg/dL — ABNORMAL HIGH (ref 65–99)
Potassium: 3.9 mmol/L (ref 3.5–5.1)
SODIUM: 138 mmol/L (ref 135–145)

## 2015-08-17 LAB — CBC
HCT: 26.1 % — ABNORMAL LOW (ref 35.0–47.0)
HEMOGLOBIN: 9.2 g/dL — AB (ref 12.0–16.0)
MCH: 31.9 pg (ref 26.0–34.0)
MCHC: 35.2 g/dL (ref 32.0–36.0)
MCV: 90.7 fL (ref 80.0–100.0)
Platelets: 119 10*3/uL — ABNORMAL LOW (ref 150–440)
RBC: 2.88 MIL/uL — ABNORMAL LOW (ref 3.80–5.20)
RDW: 15 % — ABNORMAL HIGH (ref 11.5–14.5)
WBC: 10.2 10*3/uL (ref 3.6–11.0)

## 2015-08-17 LAB — TSH: TSH: 2.029 u[IU]/mL (ref 0.350–4.500)

## 2015-08-17 LAB — FERRITIN: Ferritin: 152 ng/mL (ref 11–307)

## 2015-08-17 LAB — RETICULOCYTES
RBC.: 2.88 MIL/uL — ABNORMAL LOW (ref 3.80–5.20)
RETIC CT PCT: 2.4 % (ref 0.4–3.1)
Retic Count, Absolute: 69.1 10*3/uL (ref 19.0–183.0)

## 2015-08-17 MED ORDER — ALENDRONATE SODIUM 10 MG PO TABS
70.0000 mg | ORAL_TABLET | ORAL | Status: DC
Start: 2015-08-18 — End: 2015-08-17

## 2015-08-17 NOTE — Progress Notes (Signed)
[facility]  Date of admission:  08/13/2015  Inpatient day:  08/17/2015  Chief Complaint: Jill House is a 81 y.o. female who was admitted with a left distal femur fracture.  Subjective:  Patient has poor memory of events from yesterday.  She is unaware that she had surgery for a left femur fracture.  She complains of right knee pain.  Social history:  The patient is accompanied buy her neice and her neice's husband.  Past Medical History  Diagnosis Date  . Atrial fibrillation   . Dementia   . Hip joint replacement status   . Knee joint replacement status   . Osteoarthritis   . Hypertension   . Urinary incontinence     Past Surgical History  Procedure Laterality Date  . Replacement total knee Bilateral   . Knee surgery    . Hip replacment    . Total abdominal hysterectomy w/ bilateral salpingoophorectomy    . Orif femur fracture Left 08/15/2015    Procedure: OPEN REDUCTION INTERNAL FIXATION (ORIF) DISTAL FEMUR FRACTURE;  Surgeon: Earnestine Leys, MD;  Location: [facility] ORS;  Service: Orthopedics;  Laterality: Left;    Family History  Problem Relation Age of Onset  . CVA Sister   . Heart attack Brother     Social History:  reports that she has never smoked. She does not have any smokeless tobacco history on file. She reports that she does not drink alcohol. Her drug history is not on file. The patient  Lives an apartment attached to her niece's home.   Allergies:  Allergies  Allergen Reactions  . Keflex [Cephalexin] Other (See Comments)    Reaction:  Tremors   . Ciprofloxacin Rash and Swelling    Medications Prior to Admission  Medication Sig Dispense Refill  . apixaban (ELIQUIS) 2.5 MG TABS tablet Take 2.5 mg by mouth 2 (two) times daily.    . cholecalciferol (VITAMIN D) 1000 UNITS tablet Take 1,000 Units by mouth daily.    . cyanocobalamin (,VITAMIN B-12,) 1000 MCG/ML injection Inject 1,000 mcg into the muscle every 30 (thirty) days.    Marland Kitchen  donepezil (ARICEPT) 10 MG tablet Take 10 mg by mouth daily.    Marland Kitchen Fe Cbn-Fe Gluc-FA-B12-C-DSS (FERRALET 90) 90-1 MG TABS Take 1 tablet by mouth daily.    . furosemide (LASIX) 20 MG tablet Take 20 mg by mouth daily.     Marland Kitchen gabapentin (NEURONTIN) 600 MG tablet Take 1,800 mg by mouth 2 (two) times daily.    . memantine (NAMENDA) 5 MG tablet Take 5 mg by mouth daily.    . metoprolol succinate (TOPROL-XL) 100 MG 24 hr tablet Take 150 mg by mouth every evening.    Marland Kitchen oxybutynin (DITROPAN) 5 MG tablet Take 5 mg by mouth daily at 2 PM daily at 2 PM.    . Oxycodone HCl 10 MG TABS Take 10 mg by mouth every 6 (six) hours.    . pantoprazole (PROTONIX) 40 MG tablet Take 40 mg by mouth every evening.    . potassium chloride (K-DUR) 10 MEQ tablet Take 10 mEq by mouth daily.    . pravastatin (PRAVACHOL) 40 MG tablet Take 40 mg by mouth every evening.    . raloxifene (EVISTA) 60 MG tablet Take 60 mg by mouth daily.      Review of Systems: GENERAL:  Feels "ok".  No fevers, sweats or weight loss.  Weight gain of 10 pounds in the past 2 1/2 years. PERFORMANCE STATUS (ECOG):  1  HEENT:  No visual changes, runny nose, sore throat, mouth sores or tenderness. Lungs: No shortness of breath or cough.  No hemoptysis. Cardiac:  No chest pain, palpitations, orthopnea, or PND. GI:  No nausea, vomiting, diarrhea, constipation, melena or hematochezia. GU:  No urgency, frequency, dysuria, or hematuria. Musculoskeletal:  Left leg pain well managed.  Complains of right knee pain. Arthritis.  No muscle tenderness. Extremities:  No swelling. Skin:  Bruising upper arm.  No rashes or skin changes. Neuro:  No headache, numbness or weakness, balance or coordination issues. Endocrine:  No diabetes, thyroid issues, hot flashes or night sweats. Psych:  No mood changes, depression or anxiety. Pain:  Pain associated with right knee. Review of systems:  All other systems reviewed and found to be negative.  Physical Exam:  Blood  pressure 115/53, pulse 79, temperature 97.5 F (36.4 C), temperature source Oral, resp. rate 18, height '5\' 4"'  (1.626 m), weight 156 lb 14.4 oz (71.169 kg), SpO2 100 %.  GENERAL:  Well developed, well nourished, sitting comfortably on the medical unit in no acute distress. MENTAL STATUS:  Alert and oriented to person, place and time. HEAD:  Short curly gray hair.  Normocephalic, atraumatic, face symmetric, no Cushingoid features. EYES:  Pupils equal round and reactive to light and accomodation.  No conjunctivitis or scleral icterus. ENT:  Oropharynx clear without lesion.  Tongue normal. Mucous membranes moist.  RESPIRATORY:  Clear to auscultation without rales, wheezes or rhonchi. CARDIOVASCULAR: Irregular rhythm without murmur, rub or gallop. ABDOMEN:  Soft, non-tender, with active bowel sounds, and no hepatosplenomegaly.  No masses. SKIN:  No petechiae.  No rashes, ulcers or lesions. EXTREMITIES: Left leg bandaged.  Right knee propped up without erythema or edema.  Well healed vertical scar over right knee.  No edema, no skin discoloration or tenderness.  No palpable cords. LYMPH NODES: No palpable cervical, supraclavicular, axillary or inguinal adenopathy  NEUROLOGICAL: Unremarkable. PSYCH:  Appropriate.  Results for orders placed or performed during the hospital encounter of 08/13/15 (from the past 48 hour(s))  Platelet count     Status: Abnormal   Collection Time: 08/15/15  3:31 PM  Result Value Ref Range   Platelets 66 (L) 150 - 440 K/uL    Comment: RESULT REPEATED AND VERIFIED  Basic metabolic panel     Status: Abnormal   Collection Time: 08/16/15  3:20 AM  Result Value Ref Range   Sodium 140 135 - 145 mmol/L   Potassium 4.7 3.5 - 5.1 mmol/L   Chloride 110 101 - 111 mmol/L   CO2 27 22 - 32 mmol/L   Glucose, Bld 125 (H) 65 - 99 mg/dL   BUN 28 (H) 6 - 20 mg/dL   Creatinine, Ser 1.44 (H) 0.44 - 1.00 mg/dL   Calcium 7.9 (L) 8.9 - 10.3 mg/dL   GFR calc non Af Amer 31 (L) >60 mL/min    GFR calc Af Amer 36 (L) >60 mL/min    Comment: (NOTE) The eGFR has been calculated using the CKD EPI equation. This calculation has not been validated in all clinical situations. eGFR's persistently <60 mL/min signify possible Chronic Kidney Disease.    Anion gap 3 (L) 5 - 15  Hemoglobin     Status: Abnormal   Collection Time: 08/16/15  3:20 AM  Result Value Ref Range   Hemoglobin 7.7 (L) 12.0 - 16.0 g/dL  Platelet count     Status: Abnormal   Collection Time: 08/16/15  3:20 AM  Result Value Ref Range  Platelets 69 (L) 150 - 440 K/uL  Prepare Pheresed Platelets     Status: None (Preliminary result)   Collection Time: 08/16/15  7:37 AM  Result Value Ref Range   Unit Number [id number]    Blood Component Type PLTPHER LR1    Unit division 00    Status of Unit ISSUED    Transfusion Status OK TO TRANSFUSE   Prepare RBC     Status: None   Collection Time: 08/16/15  8:08 AM  Result Value Ref Range   Order Confirmation DUPLICATE ORDER. 2 RBCS CURRENTLY AVAILABLE.   CBC with Differential/Platelet     Status: Abnormal   Collection Time: 08/16/15 11:05 AM  Result Value Ref Range   WBC 11.6 (H) 3.6 - 11.0 K/uL   RBC 2.49 (L) 3.80 - 5.20 MIL/uL   Hemoglobin 7.6 (L) 12.0 - 16.0 g/dL   HCT 22.8 (L) 35.0 - 47.0 %   MCV 91.3 80.0 - 100.0 fL   MCH 30.5 26.0 - 34.0 pg   MCHC 33.4 32.0 - 36.0 g/dL   RDW 14.7 (H) 11.5 - 14.5 %   Platelets 142 (L) 150 - 440 K/uL    Comment: RESULT REPEATED AND VERIFIED   Neutrophils Relative % 86% %   Neutro Abs 10.0 (H) 1.4 - 6.5 K/uL   Lymphocytes Relative 4% %   Lymphs Abs 0.5 (L) 1.0 - 3.6 K/uL   Monocytes Relative 7% %   Monocytes Absolute 0.8 0.2 - 0.9 K/uL   Eosinophils Relative 2% %   Eosinophils Absolute 0.3 0 - 0.7 K/uL   Basophils Relative 1% %   Basophils Absolute 0.1 0 - 0.1 K/uL  Creatinine, serum     Status: Abnormal   Collection Time: 08/16/15  6:21 PM  Result Value Ref Range   Creatinine, Ser 1.31 (H) 0.44 - 1.00 mg/dL   GFR  calc non Af Amer 35 (L) >60 mL/min   GFR calc Af Amer 41 (L) >60 mL/min    Comment: (NOTE) The eGFR has been calculated using the CKD EPI equation. This calculation has not been validated in all clinical situations. eGFR's persistently <60 mL/min signify possible Chronic Kidney Disease.   CBC     Status: Abnormal   Collection Time: 08/16/15  6:21 PM  Result Value Ref Range   WBC 15.3 (H) 3.6 - 11.0 K/uL   RBC 3.45 (L) 3.80 - 5.20 MIL/uL   Hemoglobin 10.5 (L) 12.0 - 16.0 g/dL   HCT 31.2 (L) 35.0 - 47.0 %   MCV 90.6 80.0 - 100.0 fL   MCH 30.4 26.0 - 34.0 pg   MCHC 33.5 32.0 - 36.0 g/dL   RDW 14.7 (H) 11.5 - 14.5 %   Platelets 157 150 - 440 K/uL  Reticulocytes     Status: Abnormal   Collection Time: 08/17/15  3:46 AM  Result Value Ref Range   Retic Ct Pct 2.4 0.4 - 3.1 %   RBC. 2.88 (L) 3.80 - 5.20 MIL/uL   Retic Count, Manual 69.1 19.0 - 183.0 K/uL  Ferritin     Status: None   Collection Time: 08/17/15  3:46 AM  Result Value Ref Range   Ferritin 152 11 - 307 ng/mL  Iron and TIBC     Status: Abnormal   Collection Time: 08/17/15  3:46 AM  Result Value Ref Range   Iron 18 (L) 28 - 170 ug/dL   TIBC 186 (L) 250 - 450 ug/dL   Saturation Ratios 10 (L) 10.4 -  31.8 %   UIBC 168 ug/dL  Folate     Status: None   Collection Time: 08/17/15  3:46 AM  Result Value Ref Range   Folate 23.0 >5.9 ng/mL  TSH     Status: None   Collection Time: 08/17/15  3:46 AM  Result Value Ref Range   TSH 2.029 0.350 - 4.500 uIU/mL  CBC     Status: Abnormal   Collection Time: 08/17/15  3:46 AM  Result Value Ref Range   WBC 10.2 3.6 - 11.0 K/uL   RBC 2.88 (L) 3.80 - 5.20 MIL/uL   Hemoglobin 9.2 (L) 12.0 - 16.0 g/dL   HCT 26.1 (L) 35.0 - 47.0 %   MCV 90.7 80.0 - 100.0 fL   MCH 31.9 26.0 - 34.0 pg   MCHC 35.2 32.0 - 36.0 g/dL   RDW 15.0 (H) 11.5 - 14.5 %   Platelets 119 (L) 150 - 440 K/uL  Basic metabolic panel     Status: Abnormal   Collection Time: 08/17/15  3:46 AM  Result Value Ref Range    Sodium 138 135 - 145 mmol/L   Potassium 3.9 3.5 - 5.1 mmol/L   Chloride 106 101 - 111 mmol/L   CO2 28 22 - 32 mmol/L   Glucose, Bld 120 (H) 65 - 99 mg/dL   BUN 29 (H) 6 - 20 mg/dL   Creatinine, Ser 1.40 (H) 0.44 - 1.00 mg/dL   Calcium 7.8 (L) 8.9 - 10.3 mg/dL   GFR calc non Af Amer 32 (L) >60 mL/min   GFR calc Af Amer 37 (L) >60 mL/min    Comment: (NOTE) The eGFR has been calculated using the CKD EPI equation. This calculation has not been validated in all clinical situations. eGFR's persistently <60 mL/min signify possible Chronic Kidney Disease.    Anion gap 4 (L) 5 - 15   Dg C-arm 61-120 Min  08/16/2015   CLINICAL DATA:  Intraoperative fluoroscopic images from plate and screw fixation of distal femoral fracture.  EXAM: DG C-ARM 61-120 MIN; LEFT FEMUR 2 VIEWS  COMPARISON:  Knee radiograph dated 08/13/2015  FINDINGS: Two fluoroscopic images from distal left femoral fracture plate and screw fixation demonstrate sideplate and screw fixation through a comminuted oblique distal femoral fracture. The alignment is near anatomic. Prior knee arthroplasty hardware is intact.  IMPRESSION: Intraoperative images from distal left femoral fracture plate and screw fixation without evidence of immediate complications.   Electronically Signed   By: Fidela Salisbury M.D.   On: 08/16/2015 16:46   Dg Femur Min 2 Views Left  08/16/2015   CLINICAL DATA:  Intraoperative fluoroscopic images from plate and screw fixation of distal femoral fracture.  EXAM: DG C-ARM 61-120 MIN; LEFT FEMUR 2 VIEWS  COMPARISON:  Knee radiograph dated 08/13/2015  FINDINGS: Two fluoroscopic images from distal left femoral fracture plate and screw fixation demonstrate sideplate and screw fixation through a comminuted oblique distal femoral fracture. The alignment is near anatomic. Prior knee arthroplasty hardware is intact.  IMPRESSION: Intraoperative images from distal left femoral fracture plate and screw fixation without evidence of  immediate complications.   Electronically Signed   By: Fidela Salisbury M.D.   On: 08/16/2015 16:46   Dg Femur, Min 2 Views Right  08/17/2015   CLINICAL DATA:  [place] a few days ago.  Persistent leg pain.  EXAM: RIGHT FEMUR 2 VIEWS  COMPARISON:  None.  FINDINGS: The he hip is normally located. There is a fracture involving the distal femur surrounding  the femoral prosthesis. There is an associated knee joint effusion. No femoral shaft fracture.  IMPRESSION: Distal femur fracture involving both femoral condyles adjacent to the femoral prosthesis.  No hip or femoral shaft fracture.   Electronically Signed   By: Marijo Sanes M.D.   On: 08/17/2015 08:54   Dg Femur Port Min 2 Views Left  08/16/2015   CLINICAL DATA:  Fracture of distal left humerus, status post open reduction internal fixation.  EXAM: LEFT FEMUR PORTABLE 2 VIEWS  COMPARISON:  08/13/2015, knee radiograph  FINDINGS: There is a left total hip arthroplasty with long stem femoral component. Sclerotic appearance and remodeling of the proximal femur is noted. There is no evidence of proximal femoral fracture. The prosthetic femoral head is well situated within the acetabulum. There has been a lateral plate and screw fixation of comminuted distal femoral fracture, with near anatomic alignment. Three component knee arthroplasty is also seen. There are expected postsurgical changes within the soft tissues. Skin staples are seen.  IMPRESSION: Status post lateral plate and screw fixation of comminuted distal femoral fracture without evidence of immediate complications.  Prior left total hip arthroplasty, with a sclerotic appearance and remodeling of the proximal femur, without evidence of fracture.   Electronically Signed   By: Fidela Salisbury M.D.   On: 08/16/2015 18:06    Assessment:  The patient is a 81 y.o. woman with a history of mild thrombocytopenia dating back to 11/2014. She has had an associated normocytic anemia.  She has been on B12 for  years and oral iron for 6 months.  Diet is good.  She denies any new medications or herbal products.   She presented with an acute left femur fracture. Admission labs were consistent with her mild anemia and thrombocytopenia. Her hematocrit has subsequently dropped in conjunction with progressive thrombocytopenia. The etiology of her decline in counts is felt secondary to consumption due to bleeding associated with trauma and poor marrow reserve. She may have an underlying myelodysplastic syndrome.    Work-up on 08/16/2015 revealed a normal ferritin, iron studies suggesting chronic disease, low retic count for level of anemia (2.4%), normal folate and TSH.  Pending are ANA, RF, and B12.  She underwent open reduction and internal fixation of the left distal femur fracture on 08/16/2015.  Imaging today reveals distal femur fracture involving both condyles adjacent to the femoral prosthesis on the right.   She responded well to platelet transfusion.  She has no bleeding.  Plan:   1.  CBC with diff daily. 2.  Will follow-up pending labs. 3.  Transfuse PRBCs and platelets as needed   Thank you for allowing me to participate in Jill House 's care.  I will follow her closely with you while hospitalized and after discharge in the outpatient department.  Lequita Asal, MD  08/17/2015, 12:23 PM

## 2015-08-17 NOTE — Progress Notes (Signed)
Patient cooperative with care but complains of great pain during movement.  Xray confirmed hairline fracture on right distal femur near previous knee replacement.  Wrapped in ACE wrapping and immobilizer per Dr. Hyacinth Meeker instructions, non surgical.  Non weight bearing on both extremities.  PO pain medication is helping patient to rest comfortable inbetween care.

## 2015-08-17 NOTE — Progress Notes (Signed)

## 2015-08-17 NOTE — Progress Notes (Signed)
Paged Dr. Deeann Saint to advise right femur xray results resulted.  Distal Femur fracture near previous knee replacement.

## 2015-08-17 NOTE — Progress Notes (Signed)
Subjective: 1 Day Post-Op Procedure(s) (LRB): OPEN REDUCTION INTERNAL FIXATION (ORIF) DISTAL FEMUR FRACTURE (Left)    Patient reports pain as moderate. Has ahd right knee pain as well.  X-rays today show hairline fx of distal femoral condyles without displacement.  Not visible on lateral view.  Will apply ace and knee immobilizer.  This will set her rehab back significantly.  hgb and platelets acceptable today.  No significant bleeding noted.   Objective:   VITALS:   Filed Vitals:   08/17/15 0747  BP: 115/53  Pulse: 79  Temp: 97.5 F (36.4 C)  Resp: 18    Sensation intact distally Intact pulses distally Left drop foot unchanged.  ROM of ankle supple.  Right knee tender without deformity.  Mild swelling  LABS  Recent Labs  08/16/15 1105 08/16/15 1821 08/17/15 0346  HGB 7.6* 10.5* 9.2*  HCT 22.8* 31.2* 26.1*  WBC 11.6* 15.3* 10.2  PLT 142* 157 119*     Recent Labs  08/15/15 0550 08/16/15 0320 08/16/15 1821 08/17/15 0346  NA 139 140  --  138  K 4.7 4.7  --  3.9  BUN 34* 28*  --  29*  CREATININE 2.02* 1.44* 1.31* 1.40*  GLUCOSE 133* 125*  --  120*    No results for input(s): LABPT, INR in the last 72 hours.   Assessment/Plan: 1 Day Post-Op Procedure(s) (LRB): OPEN REDUCTION INTERNAL FIXATION (ORIF) DISTAL FEMUR FRACTURE (Left)   Advance diet Up with therapy to chair.   Apply ace and knee immobilizer right leg

## 2015-08-17 NOTE — Progress Notes (Signed)
PT Cancellation Note  Patient Details Name: Orpah Hausner MRN: [id number] DOB: Aug 18, 1924   Cancelled Treatment:    Reason Eval/Treat Not Completed:  (Hold PT, per RN, as patient has another hip fracture. )   Wilhemina Cash, PT, DPT, CWCE 08/17/2015, 10:13 AM

## 2015-08-17 NOTE — Progress Notes (Signed)
Called Blood bank to confirm actual amount of units of Packed Red blood cells patient has received and at this point 3 and 1 unit of platelets.

## 2015-08-17 NOTE — Progress Notes (Signed)
Patient ID: Jill House, female   DOB: 1924-04-02, 81 y.o.   MRN: [id number] [facility] PROGRESS NOTE  PCP: Margaretann Loveless, MD  HPI/Subjective: Patient also had some pain on the right thigh area and was found to have a fracture there also. Patient is breathing well.  Objective: Filed Vitals:   08/17/15 0747  BP: 115/53  Pulse: 79  Temp: 97.5 F (36.4 C)  Resp: 18    Filed Weights   08/13/15 1341 08/13/15 1710  Weight: 65.772 kg (145 lb) 71.169 kg (156 lb 14.4 oz)    ROS: Review of Systems  Constitutional: Negative for fever and chills.  Eyes: Negative for blurred vision.  Respiratory: Negative for cough and shortness of breath.   Cardiovascular: Negative for chest pain.  Gastrointestinal: Negative for nausea, vomiting, abdominal pain, diarrhea and constipation.  Musculoskeletal: Positive for joint pain and falls.  Neurological: Negative for dizziness and headaches.   Exam: Physical Exam  Constitutional: She is oriented to person, place, and time.  HENT:  Nose: No mucosal edema.  Mouth/Throat: No oropharyngeal exudate or posterior oropharyngeal edema.  Eyes: EOM and lids are normal. Pupils are equal, round, and reactive to light.  Conjunctiva pale  Neck: No JVD present. Carotid bruit is not present. No edema present. No thyroid mass and no thyromegaly present.  Cardiovascular: S1 normal and S2 normal.  An irregularly irregular rhythm present. Exam reveals no gallop.   Murmur heard.  Systolic murmur is present with a grade of 2/6  Pulses:      Dorsalis pedis pulses are 2+ on the right side, and 2+ on the left side.  Respiratory: No respiratory distress. She has no wheezes. She has no rhonchi. She has no rales.  GI: Soft. Bowel sounds are normal. There is no tenderness.  Genitourinary:  Foley  Musculoskeletal:       Right ankle: She exhibits swelling.       Left ankle: She exhibits swelling.  Lymphadenopathy:    She has no cervical adenopathy.   Neurological: She is alert and oriented to person, place, and time. No cranial nerve deficit.  Patient having difficulty flexing and extending the left foot. Sensation to light touch intact left foot.  Skin: Skin is warm. No rash noted. Nails show no clubbing.  Psychiatric: She has a normal mood and affect.    Data Reviewed: Basic Metabolic Panel:  Recent Labs Lab 08/13/15 1335 08/14/15 0559 08/15/15 0550 08/16/15 0320 08/16/15 1821 08/17/15 0346  NA 143 142 139 140  --  138  K 4.3 4.8 4.7 4.7  --  3.9  CL 103 108 108 110  --  106  CO2 --  28  GLUCOSE 159* 142* 133* 125*  --  120*  BUN 26* 33* 34* 28*  --  29*  CREATININE 1.55* 2.44* 2.02* 1.44* 1.31* 1.40*  CALCIUM 9.0 7.8* 8.1* 7.9*  --  7.8*   Liver Function Tests:  Recent Labs Lab 08/13/15 1335  AST 32  ALT 14  ALKPHOS 64  BILITOT 0.8  PROT 7.1  ALBUMIN 3.9   CBC:  Recent Labs Lab 08/14/15 0559  08/15/15 0550 08/15/15 1531 08/16/15 0320 08/16/15 1105 08/16/15 1821 08/17/15 0346  WBC 11.3*  --  11.1*  --   --  11.6* 15.3* 10.2  NEUTROABS  --   --   --   --   --  10.0*  --   --   HGB 8.0*  < >  8.5*  --  7.7* 7.6* 10.5* 9.2*  HCT 24.3*  --  25.5*  --   --  22.8* 31.2* 26.1*  MCV 93.0  --  91.2  --   --  91.3 90.6 90.7  PLT 83*  --  60* 66* 69* 142* 157 119*  < > = values in this interval not displayed.   Recent Results (from the past 240 hour(s))  MRSA PCR Screening     Status: None   Collection Time: 08/13/15  3:45 PM  Result Value Ref Range Status   MRSA by PCR NEGATIVE NEGATIVE Final    Comment:        The GeneXpert MRSA Assay (FDA approved for NASAL specimens only), is one component of a comprehensive MRSA colonization surveillance program. It is not intended to diagnose MRSA infection nor to guide or monitor treatment for MRSA infections.       Scheduled Meds: . acetaminophen  1,000 mg Oral 4 times per day  . cholecalciferol  1,000 Units Oral Daily  . cyanocobalamin   1,000 mcg Intramuscular Q30 days  . docusate sodium  100 mg Oral BID  . donepezil  10 mg Oral Daily  . enoxaparin (LOVENOX) injection  30 mg Subcutaneous Q24H  . ferrous fumarate  1 tablet Oral Daily  . ferrous sulfate  325 mg Oral Q breakfast  . gabapentin  1,800 mg Oral BID  . memantine  5 mg Oral Daily  . metoprolol succinate  150 mg Oral QPM  . oxybutynin  5 mg Oral Q1400  . pantoprazole  40 mg Oral QPM  . potassium chloride  10 mEq Oral Daily  . pravastatin  40 mg Oral QPM  . raloxifene  60 mg Oral Daily  . senna  1 tablet Oral BID    Assessment/Plan:  1.   Anemia unspecified. Patient transfused a total of 3 units of packed red blood cells. Hemoglobin today is stable at 9.2. 2.   Thrombocytopenia- patient's platelet count up after transfusion prior to surgery. Continue to monitor while here. 3.   Acute on chronic renal failure - creatinine improved to 1.4. 4.   Atrial fibrillation- Rate control with metoprolol. Higher risk of stroke being off Eliquis. Hopefully can restart Eliquis soon. 5.   Essential hypertension- last blood pressure controlled. 6.   Gastroesophageal reflux disease without esophagitis continue Protonix 7.   Hyperlipidemia unspecified continue pravastatin 8.   History of dementia on Namenda 9.   Distal left femur fracture- postoperative day one. 10. Distal right femur fracture- hairline as per orthopedics. Nonweightbearing for 2 weeks.  Code Status:     Code Status Orders        Start     Ordered   08/13/15 1532  Do not attempt resuscitation (DNR)   Continuous    Question Answer Comment  In the event of cardiac or respiratory ARREST Do not call a "code blue"   In the event of cardiac or respiratory ARREST Do not perform Intubation, CPR, defibrillation or ACLS   In the event of cardiac or respiratory ARREST Use medication by any route, position, wound care, and other measures to relive pain and suffering. May use oxygen, suction and manual treatment of  airway obstruction as needed for comfort.      08/13/15 1531    Advance Directive Documentation        Most Recent Value   Type of Advance Directive  Healthcare Power of Attorney   Pre-existing out of facility  DNR order (yellow form or pink MOST form)     "MOST" Form in Place?       Disposition Plan: rehab 3 days post op  Consultants:  othopedic surgery  Time spent:  Alford Highland  [facility]

## 2015-08-18 ENCOUNTER — Encounter: Payer: Self-pay | Admitting: Specialist

## 2015-08-18 LAB — BASIC METABOLIC PANEL
Anion gap: 5 (ref 5–15)
BUN: 28 mg/dL — AB (ref 6–20)
CALCIUM: 8 mg/dL — AB (ref 8.9–10.3)
CO2: 28 mmol/L (ref 22–32)
CREATININE: 1.3 mg/dL — AB (ref 0.44–1.00)
Chloride: 104 mmol/L (ref 101–111)
GFR calc non Af Amer: 35 mL/min — ABNORMAL LOW (ref >60)
GFR, EST AFRICAN AMERICAN: 41 mL/min — AB (ref >60)
Glucose, Bld: 120 mg/dL — ABNORMAL HIGH (ref 65–99)
Potassium: 4.2 mmol/L (ref 3.5–5.1)
SODIUM: 137 mmol/L (ref 135–145)

## 2015-08-18 LAB — TYPE AND SCREEN
ABO/RH(D): A POS
Antibody Screen: NEGATIVE
UNIT DIVISION: 0
UNIT DIVISION: 0
UNIT DIVISION: 0
UNIT DIVISION: 0
Unit division: 0
Unit division: 0

## 2015-08-18 LAB — CBC
HCT: 26.2 % — ABNORMAL LOW (ref 35.0–47.0)
Hemoglobin: 8.9 g/dL — ABNORMAL LOW (ref 12.0–16.0)
MCH: 30.9 pg (ref 26.0–34.0)
MCHC: 33.9 g/dL (ref 32.0–36.0)
MCV: 91.2 fL (ref 80.0–100.0)
PLATELETS: 136 10*3/uL — AB (ref 150–440)
RBC: 2.87 MIL/uL — ABNORMAL LOW (ref 3.80–5.20)
RDW: 14.7 % — AB (ref 11.5–14.5)
WBC: 12.5 10*3/uL — ABNORMAL HIGH (ref 3.6–11.0)

## 2015-08-18 MED ORDER — APIXABAN 2.5 MG PO TABS
2.5000 mg | ORAL_TABLET | Freq: Two times a day (BID) | ORAL | Status: DC
  Administered 2015-08-19: 2.5 mg via ORAL
  Filled 2015-08-18: qty 1

## 2015-08-18 MED ORDER — GABAPENTIN 300 MG PO CAPS
300.0000 mg | ORAL_CAPSULE | Freq: Three times a day (TID) | ORAL | Status: DC
  Administered 2015-08-18 – 2015-08-19 (×2): 300 mg via ORAL
  Filled 2015-08-18 (×2): qty 1

## 2015-08-18 NOTE — Progress Notes (Signed)
Pt was 3 man assist with slide board to chair

## 2015-08-18 NOTE — Progress Notes (Signed)
Plan of care discussed with patient and placed on board. Pain controlled with PRN medication. Patient resting in bed. Call bell within reach.

## 2015-08-18 NOTE — Evaluation (Signed)
Physical Therapy Evaluation Patient Details Name: Adalind Weitz MRN: [id number] DOB: [birth year] Today's Date: 08/18/2015   History of Present Illness  This patient is an 81 year old female who came to [facility] after 2 falls suffering a distal femur fracture on the left with ORIF repair. Also hairline fracture on R distal femur near her TKR.  Clinical Impression  Patient is a very pleasant and motivated 81 y/o with a hairline fx on RLE near old joint replacement and ORIF at left distal femur fx, leading her to be NWB bilaterally. She was ambulatory in the community with a RW for short distances prior to this admission, and participates fully in this session. She is in knee immobilizers bilaterally, which is limiting her ability to transfer and perform bed mobility. She is able to provide UE effort with slideboard transfer, however she requires total assist for her LEs and mod A x1 for her torso with significant cuing for head-hips relationship and use of UEs to perform transfer safely. Patient will require wheelchair training as well as slideboard transfer training until she is able to bear weight on her RLE/LLE again. Skilled acute PT services are indicated at this time to address the above deficits.     Follow Up Recommendations CIR    Equipment Recommendations       Recommendations for Other Services       Precautions / Restrictions Precautions Precautions: Knee;Fall Required Braces or Orthoses: Knee Immobilizer - Right;Knee Immobilizer - Left Knee Immobilizer - Right: On at all times Knee Immobilizer - Left: On at all times Restrictions Weight Bearing Restrictions: Yes RLE Weight Bearing: Non weight bearing LLE Weight Bearing: Non weight bearing      Mobility  Bed Mobility Overal bed mobility: +2 for physical assistance             General bed mobility comments: Patient is unable to actively bring LEs to dangle independently, she provides good effort, however she requires  min-mod A x1 for LEs and the same for her torso to transfer supine to sit.   Transfers                 General transfer comment: Sliding board used for bed to chair.  Ambulation/Gait                Stairs            Wheelchair Mobility    Modified Rankin (Stroke Patients Only)       Balance Overall balance assessment: Needs assistance   Sitting balance-Leahy Scale: Good Sitting balance - Comments: Patient is able to sit independently once positioned with feet in contact with floor. Patient able to perform slide board transfer with mod-max A x2 secondary to decreased UE strength/torso strength as she is NWB bilaterally.                                      Pertinent Vitals/Pain Pain Assessment: 0-10 Pain Score: 2  Pain Location: knees Pain Descriptors / Indicators: Aching Pain Intervention(s): Limited activity within patient's tolerance;Monitored during session;Utilized relaxation techniques    Home Living Family/patient expects to be discharged to:: Skilled nursing facility Living Arrangements:  (Alone with family near by in indepedent living appartment with dinning close by.) Available Help at Discharge: Family Type of Home: Apartment (at facility) Home Access: Ramped entrance     Home Layout: One level Home Equipment: Dan Humphreys -  2 wheels;Wheelchair - manual      Prior Function Level of Independence: Independent with assistive device(s) (Niece takes her for supplies)         Comments: Furiture walking inside, rolling walker outside     Hand Dominance        Extremity/Trunk Assessment   Upper Extremity Assessment:  (B UE 4-/5)           Lower Extremity Assessment: Defer to PT evaluation         Communication   Communication: No difficulties  Cognition Arousal/Alertness: Awake/alert Behavior During Therapy: WFL for tasks assessed/performed Overall Cognitive Status: Within Functional Limits for tasks assessed                       General Comments General comments (skin integrity, edema, etc.): RLE knee appears to be in mild flexion in brace.     Exercises General Exercises - Lower Extremity Ankle Circles/Pumps: AROM;AAROM;Both;10 reps Hip ABduction/ADduction: AAROM;Both;10 reps Straight Leg Raises: AAROM;Both;10 reps      Assessment/Plan    PT Assessment Patient needs continued PT services  PT Diagnosis Difficulty walking;Generalized weakness;Acute pain   PT Problem List Decreased strength;Decreased balance;Pain;Decreased knowledge of use of DME;Decreased activity tolerance;Decreased mobility  PT Treatment Interventions DME instruction;Gait training;Therapeutic exercise;Therapeutic activities   PT Goals (Current goals can be found in the Care Plan section) Acute Rehab PT Goals Patient Stated Goal: To get better. PT Goal Formulation: With patient/family Time For Goal Achievement: 09/01/15 Potential to Achieve Goals: Fair    Frequency BID   Barriers to discharge Inaccessible home environment      Co-evaluation               End of Session Equipment Utilized During Treatment: Gait belt;Oxygen (Slide board) Activity Tolerance: Patient tolerated treatment well Patient left: in chair;with call bell/phone within reach;with family/visitor present;with chair alarm set Nurse Communication: Mobility status;Need for lift equipment         Time: 1000-1030 PT Time Calculation (min) (ACUTE ONLY): 30 min   Charges:   PT Evaluation $Initial PT Evaluation Tier I: 1 Procedure     PT G Codes:       Kerin Ransom, PT, DPT    08/18/2015, 1:59 PM

## 2015-08-18 NOTE — Progress Notes (Signed)
Patient ID: Jill House, female   DOB: 02-15-1924, 81 y.o.   MRN: [id number] [facility] PROGRESS NOTE  PCP: Margaretann Loveless, MD  HPI/Subjective: Patient asking me why she can't bend her legs. She states that she has some pain but not a lot. She states she had a bowel movement. No complaints of shortness of breath or chest pain.   Objective: Filed Vitals:   08/18/15 1115  BP:   Pulse: 72  Temp:   Resp:     Filed Weights   08/13/15 1341 08/13/15 1710  Weight: 65.772 kg (145 lb) 71.169 kg (156 lb 14.4 oz)    ROS: Review of Systems  Constitutional: Negative for fever and chills.  Eyes: Negative for blurred vision.  Respiratory: Negative for cough and shortness of breath.   Cardiovascular: Negative for chest pain.  Gastrointestinal: Negative for nausea, vomiting, abdominal pain, diarrhea and constipation.  Musculoskeletal: Positive for joint pain and falls.  Neurological: Negative for dizziness and headaches.   Exam: Physical Exam  Constitutional: She is oriented to person, place, and time.  HENT:  Nose: No mucosal edema.  Mouth/Throat: No oropharyngeal exudate or posterior oropharyngeal edema.  Eyes: EOM and lids are normal. Pupils are equal, round, and reactive to light.  Neck: No JVD present. Carotid bruit is not present. No edema present. No thyroid mass and no thyromegaly present.  Cardiovascular: S1 normal and S2 normal.  An irregularly irregular rhythm present. Exam reveals no gallop.   Murmur heard.  Systolic murmur is present with a grade of 2/6  Pulses:      Dorsalis pedis pulses are 2+ on the right side, and 2+ on the left side.  Respiratory: No respiratory distress. She has no wheezes. She has no rhonchi. She has no rales.  GI: Soft. Bowel sounds are normal. There is no tenderness.  Genitourinary:  Foley  Musculoskeletal:       Right ankle: She exhibits swelling.       Left ankle: She exhibits swelling.  Lymphadenopathy:    She has no  cervical adenopathy.  Neurological: She is alert and oriented to person, place, and time. No cranial nerve deficit.  Patient having difficulty flexing and extending the left foot. Sensation to light touch intact left foot.  Skin: Skin is warm. No rash noted. Nails show no clubbing.  Psychiatric: She has a normal mood and affect.    Data Reviewed: Basic Metabolic Panel:  Recent Labs Lab 08/14/15 0559 08/15/15 0550 08/16/15 0320 08/16/15 1821 08/17/15 0346 08/18/15 0337  NA 142 139 140  --  138 137  K 4.8 4.7 4.7  --  3.9 4.2  CL 108 108 110  --  106 104  CO2 --  28 28  GLUCOSE 142* 133* 125*  --  120* 120*  BUN 33* 34* 28*  --  29* 28*  CREATININE 2.44* 2.02* 1.44* 1.31* 1.40* 1.30*  CALCIUM 7.8* 8.1* 7.9*  --  7.8* 8.0*   Liver Function Tests:  Recent Labs Lab 08/13/15 1335  AST 32  ALT 14  ALKPHOS 64  BILITOT 0.8  PROT 7.1  ALBUMIN 3.9   CBC:  Recent Labs Lab 08/15/15 0550  08/16/15 0320 08/16/15 1105 08/16/15 1821 08/17/15 0346 08/18/15 0337  WBC 11.1*  --   --  11.6* 15.3* 10.2 12.5*  NEUTROABS  --   --   --  10.0*  --   --   --   HGB 8.5*  --  7.7* 7.6* 10.5* 9.2* 8.9*  HCT 25.5*  --   --  22.8* 31.2* 26.1* 26.2*  MCV 91.2  --   --  91.3 90.6 90.7 91.2  PLT 60*  < > 69* 142* 157 119* 136*  < > = values in this interval not displayed.   Recent Results (from the past 240 hour(s))  MRSA PCR Screening     Status: None   Collection Time: 08/13/15  3:45 PM  Result Value Ref Range Status   MRSA by PCR NEGATIVE NEGATIVE Final    Comment:        The GeneXpert MRSA Assay (FDA approved for NASAL specimens only), is one component of a comprehensive MRSA colonization surveillance program. It is not intended to diagnose MRSA infection nor to guide or monitor treatment for MRSA infections.       Scheduled Meds: . cholecalciferol  1,000 Units Oral Daily  . cyanocobalamin  1,000 mcg Intramuscular Q30 days  . docusate sodium  100 mg Oral BID   . donepezil  10 mg Oral Daily  . enoxaparin (LOVENOX) injection  30 mg Subcutaneous Q24H  . ferrous fumarate  1 tablet Oral Daily  . ferrous sulfate  325 mg Oral Q breakfast  . gabapentin  1,800 mg Oral BID  . memantine  5 mg Oral Daily  . metoprolol succinate  150 mg Oral QPM  . oxybutynin  5 mg Oral Q1400  . pantoprazole  40 mg Oral QPM  . potassium chloride  10 mEq Oral Daily  . pravastatin  40 mg Oral QPM  . raloxifene  60 mg Oral Daily  . senna  1 tablet Oral BID    Assessment/Plan:  1.   Anemia unspecified. Patient transfused a total of 3 units of packed red blood cells. Hemoglobin today is 8.9. 2.   Thrombocytopenia- patient's platelet count up after transfusion prior to surgery. 3.   Acute on chronic renal failure stage3 - creatinine improved to 1.3. 4.   Atrial fibrillation- Rate control with metoprolol. Higher risk of stroke being off Eliquis. I'm paging orthopedic surgery to see whether or not I can restart the Eliquis or not. 5.   Essential hypertension- blood pressure on the lower side. 6.   Gastroesophageal reflux disease without esophagitis continue Protonix 7.   Hyperlipidemia unspecified continue pravastatin 8.   History of dementia on Namenda 9.   Distal left femur fracture- postoperative day 2. Hopefully to rehabilitation tomorrow. 10. Distal right femur fracture- hairline as per orthopedics. Nonweightbearing for 2 weeks.  Code Status:     Code Status Orders        Start     Ordered   08/13/15 1532  Do not attempt resuscitation (DNR)   Continuous    Question Answer Comment  In the event of cardiac or respiratory ARREST Do not call a "code blue"   In the event of cardiac or respiratory ARREST Do not perform Intubation, CPR, defibrillation or ACLS   In the event of cardiac or respiratory ARREST Use medication by any route, position, wound care, and other measures to relive pain and suffering. May use oxygen, suction and manual treatment of airway obstruction  as needed for comfort.      08/13/15 1531    Advance Directive Documentation        Most Recent Value   Type of Advance Directive  Healthcare Power of Attorney   Pre-existing out of facility DNR order (yellow form or pink MOST form)     "  MOST" Form in Place?       Disposition Plan: Hopefully to rehabilitation tomorrow.  Consultants:  othopedic surgery  Time spent:  Alford Highland  [facility]

## 2015-08-18 NOTE — Progress Notes (Signed)
Subjective: 2 Days Post-Op Procedure(s) (LRB): OPEN REDUCTION INTERNAL FIXATION (ORIF) DISTAL FEMUR FRACTURE (Left)    Patient reports pain as moderate. oob in chair.  Feels better than before surgery  Objective:   Alert, oob.  Dressings dry.  csm uncahnged.  Labs remain satisfactory  VITALS:   Filed Vitals:   08/18/15 1115  BP:   Pulse: 72  Temp:   Resp:       LABS  Recent Labs  08/16/15 1821 08/17/15 0346 08/18/15 0337  HGB 10.5* 9.2* 8.9*  HCT 31.2* 26.1* 26.2*  WBC 15.3* 10.2 12.5*  PLT 157 119* 136*     Recent Labs  08/16/15 0320 08/16/15 1821 08/17/15 0346 08/18/15 0337  NA 140  --  138 137  K 4.7  --  3.9 4.2  BUN 28*  --  29* 28*  CREATININE 1.44* 1.31* 1.40* 1.30*  GLUCOSE 125*  --  120* 120*    No results for input(s): LABPT, INR in the last 72 hours.   Assessment/Plan: 2 Days Post-Op Procedure(s) (LRB): OPEN REDUCTION INTERNAL FIXATION (ORIF) DISTAL FEMUR FRACTURE (Left)   Advance diet Discharge to SNF when available. Patient will remain Non weight bearing both legs for 6 weeks.   Restart eliquis tomorrow RTC 10 days

## 2015-08-18 NOTE — Care Management Important Message (Signed)
Important Message  Patient Details  Name: Jill House MRN: [id number] Date of Birth: 07-26-1924   Medicare Important Message Given:  Yes-third notification given    [name] 08/18/2015, 10:55 AM

## 2015-08-18 NOTE — Care Management (Signed)
Met again with patient and niece- both want SNF. PT pending.

## 2015-08-18 NOTE — Progress Notes (Signed)
Physical Therapy Treatment Patient Details Name: Jill House MRN: [id number] DOB: 01/27/1924 Today's Date: 08/18/2015    History of Present Illness This patient is an 81 year old female who came to [facility] after 2 falls suffering a distal femur fracture on the left with ORIF repair. Also hairline fracture on R distal femur near her TKR.    PT Comments    Patient is very enthusiastic about her rehab and continues to provide excellent participation with therapy. Patient demonstrates good UE strength to assist with bed mobility as patient was rotated on the bed to bring her her feet to foot of bed. Patient tolerated dependent +2 transfer from chair to bed. Skilled acute PT services continue to be indicated to address her mobility deficits.   Follow Up Recommendations  CIR     Equipment Recommendations       Recommendations for Other Services       Precautions / Restrictions Precautions Precautions: Knee;Fall Required Braces or Orthoses: Knee Immobilizer - Right;Knee Immobilizer - Left Knee Immobilizer - Right: On at all times Knee Immobilizer - Left: On at all times Restrictions Weight Bearing Restrictions: Yes RLE Weight Bearing: Non weight bearing LLE Weight Bearing: Non weight bearing    Mobility  Bed Mobility Overal bed mobility: +2 for physical assistance             General bed mobility comments: Patient rotated as she was transferred with her feet at head of bed due to configuration of room. Patient provided UE effort and required min-mod A x 1 for rotating her torso, while dependent assist for her LEs during rotation on bed.   Transfers                 General transfer comment: Patient lifted via +2 dependent with assistance underneath shoulders and for LEs to transfer from chair to bed to avoid anti-gravity attempt with slideboard.   Ambulation/Gait                 Stairs            Wheelchair Mobility    Modified Rankin (Stroke  Patients Only)       Balance Overall balance assessment: Needs assistance   Sitting balance-Leahy Scale: Good Sitting balance - Comments: Patient is able to self correct from initially leaning to her right and maintains appropriate balance during lift transfer.                             Cognition Arousal/Alertness: Awake/alert Behavior During Therapy: WFL for tasks assessed/performed Overall Cognitive Status: Within Functional Limits for tasks assessed                      Exercises General Exercises - Lower Extremity Ankle Circles/Pumps: AROM;AAROM;Both;10 reps Gluteal Sets: AROM;Both;10 reps Hip ABduction/ADduction: AAROM;Both;10 reps Straight Leg Raises: AAROM;Both;10 reps    General Comments General comments (skin integrity, edema, etc.): RLE knee appears in mild flexion RN staff notified earlier and attempted to contact MD if this is intentional or needs to be corrected.       Pertinent Vitals/Pain Pain Assessment: Faces Pain Score: 2  Faces Pain Scale: Hurts whole lot Pain Location: R knee more than left during transfer.  Pain Descriptors / Indicators: Aching Pain Intervention(s): Limited activity within patient's tolerance;Monitored during session;Relaxation;Repositioned    Home Living Family/patient expects to be discharged to:: Skilled nursing facility Living Arrangements:  (Alone with family  near by in indepedent living appartment with dinning close by.) Available Help at Discharge: Family Type of Home: Apartment (at facility) Home Access: Ramped entrance   Home Layout: One level Home Equipment: Walker - 2 wheels;Wheelchair - manual      Prior Function Level of Independence: Independent with assistive device(s) (Niece takes her for supplies)      Comments: Furiture walking inside, rolling walker outside   PT Goals (current goals can now be found in the care plan section) Acute Rehab PT Goals Patient Stated Goal: To get better. PT  Goal Formulation: With patient/family Time For Goal Achievement: 09/01/15 Potential to Achieve Goals: Fair Progress towards PT goals: Progressing toward goals    Frequency  BID    PT Plan Current plan remains appropriate    Co-evaluation             End of Session Equipment Utilized During Treatment: Oxygen Activity Tolerance: Patient tolerated treatment well Patient left: in bed;with call bell/phone within reach;with family/visitor present;with bed alarm set     Time: 1610-9604 PT Time Calculation (min) (ACUTE ONLY): 23 min  Charges:  $Therapeutic Exercise: 8-22 mins $Therapeutic Activity: 8-22 mins                    G Codes:      Kerin Ransom, PT, DPT    08/18/2015, 3:34 PM

## 2015-08-18 NOTE — Progress Notes (Signed)
Rehab Admissions Coordinator Note:  Patient was screened by Trish Mage for appropriateness for an Inpatient Acute Rehab Consult.  At this time, we are recommending Skilled Nursing Facility.  Patient has [employer] and it is unlikely that they would authorize an acute inpatient rehab admission.  In addition, OT recommending SNF placement.  Noted patient will be NWB for 6 weeks.  Given the above, SNF placement is likely best option. Call me for questions.  Trish Mage 08/18/2015, 1:32 PM  I can be reached at [phone].

## 2015-08-18 NOTE — Clinical Social Work Placement (Addendum)
   CLINICAL SOCIAL WORK PLACEMENT  NOTE  Date:  08/18/2015  Patient Details  Name: Jayden Rudge MRN: [id number] Date of Birth: 21-Apr-1924  Clinical Social Work is seeking post-discharge placement for this patient at the Skilled  Nursing Facility level of care (*CSW will initial, date and re-position this form in  chart as items are completed):  Yes   Patient/family provided with [facility] Clinical Social Work Department's list of facilities offering this level of care within the geographic area requested by the patient (or if unable, by the patient's family).  Yes   Patient/family informed of their freedom to choose among providers that offer the needed level of care, that participate in Medicare, Medicaid or managed care program needed by the patient, have an available bed and are willing to accept the patient.  Yes   Patient/family informed of [facility]'s ownership interest in [facility] and [facility], as well as of the fact that they are under no obligation to receive care at these facilities.  PASRR submitted to EDS on 08/14/15     PASRR number received on 08/14/15     Existing PASRR number confirmed on       FL2 transmitted to all facilities in geographic area requested by pt/family on 08/14/15     FL2 transmitted to all facilities within larger geographic area on       Patient informed that his/her managed care company has contracts with or will negotiate with certain facilities, including the following:        Yes   Patient/family informed of bed offers received.  Patient chooses bed at [facility]     Physician recommends and patient chooses bed at      Patient to be transferred to [facility] on  .08/19/2015   Patient to be transferred to facility by EMS     Patient family notified on   08/19/2015  of transfer.  Name of family member notified:   Niece- Sarah  PHYSICIAN    Additional Comment:     _______________________________________________

## 2015-08-18 NOTE — Clinical Social Work Note (Signed)
CSW spoke to pt's daughter.  She would like [facility]	[place] if possible for pt's SNF placement.  Per [facility] insurance will not authroize in pt rehab for pt.  CSW will send out bed search and f/u with offers once they have been received.

## 2015-08-18 NOTE — Evaluation (Signed)
Occupational Therapy Evaluation Patient Details Name: Makia Bossi MRN: [id number] DOB: 1924-03-26 Today's Date: 08/18/2015    History of Present Illness This patient is an 81 year old female who came to [facility] after 2 falls suffering a distal femur fracture on the left with ORIF repair. Also hairline fracture on R distal femur near her TKR.   Clinical Impression   This patient is an 81 year old female who came to [facility] with the above reasons. She lives in an independent apartment at a facility that has a dinning area. She had been independent with her basic activities of daily living and some higher level activities of daily living. Her niece helps her with shopping. She now needs quit a bit of assist and would benefit from Occupational Therapy for ADL/functioal mobility training while maintaining bilateral lower extremities non weight bearing.    Follow Up Recommendations  SNF    Equipment Recommendations       Recommendations for Other Services       Precautions / Restrictions Precautions Precautions: Knee;Fall Required Braces or Orthoses: Knee Immobilizer - Right;Knee Immobilizer - Left Knee Immobilizer - Right: On at all times Knee Immobilizer - Left: On at all times Restrictions Weight Bearing Restrictions: Yes RLE Weight Bearing: Weight bearing as tolerated LLE Weight Bearing: Weight bearing as tolerated      Mobility Bed Mobility Overal bed mobility: +2 for physical assistance              Transfers                 General transfer comment: Sliding board used for bed to chair with physical therapist and PT tech.    Balance Overall balance assessment: Needs assistance                                       ADL                                         General ADL Comments: Previously had been independent with basic and some higher level activities using walker at times. She now needs  much assist for her activities of daily living and functional mobility and would benefit from Occupational Therapy for ADL/functioal mobility training and strengthening of her upper extremities to aid in mobility.     Vision     Perception     Praxis      Pertinent Vitals/Pain Pain Assessment: 0-10 Pain Score: 2  Pain Location: knees Pain Descriptors / Indicators: Aching Pain Intervention(s): Limited activity within patient's tolerance;Monitored during session;Utilized relaxation techniques     Hand Dominance     Extremity/Trunk Assessment Upper Extremity Assessment Upper Extremity Assessment:  (B UE 4-/5)   Lower Extremity Assessment Lower Extremity Assessment: Defer to PT evaluation       Communication Communication Communication: No difficulties   Cognition Arousal/Alertness: Awake/alert Behavior During Therapy: WFL for tasks assessed/performed Overall Cognitive Status: Within Functional Limits for tasks assessed                     General Comments       Exercises  Bilateral manually resistant exercises for bilateral upper extremities include shoulder flexion, external and internal rotation elbow flexion and extension, forearm supination and pronation, wrist flexion  and extension, and hand flexion and extension 10 repetitions.      Shoulder Instructions      Home Living Family/patient expects to be discharged to:: Skilled nursing facility Living Arrangements:  (Alone with family near by in indepedent living appartment with dinning close by.) Available Help at Discharge: Family Type of Home: Apartment (at facility) Home Access: Ramped entrance     Home Layout: One level               Home Equipment: Walker - 2 wheels;Wheelchair - manual          Prior Functioning/Environment Level of Independence: Independent with assistive device(s) (Niece takes her for supplies)        Comments: Furiture walking inside, rolling walker outside     OT Diagnosis: Acute pain;Generalized weakness   OT Problem List: Decreased strength;Decreased range of motion;Decreased activity tolerance;Impaired balance (sitting and/or standing);Decreased knowledge of use of DME or AE;Pain   OT Treatment/Interventions:      OT Goals(Current goals can be found in the care plan section) Acute Rehab OT Goals Patient Stated Goal: To get better. OT Goal Formulation: With patient/family Time For Goal Achievement: 09/01/15  OT Frequency:     Barriers to D/C:            Co-evaluation              End of Session Equipment Utilized During Treatment: Gait belt (sliding board. Some overlap with Physical Therapy)  Activity Tolerance:   Patient left: in chair;with call bell/phone within reach;with chair alarm set;with family/visitor present   Time: 1010-1043 OT Time Calculation (min): 33 min Charges:  OT General Charges $OT Visit: 1 Procedure OT Evaluation $Initial OT Evaluation Tier I: 1 Procedure OT Treatments $Therapeutic Exercise: 8-22 mins G-Codes:    Gwyndolyn Kaufman, MS/OTR/L  08/18/2015, 11:56 AM

## 2015-08-19 ENCOUNTER — Encounter: Payer: Self-pay | Admitting: Specialist

## 2015-08-19 LAB — CBC
HEMATOCRIT: 25.3 % — AB (ref 35.0–47.0)
HEMOGLOBIN: 8.5 g/dL — AB (ref 12.0–16.0)
MCH: 30.7 pg (ref 26.0–34.0)
MCHC: 33.5 g/dL (ref 32.0–36.0)
MCV: 91.6 fL (ref 80.0–100.0)
Platelets: 137 10*3/uL — ABNORMAL LOW (ref 150–440)
RBC: 2.76 MIL/uL — ABNORMAL LOW (ref 3.80–5.20)
RDW: 14.6 % — ABNORMAL HIGH (ref 11.5–14.5)
WBC: 10.9 10*3/uL (ref 3.6–11.0)

## 2015-08-19 LAB — RHEUMATOID FACTOR: Rhuematoid fact SerPl-aCnc: <10 IU/mL (ref 0.0–13.9)

## 2015-08-19 LAB — ANTINUCLEAR ANTIBODIES, IFA: ANA Ab, IFA: NEGATIVE

## 2015-08-19 MED ORDER — OXYCODONE HCL 10 MG PO TABS: 10.0000 mg | ORAL_TABLET | Freq: Four times a day (QID) | ORAL | Status: DC | PRN

## 2015-08-19 MED ORDER — CALCIUM GLUCONATE 500 MG PO TABS: 1.0000 | ORAL_TABLET | Freq: Two times a day (BID) | ORAL | Status: DC

## 2015-08-19 MED ORDER — ALENDRONATE SODIUM 70 MG PO TABS: 70.0000 mg | ORAL_TABLET | ORAL | Status: DC

## 2015-08-19 MED ORDER — GABAPENTIN 300 MG PO CAPS: 300.0000 mg | ORAL_CAPSULE | Freq: Three times a day (TID) | ORAL | Status: AC

## 2015-08-19 MED ORDER — METOPROLOL TARTRATE 25 MG PO TABS
25.0000 mg | ORAL_TABLET | Freq: Once | ORAL | Status: AC
  Administered 2015-08-19: 25 mg via ORAL
  Filled 2015-08-19: qty 1

## 2015-08-19 MED ORDER — CALCIUM CARBONATE ANTACID 500 MG PO CHEW: 1.0000 | CHEWABLE_TABLET | Freq: Every day | ORAL | Status: DC

## 2015-08-19 MED ORDER — FERROUS SULFATE 325 (65 FE) MG PO TABS: 325.0000 mg | ORAL_TABLET | Freq: Two times a day (BID) | ORAL | Status: AC

## 2015-08-19 MED ORDER — CALCIUM GLUCONATE 500 MG PO TABS
1.0000 | ORAL_TABLET | Freq: Two times a day (BID) | ORAL | Status: DC
  Filled 2015-08-19 (×2): qty 1

## 2015-08-19 NOTE — Progress Notes (Signed)
Patient discharged to [facility]. Report called to Lanora Manis at facility, EMS called, family at bedside. IV removed. Waiting on transportation.

## 2015-08-19 NOTE — Progress Notes (Signed)
Physical Therapy Treatment Patient Details Name: Jill House MRN: [id number] DOB: 1924/07/18 Today's Date: 08/19/2015    History of Present Illness This patient is an 81 year old female who came to [facility] after 2 falls suffering a distal femur fracture on the left with ORIF repair. Also hairline fracture on R distal femur near her TKR. Pt is NWB bilaterally     PT Comments    Pt is very pleasant to work with and gives good effort during sessions. Pt was able to perform therex with assist from therapist, as well as perform slide board transfer to recliner. Pt states that mobility and exercises are feeling easier today. She has good support from her daughter who is present in the room. She will continue to benefit from skilled PT in order for her to return back to premorbid states.   Follow Up Recommendations  SNF     Equipment Recommendations   (slide board)    Recommendations for Other Services       Precautions / Restrictions Precautions Precautions: Fall;Knee Required Braces or Orthoses: Knee Immobilizer - Right;Knee Immobilizer - Left Knee Immobilizer - Right: On at all times Knee Immobilizer - Left: On at all times Restrictions Weight Bearing Restrictions: Yes RLE Weight Bearing: Non weight bearing LLE Weight Bearing: Non weight bearing    Mobility  Bed Mobility Overal bed mobility: +2 for physical assistance;Needs Assistance Bed Mobility:  (Scooting )           General bed mobility comments: Pt able to scoot laterally to EOB with mod assist while pulling on the chuck. Pt can manage LEs with increased time  Transfers Overall transfer level: Needs assistance Equipment used:  (slide board) Transfers: Lateral/Scoot Transfers          Lateral/Scoot Transfers: Mod assist;+2 physical assistance;Max assist General transfer comment: Pt requires assist to help her perform lateral movement on slide board transfer. Needs assist for management of LEs. Pt needs cues  for placement and advancement of hands (+2 assist )  Ambulation/Gait Ambulation/Gait assistance:  (Unsafe/not performed )               Stairs            Wheelchair Mobility    Modified Rankin (Stroke Patients Only)       Balance                                    Cognition Arousal/Alertness: Awake/alert Behavior During Therapy: WFL for tasks assessed/performed Overall Cognitive Status: Within Functional Limits for tasks assessed                      Exercises Other Exercises Other Exercises: Pt performed bilateral LE exercises x12 reps at min assist for facilitation of movement. Exerciese performed: ankle pumps, SLR, hip abd, and glute sets.     General Comments        Pertinent Vitals/Pain Pain Assessment: 0-10 Pain Score: 3  Pain Location: L hip  Pain Intervention(s): Limited activity within patient's tolerance;Monitored during session;Premedicated before session;Ice applied    Home Living                      Prior Function            PT Goals (current goals can now be found in the care plan section) Acute Rehab PT Goals Patient Stated Goal: to  sit in chair PT Goal Formulation: With patient/family Time For Goal Achievement: 09/01/15 Potential to Achieve Goals: Fair Progress towards PT goals: Progressing toward goals    Frequency  BID    PT Plan Current plan remains appropriate    Co-evaluation             End of Session   Activity Tolerance: Patient tolerated treatment well Patient left: in chair;with call bell/phone within reach;with chair alarm set;with family/visitor present;with SCD's reapplied     Time: 1610-9604 PT Time Calculation (min) (ACUTE ONLY): 34 min  Charges:                       G CodesBenna Dunks 09/15/15, 1:11 PM  Benna Dunks, SPT. [phone]

## 2015-08-19 NOTE — Progress Notes (Signed)
CSW provided SNF offers to patient and niece- bed accepted at [facility].  Niece /POA and patient agreeable to this plan- plan transfer via EMS. Reece Levy, MSW, Theresia Majors [phone]

## 2015-08-19 NOTE — Progress Notes (Signed)
Spoke with Leotis Shames, [facility] rep at [phone], to notify of non-emergent EMS transport.  Auth notification reference given as [id number].   Service date range good from 08/19/15 - 11/17/15.   Gap exception requested to determine if services can be considered at an in-network level.

## 2015-08-19 NOTE — Discharge Summary (Signed)
[facility] - [place] [facility]   PATIENT NAME: Jill House    MR#:  [id number]  DATE OF BIRTH:  1924-06-22  DATE OF ADMISSION:  08/13/2015 ADMITTING PHYSICIAN: Houston Siren, MD  DATE OF DISCHARGE: 08/19/2015  PRIMARY CARE PHYSICIAN: Margaretann Loveless, MD    ADMISSION DIAGNOSIS:  Hypoxia [R09.02] Left knee pain [M25.562] Femur fracture, left, closed, initial encounter [S72.92XA]  DISCHARGE DIAGNOSIS:  Active Problems:   Hip fracture   SECONDARY DIAGNOSIS:   Past Medical History  Diagnosis Date  . Atrial fibrillation   . Dementia   . Hip joint replacement status   . Knee joint replacement status   . Osteoarthritis   . Hypertension   . Urinary incontinence     HOSPITAL COURSE:   1. Left femoral shaft distal fracture closed initial encounter. This required operative repair by Dr. Deeann Saint. Patient was kept in the hospital for 3 days postoperatively and will go out to rehabilitation. We had to wait prior to surgery until her Eliquis got out of the system prior to operation. Patient also had platelet and blood transfusions prior to operation. Fosamax calcium and vitamin D prescribed. Patient also had one low-grade temperature of 100 but then quickly went back to normal. 2. Right femoral shaft hairline fracture. Medical management with immobilization. Patient is nonweightbearing as per Dr. Hyacinth Meeker until follow-up appointment. 3. Thrombocytopenia- this actually looks chronic to me patient was seen in consultation by hematology. We transfused platelets prior to the surgery. Platelet count was as low as 60,000. Upon discharge it is 137,000. 4. Anemia- the patient has a history of anemia patient received 3 units of packed red blood cells during the hospital course. Hemoglobin upon discharge 8.5. Iron prescribed upon discharge. Recommend checking a weekly CBC for right now. 5. Atrial fibrillation- patient's Eliquis was held prior to surgery and  restarted on 08/19/2015. Patient is on metoprolol for rate control. Her heart rate was a little bit elevated on the morning of 9/20 and low-dose metoprolol orally was given with good control of heart rate. 6. Dementia without behavioral disturbance- continue usual medications 7. Acute respiratory failure with hypoxia- pulse ox borderline at 88%. Oxygen as needed.   DISCHARGE CONDITIONS:   Satisfactory  CONSULTS OBTAINED:  Treatment Team:  Rosey Bath, MD Deeann Saint, MD  DRUG ALLERGIES:   Allergies  Allergen Reactions  . Keflex [Cephalexin] Other (See Comments)    Reaction:  Tremors   . Ciprofloxacin Rash and Swelling    DISCHARGE MEDICATIONS:   Current Discharge Medication List    START taking these medications   Details  alendronate (FOSAMAX) 70 MG tablet Take 1 tablet (70 mg total) by mouth once a week. Take with a full glass of water on an empty stomach.    calcium gluconate 500 MG tablet Take 1 tablet (500 mg total) by mouth 2 (two) times daily.    ferrous sulfate 325 (65 FE) MG tablet Take 1 tablet (325 mg total) by mouth 2 (two) times daily with a meal. Qty: 60 tablet, Refills: 3    gabapentin (NEURONTIN) 300 MG capsule Take 1 capsule (300 mg total) by mouth 3 (three) times daily.      CONTINUE these medications which have CHANGED   Details  Oxycodone HCl 10 MG TABS Take 1 tablet (10 mg total) by mouth every 6 (six) hours as needed. Qty: 60 tablet, Refills: 0      CONTINUE these medications which have NOT CHANGED  Details  apixaban (ELIQUIS) 2.5 MG TABS tablet Take 2.5 mg by mouth 2 (two) times daily.    cholecalciferol (VITAMIN D) 1000 UNITS tablet Take 1,000 Units by mouth daily.    cyanocobalamin (,VITAMIN B-12,) 1000 MCG/ML injection Inject 1,000 mcg into the muscle every 30 (thirty) days.    donepezil (ARICEPT) 10 MG tablet Take 10 mg by mouth daily.    memantine (NAMENDA) 5 MG tablet Take 5 mg by mouth daily.    metoprolol succinate  (TOPROL-XL) 100 MG 24 hr tablet Take 150 mg by mouth every evening.    oxybutynin (DITROPAN) 5 MG tablet Take 5 mg by mouth daily at 2 PM daily at 2 PM.    pantoprazole (PROTONIX) 40 MG tablet Take 40 mg by mouth every evening.    pravastatin (PRAVACHOL) 40 MG tablet Take 40 mg by mouth every evening.      STOP taking these medications     Fe Cbn-Fe Gluc-FA-B12-C-DSS (FERRALET 90) 90-1 MG TABS      furosemide (LASIX) 20 MG tablet      gabapentin (NEURONTIN) 600 MG tablet      potassium chloride (K-DUR) 10 MEQ tablet      raloxifene (EVISTA) 60 MG tablet          DISCHARGE INSTRUCTIONS:   Follow-up with Dr. at rehabilitation one day.  If you experience worsening of your admission symptoms, develop shortness of breath, life threatening emergency, suicidal or homicidal thoughts you must seek medical attention immediately by calling 911 or calling your MD immediately  if symptoms less severe.  You Must read complete instructions/literature along with all the possible adverse reactions/side effects for all the Medicines you take and that have been prescribed to you. Take any new Medicines after you have completely understood and accept all the possible adverse reactions/side effects.   Please note  You were cared for by a hospitalist during your hospital stay. If you have any questions about your discharge medications or the care you received while you were in the hospital after you are discharged, you can call the unit and asked to speak with the hospitalist on call if the hospitalist that took care of you is not available. Once you are discharged, your primary care physician will handle any further medical issues. Please note that NO REFILLS for any discharge medications will be authorized once you are discharged, as it is imperative that you return to your primary care physician (or establish a relationship with a primary care physician if you do not have one) for your aftercare  needs so that they can reassess your need for medications and monitor your lab values.    Today   CHIEF COMPLAINT:   Chief Complaint  Patient presents with  . Knee Injury    HISTORY OF PRESENT ILLNESS:  Jill House  is a 81 y.o. female with a known history of dementia presented after a fall and having pain in the leg. She was found to have a left femoral fracture.   VITAL SIGNS:  Blood pressure 107/76, pulse 109, temperature 97.5 F (36.4 C), temperature source Core (Comment), resp. rate 18, height  (1.626 m), weight 71.169 kg (156 lb 14.4 oz), SpO2 99 %.    PHYSICAL EXAMINATION:  GENERAL:  81 y.o.-year-old patient lying in the bed with no acute distress.  EYES: Pupils equal, round, reactive to light and accommodation. No scleral icterus. Extraocular muscles intact.  HEENT: Head atraumatic, normocephalic. Oropharynx and nasopharynx clear.  NECK:  Supple, no jugular venous distention. No thyroid enlargement, no tenderness.  LUNGS: Normal breath sounds bilaterally, no wheezing, rales,rhonchi or crepitation. No use of accessory muscles of respiration.  CARDIOVASCULAR: S1, S2 tachycardia. 2/6 systolic murmurs, no rubs, or gallops.  ABDOMEN: Soft, non-tender, non-distended. Bowel sounds present. No organomegaly or mass.  EXTREMITIES: No pedal edema, cyanosis, or clubbing.  NEUROLOGIC: Cranial nerves II through XII are intact. Patient does have weakness in flexion of left toes and extension of left toes. Sensation intact. Gait not checked.  PSYCHIATRIC: The patient is alert.  SKIN: No obvious rash, lesion, or ulcer.   DATA REVIEW:   CBC  Recent Labs Lab 08/19/15 0454  WBC 10.9  HGB 8.5*  HCT 25.3*  PLT 137*    Chemistries   Recent Labs Lab 08/13/15 1335  08/18/15 0337  NA 143  < > 137  K 4.3  < > 4.2  CL 103  < > 104  CO2 30  < > 28  GLUCOSE 159*  < > 120*  BUN 26*  < > 28*  CREATININE 1.55*  < > 1.30*  CALCIUM 9.0  < > 8.0*  AST 32  --   --   ALT  14  --   --   ALKPHOS 64  --   --   BILITOT 0.8  --   --   < > = values in this interval not displayed.   Microbiology Results  Results for orders placed or performed during the hospital encounter of 08/13/15  MRSA PCR Screening     Status: None   Collection Time: 08/13/15  3:45 PM  Result Value Ref Range Status   MRSA by PCR NEGATIVE NEGATIVE Final    Comment:        The GeneXpert MRSA Assay (FDA approved for NASAL specimens only), is one component of a comprehensive MRSA colonization surveillance program. It is not intended to diagnose MRSA infection nor to guide or monitor treatment for MRSA infections.      Management plans discussed with the patient, family and they are in agreement.  CODE STATUS:     Code Status Orders        Start     Ordered   08/16/15 1814  Do not attempt resuscitation (DNR)   Continuous    Question Answer Comment  In the event of cardiac or respiratory ARREST Do not call a "code blue"   In the event of cardiac or respiratory ARREST Do not perform Intubation, CPR, defibrillation or ACLS   In the event of cardiac or respiratory ARREST Use medication by any route, position, wound care, and other measures to relive pain and suffering. May use oxygen, suction and manual treatment of airway obstruction as needed for comfort.      08/16/15 1813    Advance Directive Documentation        Most Recent Value   Type of Advance Directive  Healthcare Power of Attorney   Pre-existing out of facility DNR order (yellow form or pink MOST form)     "MOST" Form in Place?        TOTAL TIME TAKING CARE OF THIS PATIENT: 35 minutes.    Alford Highland M.D on 08/19/2015 at 10:15 AM  Between 7am to 6pm - Pager - [phone]  After 6pm go to [email or web address] - password [email or web address] [facility]  [facility]  Office  [phone]  CC: Primary care physician; Margaretann Loveless, MD

## 2015-08-22 ENCOUNTER — Ambulatory Visit
Admission: RE | Admit: 2015-08-22 | Discharge: 2015-08-22 | Disposition: A | Discharge status: Home / Self Care | Payer: Medicare Other | Source: Ambulatory Visit | Attending: Gerontology | Admitting: Gerontology

## 2015-08-22 ENCOUNTER — Other Ambulatory Visit: Payer: Self-pay | Admitting: Gerontology

## 2015-08-22 DIAGNOSIS — I700 Atherosclerosis of aorta: Secondary | ICD-10-CM | POA: Insufficient documentation

## 2015-08-22 DIAGNOSIS — R0602 Shortness of breath: Secondary | ICD-10-CM

## 2015-08-22 DIAGNOSIS — I313 Pericardial effusion (noninflammatory): Secondary | ICD-10-CM | POA: Insufficient documentation

## 2015-08-22 DIAGNOSIS — R0600 Dyspnea, unspecified: Secondary | ICD-10-CM | POA: Diagnosis present

## 2015-08-22 DIAGNOSIS — J90 Pleural effusion, not elsewhere classified: Secondary | ICD-10-CM | POA: Insufficient documentation

## 2015-08-22 DIAGNOSIS — I2510 Atherosclerotic heart disease of native coronary artery without angina pectoris: Secondary | ICD-10-CM | POA: Diagnosis not present

## 2015-08-22 MED ORDER — IOHEXOL 350 MG/ML SOLN
75.0000 mL | Freq: Once | INTRAVENOUS | Status: AC | PRN
  Administered 2015-08-22: 60 mL via INTRAVENOUS

## 2015-08-26 DIAGNOSIS — D649 Anemia, unspecified: Secondary | ICD-10-CM | POA: Diagnosis present

## 2015-08-26 LAB — CBC WITH DIFFERENTIAL/PLATELET
BASOS ABS: 0.1 10*3/uL (ref 0–0.1)
BASOS PCT: 1 %
Eosinophils Absolute: 0.2 10*3/uL (ref 0–0.7)
Eosinophils Relative: 2 %
HEMATOCRIT: 30.6 % — AB (ref 35.0–47.0)
HEMOGLOBIN: 10.2 g/dL — AB (ref 12.0–16.0)
Lymphocytes Relative: 5 %
Lymphs Abs: 0.4 10*3/uL — ABNORMAL LOW (ref 1.0–3.6)
MCH: 30.7 pg (ref 26.0–34.0)
MCHC: 33.2 g/dL (ref 32.0–36.0)
MCV: 92.5 fL (ref 80.0–100.0)
MONOS PCT: 7 %
Monocytes Absolute: 0.5 10*3/uL (ref 0.2–0.9)
NEUTROS ABS: 6.7 10*3/uL — AB (ref 1.4–6.5)
NEUTROS PCT: 85 %
Platelets: 215 10*3/uL (ref 150–440)
RBC: 3.31 MIL/uL — AB (ref 3.80–5.20)
RDW: 15.4 % — ABNORMAL HIGH (ref 11.5–14.5)
WBC: 7.9 10*3/uL (ref 3.6–11.0)

## 2015-08-30 ENCOUNTER — Encounter
Admission: RE | Admit: 2015-08-30 | Discharge: 2015-08-30 | Disposition: A | Discharge status: Home / Self Care | Payer: Medicare Other | Source: Ambulatory Visit | Attending: Internal Medicine | Admitting: Internal Medicine

## 2015-08-30 DIAGNOSIS — R410 Disorientation, unspecified: Secondary | ICD-10-CM | POA: Insufficient documentation

## 2015-08-30 DIAGNOSIS — I4891 Unspecified atrial fibrillation: Secondary | ICD-10-CM | POA: Insufficient documentation

## 2015-08-30 DIAGNOSIS — D649 Anemia, unspecified: Secondary | ICD-10-CM | POA: Insufficient documentation

## 2015-09-01 ENCOUNTER — Other Ambulatory Visit
Admission: RE | Admit: 2015-09-01 | Discharge: 2015-09-01 | Disposition: A | Discharge status: Home / Self Care | Payer: Medicare Other | Source: Skilled Nursing Facility | Attending: Internal Medicine | Admitting: Internal Medicine

## 2015-09-01 DIAGNOSIS — R350 Frequency of micturition: Secondary | ICD-10-CM | POA: Insufficient documentation

## 2015-09-01 LAB — URINALYSIS COMPLETE WITH MICROSCOPIC (ARMC ONLY)
BACTERIA UA: NONE SEEN
Bilirubin Urine: NEGATIVE
Glucose, UA: NEGATIVE mg/dL
HGB URINE DIPSTICK: NEGATIVE
Ketones, ur: NEGATIVE mg/dL
LEUKOCYTES UA: NEGATIVE
Nitrite: NEGATIVE
PROTEIN: NEGATIVE mg/dL
SPECIFIC GRAVITY, URINE: 1.012 (ref 1.005–1.030)
pH: 5 (ref 5.0–8.0)

## 2015-09-02 LAB — URINE CULTURE

## 2015-09-04 DIAGNOSIS — I4891 Unspecified atrial fibrillation: Secondary | ICD-10-CM | POA: Diagnosis not present

## 2015-09-04 DIAGNOSIS — R410 Disorientation, unspecified: Secondary | ICD-10-CM | POA: Diagnosis present

## 2015-09-04 DIAGNOSIS — D649 Anemia, unspecified: Secondary | ICD-10-CM | POA: Diagnosis not present

## 2015-09-04 LAB — URINALYSIS COMPLETE WITH MICROSCOPIC (ARMC ONLY)
BILIRUBIN URINE: NEGATIVE
Bacteria, UA: NONE SEEN
GLUCOSE, UA: NEGATIVE mg/dL
HGB URINE DIPSTICK: NEGATIVE
KETONES UR: NEGATIVE mg/dL
Leukocytes, UA: NEGATIVE
NITRITE: NEGATIVE
PH: 5 (ref 5.0–8.0)
Protein, ur: NEGATIVE mg/dL
RBC / HPF: NONE SEEN RBC/hpf (ref 0–5)
Specific Gravity, Urine: 1.011 (ref 1.005–1.030)
WBC UA: NONE SEEN WBC/hpf (ref 0–5)

## 2015-09-06 LAB — URINE CULTURE: CULTURE: NO GROWTH

## 2015-09-12 DIAGNOSIS — R410 Disorientation, unspecified: Secondary | ICD-10-CM | POA: Diagnosis not present

## 2015-09-12 LAB — CBC WITH DIFFERENTIAL/PLATELET
Basophils Absolute: 0.1 10*3/uL (ref 0–0.1)
Basophils Relative: 1 %
EOS ABS: 0.1 10*3/uL (ref 0–0.7)
Eosinophils Relative: 2 %
HCT: 40.1 % (ref 35.0–47.0)
HEMOGLOBIN: 13.2 g/dL (ref 12.0–16.0)
LYMPHS ABS: 0.8 10*3/uL — AB (ref 1.0–3.6)
LYMPHS PCT: 14 %
MCH: 30.8 pg (ref 26.0–34.0)
MCHC: 32.8 g/dL (ref 32.0–36.0)
MCV: 93.8 fL (ref 80.0–100.0)
Monocytes Absolute: 0.6 10*3/uL (ref 0.2–0.9)
Monocytes Relative: 10 %
NEUTROS PCT: 73 %
Neutro Abs: 4.1 10*3/uL (ref 1.4–6.5)
Platelets: 129 10*3/uL — ABNORMAL LOW (ref 150–440)
RBC: 4.27 MIL/uL (ref 3.80–5.20)
RDW: 16.4 % — ABNORMAL HIGH (ref 11.5–14.5)
WBC: 5.6 10*3/uL (ref 3.6–11.0)

## 2015-09-12 LAB — COMPREHENSIVE METABOLIC PANEL
ALK PHOS: 125 U/L (ref 38–126)
ALT: 8 U/L — AB (ref 14–54)
AST: 24 U/L (ref 15–41)
Albumin: 3.6 g/dL (ref 3.5–5.0)
Anion gap: 9 (ref 5–15)
BUN: 49 mg/dL — AB (ref 6–20)
CALCIUM: 9.3 mg/dL (ref 8.9–10.3)
CO2: 30 mmol/L (ref 22–32)
CREATININE: 1.76 mg/dL — AB (ref 0.44–1.00)
Chloride: 102 mmol/L (ref 101–111)
GFR calc non Af Amer: 24 mL/min — ABNORMAL LOW (ref >60)
GFR, EST AFRICAN AMERICAN: 28 mL/min — AB (ref >60)
GLUCOSE: 113 mg/dL — AB (ref 65–99)
Potassium: 4.1 mmol/L (ref 3.5–5.1)
SODIUM: 141 mmol/L (ref 135–145)
Total Bilirubin: 1.1 mg/dL (ref 0.3–1.2)
Total Protein: 7.1 g/dL (ref 6.5–8.1)

## 2015-09-14 DIAGNOSIS — R410 Disorientation, unspecified: Secondary | ICD-10-CM | POA: Diagnosis not present

## 2015-09-15 LAB — COMPREHENSIVE METABOLIC PANEL
ALBUMIN: 3.1 g/dL — AB (ref 3.5–5.0)
ALT: 7 U/L — AB (ref 14–54)
AST: 14 U/L — AB (ref 15–41)
Alkaline Phosphatase: 100 U/L (ref 38–126)
Anion gap: 5 (ref 5–15)
BUN: 40 mg/dL — AB (ref 6–20)
CHLORIDE: 102 mmol/L (ref 101–111)
CO2: 28 mmol/L (ref 22–32)
CREATININE: 1.43 mg/dL — AB (ref 0.44–1.00)
Calcium: 8.3 mg/dL — ABNORMAL LOW (ref 8.9–10.3)
GFR calc Af Amer: 36 mL/min — ABNORMAL LOW (ref >60)
GFR calc non Af Amer: 31 mL/min — ABNORMAL LOW (ref >60)
GLUCOSE: 91 mg/dL (ref 65–99)
POTASSIUM: 4.3 mmol/L (ref 3.5–5.1)
Sodium: 135 mmol/L (ref 135–145)
Total Bilirubin: 0.7 mg/dL (ref 0.3–1.2)
Total Protein: 5.9 g/dL — ABNORMAL LOW (ref 6.5–8.1)

## 2015-09-15 LAB — CBC
HEMATOCRIT: 34.6 % — AB (ref 35.0–47.0)
Hemoglobin: 11.3 g/dL — ABNORMAL LOW (ref 12.0–16.0)
MCH: 30.5 pg (ref 26.0–34.0)
MCHC: 32.8 g/dL (ref 32.0–36.0)
MCV: 92.9 fL (ref 80.0–100.0)
PLATELETS: 97 10*3/uL — AB (ref 150–440)
RBC: 3.72 MIL/uL — AB (ref 3.80–5.20)
RDW: 16.1 % — AB (ref 11.5–14.5)
WBC: 5.9 10*3/uL (ref 3.6–11.0)

## 2015-09-16 DIAGNOSIS — R410 Disorientation, unspecified: Secondary | ICD-10-CM | POA: Diagnosis not present

## 2015-09-16 LAB — CBC WITH DIFFERENTIAL/PLATELET
BASOS ABS: 0.1 10*3/uL (ref 0–0.1)
Basophils Relative: 1 %
Eosinophils Absolute: 0.2 10*3/uL (ref 0–0.7)
Eosinophils Relative: 3 %
HEMATOCRIT: 35.8 % (ref 35.0–47.0)
Hemoglobin: 11.8 g/dL — ABNORMAL LOW (ref 12.0–16.0)
LYMPHS ABS: 0.7 10*3/uL — AB (ref 1.0–3.6)
LYMPHS PCT: 12 %
MCH: 30.5 pg (ref 26.0–34.0)
MCHC: 32.8 g/dL (ref 32.0–36.0)
MCV: 93.1 fL (ref 80.0–100.0)
MONO ABS: 0.7 10*3/uL (ref 0.2–0.9)
Monocytes Relative: 12 %
NEUTROS ABS: 4.2 10*3/uL (ref 1.4–6.5)
Neutrophils Relative %: 72 %
Platelets: 102 10*3/uL — ABNORMAL LOW (ref 150–440)
RBC: 3.85 MIL/uL (ref 3.80–5.20)
RDW: 16 % — ABNORMAL HIGH (ref 11.5–14.5)
WBC: 5.8 10*3/uL (ref 3.6–11.0)

## 2015-09-16 LAB — COMPREHENSIVE METABOLIC PANEL
ALK PHOS: 106 U/L (ref 38–126)
ALT: 8 U/L — AB (ref 14–54)
AST: 24 U/L (ref 15–41)
Albumin: 3.2 g/dL — ABNORMAL LOW (ref 3.5–5.0)
Anion gap: 4 — ABNORMAL LOW (ref 5–15)
BILIRUBIN TOTAL: 0.9 mg/dL (ref 0.3–1.2)
BUN: 34 mg/dL — AB (ref 6–20)
CHLORIDE: 104 mmol/L (ref 101–111)
CO2: 28 mmol/L (ref 22–32)
Calcium: 8.7 mg/dL — ABNORMAL LOW (ref 8.9–10.3)
Creatinine, Ser: 1.42 mg/dL — ABNORMAL HIGH (ref 0.44–1.00)
GFR, EST AFRICAN AMERICAN: 37 mL/min — AB (ref >60)
GFR, EST NON AFRICAN AMERICAN: 32 mL/min — AB (ref >60)
Glucose, Bld: 114 mg/dL — ABNORMAL HIGH (ref 65–99)
Potassium: 4.3 mmol/L (ref 3.5–5.1)
Sodium: 136 mmol/L (ref 135–145)
TOTAL PROTEIN: 6.3 g/dL — AB (ref 6.5–8.1)

## 2015-09-23 DIAGNOSIS — R410 Disorientation, unspecified: Secondary | ICD-10-CM | POA: Diagnosis not present

## 2015-09-23 LAB — CBC WITH DIFFERENTIAL/PLATELET
BASOS ABS: 0.1 10*3/uL (ref 0–0.1)
Basophils Relative: 1 %
EOS ABS: 0.1 10*3/uL (ref 0–0.7)
EOS PCT: 1 %
HCT: 35.2 % (ref 35.0–47.0)
Hemoglobin: 11.4 g/dL — ABNORMAL LOW (ref 12.0–16.0)
Lymphocytes Relative: 10 %
Lymphs Abs: 0.7 10*3/uL — ABNORMAL LOW (ref 1.0–3.6)
MCH: 30.2 pg (ref 26.0–34.0)
MCHC: 32.4 g/dL (ref 32.0–36.0)
MCV: 93.1 fL (ref 80.0–100.0)
Monocytes Absolute: 0.7 10*3/uL (ref 0.2–0.9)
Monocytes Relative: 10 %
Neutro Abs: 5.6 10*3/uL (ref 1.4–6.5)
Neutrophils Relative %: 78 %
PLATELETS: 104 10*3/uL — AB (ref 150–440)
RBC: 3.78 MIL/uL — AB (ref 3.80–5.20)
RDW: 16.1 % — ABNORMAL HIGH (ref 11.5–14.5)
WBC: 7.2 10*3/uL (ref 3.6–11.0)

## 2015-09-23 LAB — COMPREHENSIVE METABOLIC PANEL
ALT: 7 U/L — AB (ref 14–54)
AST: 19 U/L (ref 15–41)
Albumin: 3.4 g/dL — ABNORMAL LOW (ref 3.5–5.0)
Alkaline Phosphatase: 96 U/L (ref 38–126)
Anion gap: 10 (ref 5–15)
BUN: 41 mg/dL — ABNORMAL HIGH (ref 6–20)
CHLORIDE: 100 mmol/L — AB (ref 101–111)
CO2: 28 mmol/L (ref 22–32)
CREATININE: 1.59 mg/dL — AB (ref 0.44–1.00)
Calcium: 8.8 mg/dL — ABNORMAL LOW (ref 8.9–10.3)
GFR calc non Af Amer: 28 mL/min — ABNORMAL LOW (ref >60)
GFR, EST AFRICAN AMERICAN: 32 mL/min — AB (ref >60)
Glucose, Bld: 86 mg/dL (ref 65–99)
Potassium: 4.5 mmol/L (ref 3.5–5.1)
SODIUM: 138 mmol/L (ref 135–145)
Total Bilirubin: 1.2 mg/dL (ref 0.3–1.2)
Total Protein: 6.6 g/dL (ref 6.5–8.1)

## 2015-09-30 ENCOUNTER — Encounter: Admit: 2015-09-30 | Payer: Medicare Other

## 2015-09-30 ENCOUNTER — Encounter
Admission: RE | Admit: 2015-09-30 | Discharge: 2015-09-30 | Disposition: A | Discharge status: Home / Self Care | Payer: Medicare Other | Source: Ambulatory Visit | Attending: Internal Medicine | Admitting: Internal Medicine

## 2015-09-30 DIAGNOSIS — D649 Anemia, unspecified: Secondary | ICD-10-CM | POA: Diagnosis present

## 2015-09-30 DIAGNOSIS — R300 Dysuria: Secondary | ICD-10-CM | POA: Diagnosis not present

## 2015-09-30 DIAGNOSIS — I4891 Unspecified atrial fibrillation: Secondary | ICD-10-CM | POA: Insufficient documentation

## 2015-09-30 LAB — COMPREHENSIVE METABOLIC PANEL
ALT: 8 U/L — ABNORMAL LOW (ref 14–54)
ANION GAP: 9 (ref 5–15)
AST: 21 U/L (ref 15–41)
Albumin: 4 g/dL (ref 3.5–5.0)
Alkaline Phosphatase: 100 U/L (ref 38–126)
BUN: 45 mg/dL — ABNORMAL HIGH (ref 6–20)
CO2: 28 mmol/L (ref 22–32)
Calcium: 9.2 mg/dL (ref 8.9–10.3)
Chloride: 102 mmol/L (ref 101–111)
Creatinine, Ser: 1.9 mg/dL — ABNORMAL HIGH (ref 0.44–1.00)
GFR calc non Af Amer: 22 mL/min — ABNORMAL LOW (ref >60)
GFR, EST AFRICAN AMERICAN: 26 mL/min — AB (ref >60)
Glucose, Bld: 104 mg/dL — ABNORMAL HIGH (ref 65–99)
Potassium: 4.6 mmol/L (ref 3.5–5.1)
SODIUM: 139 mmol/L (ref 135–145)
Total Bilirubin: 1.3 mg/dL — ABNORMAL HIGH (ref 0.3–1.2)
Total Protein: 7.6 g/dL (ref 6.5–8.1)

## 2015-09-30 LAB — CBC WITH DIFFERENTIAL/PLATELET
Basophils Absolute: 0.1 10*3/uL (ref 0–0.1)
Basophils Relative: 1 %
EOS ABS: 0.1 10*3/uL (ref 0–0.7)
EOS PCT: 1 %
HCT: 38.8 % (ref 35.0–47.0)
Hemoglobin: 12.8 g/dL (ref 12.0–16.0)
LYMPHS ABS: 0.7 10*3/uL — AB (ref 1.0–3.6)
Lymphocytes Relative: 12 %
MCH: 30.8 pg (ref 26.0–34.0)
MCHC: 33 g/dL (ref 32.0–36.0)
MCV: 93.5 fL (ref 80.0–100.0)
MONO ABS: 0.7 10*3/uL (ref 0.2–0.9)
MONOS PCT: 12 %
Neutro Abs: 4.6 10*3/uL (ref 1.4–6.5)
Neutrophils Relative %: 74 %
PLATELETS: 143 10*3/uL — AB (ref 150–440)
RBC: 4.15 MIL/uL (ref 3.80–5.20)
RDW: 15.4 % — AB (ref 11.5–14.5)
WBC: 6.2 10*3/uL (ref 3.6–11.0)

## 2015-10-05 DIAGNOSIS — I4891 Unspecified atrial fibrillation: Secondary | ICD-10-CM | POA: Diagnosis not present

## 2015-10-05 LAB — URINALYSIS COMPLETE WITH MICROSCOPIC (ARMC ONLY)
Bilirubin Urine: NEGATIVE
GLUCOSE, UA: NEGATIVE mg/dL
Ketones, ur: NEGATIVE mg/dL
Nitrite: NEGATIVE
PH: 5 (ref 5.0–8.0)
Protein, ur: NEGATIVE mg/dL
Specific Gravity, Urine: 1.009 (ref 1.005–1.030)

## 2015-10-07 DIAGNOSIS — I4891 Unspecified atrial fibrillation: Secondary | ICD-10-CM | POA: Diagnosis not present

## 2015-10-07 LAB — COMPREHENSIVE METABOLIC PANEL
ALBUMIN: 3.5 g/dL (ref 3.5–5.0)
ALT: 8 U/L — ABNORMAL LOW (ref 14–54)
ANION GAP: 7 (ref 5–15)
AST: 18 U/L (ref 15–41)
Alkaline Phosphatase: 90 U/L (ref 38–126)
BILIRUBIN TOTAL: 0.9 mg/dL (ref 0.3–1.2)
BUN: 43 mg/dL — AB (ref 6–20)
CHLORIDE: 105 mmol/L (ref 101–111)
CO2: 27 mmol/L (ref 22–32)
Calcium: 8.7 mg/dL — ABNORMAL LOW (ref 8.9–10.3)
Creatinine, Ser: 1.72 mg/dL — ABNORMAL HIGH (ref 0.44–1.00)
GFR calc Af Amer: 29 mL/min — ABNORMAL LOW (ref >60)
GFR calc non Af Amer: 25 mL/min — ABNORMAL LOW (ref >60)
GLUCOSE: 118 mg/dL — AB (ref 65–99)
POTASSIUM: 4.2 mmol/L (ref 3.5–5.1)
Sodium: 139 mmol/L (ref 135–145)
TOTAL PROTEIN: 6.5 g/dL (ref 6.5–8.1)

## 2015-10-07 LAB — CBC WITH DIFFERENTIAL/PLATELET
BASOS ABS: 0.1 10*3/uL (ref 0–0.1)
BASOS PCT: 1 %
EOS ABS: 0.1 10*3/uL (ref 0–0.7)
Eosinophils Relative: 3 %
HEMATOCRIT: 35.5 % (ref 35.0–47.0)
HEMOGLOBIN: 11.8 g/dL — AB (ref 12.0–16.0)
LYMPHS ABS: 0.9 10*3/uL — AB (ref 1.0–3.6)
LYMPHS PCT: 15 %
MCH: 31 pg (ref 26.0–34.0)
MCHC: 33.2 g/dL (ref 32.0–36.0)
MCV: 93.4 fL (ref 80.0–100.0)
MONO ABS: 0.6 10*3/uL (ref 0.2–0.9)
Monocytes Relative: 11 %
NEUTROS ABS: 4 10*3/uL (ref 1.4–6.5)
Neutrophils Relative %: 70 %
Platelets: 124 10*3/uL — ABNORMAL LOW (ref 150–440)
RBC: 3.8 MIL/uL (ref 3.80–5.20)
RDW: 15.4 % — ABNORMAL HIGH (ref 11.5–14.5)
WBC: 5.6 10*3/uL (ref 3.6–11.0)

## 2015-10-09 LAB — URINE CULTURE

## 2015-10-14 DIAGNOSIS — I4891 Unspecified atrial fibrillation: Secondary | ICD-10-CM | POA: Diagnosis not present

## 2015-10-14 LAB — CBC WITH DIFFERENTIAL/PLATELET
BASOS ABS: 0 10*3/uL (ref 0–0.1)
Basophils Relative: 1 %
Eosinophils Absolute: 0.1 10*3/uL (ref 0–0.7)
Eosinophils Relative: 3 %
HEMATOCRIT: 32.4 % — AB (ref 35.0–47.0)
Hemoglobin: 10.5 g/dL — ABNORMAL LOW (ref 12.0–16.0)
LYMPHS PCT: 19 %
Lymphs Abs: 0.9 10*3/uL — ABNORMAL LOW (ref 1.0–3.6)
MCH: 30.5 pg (ref 26.0–34.0)
MCHC: 32.5 g/dL (ref 32.0–36.0)
MCV: 93.8 fL (ref 80.0–100.0)
MONO ABS: 0.6 10*3/uL (ref 0.2–0.9)
MONOS PCT: 12 %
NEUTROS ABS: 3.2 10*3/uL (ref 1.4–6.5)
Neutrophils Relative %: 65 %
PLATELETS: 100 10*3/uL — AB (ref 150–440)
RBC: 3.45 MIL/uL — ABNORMAL LOW (ref 3.80–5.20)
RDW: 15.4 % — AB (ref 11.5–14.5)
WBC: 4.9 10*3/uL (ref 3.6–11.0)

## 2015-10-14 LAB — COMPREHENSIVE METABOLIC PANEL
ALT: 8 U/L — ABNORMAL LOW (ref 14–54)
ANION GAP: 8 (ref 5–15)
AST: 17 U/L (ref 15–41)
Albumin: 3.2 g/dL — ABNORMAL LOW (ref 3.5–5.0)
Alkaline Phosphatase: 77 U/L (ref 38–126)
BILIRUBIN TOTAL: 0.5 mg/dL (ref 0.3–1.2)
BUN: 38 mg/dL — ABNORMAL HIGH (ref 6–20)
CALCIUM: 9.2 mg/dL (ref 8.9–10.3)
CO2: 27 mmol/L (ref 22–32)
Chloride: 105 mmol/L (ref 101–111)
Creatinine, Ser: 1.91 mg/dL — ABNORMAL HIGH (ref 0.44–1.00)
GFR calc non Af Amer: 22 mL/min — ABNORMAL LOW (ref >60)
GFR, EST AFRICAN AMERICAN: 26 mL/min — AB (ref >60)
GLUCOSE: 88 mg/dL (ref 65–99)
Potassium: 4.7 mmol/L (ref 3.5–5.1)
Sodium: 140 mmol/L (ref 135–145)
TOTAL PROTEIN: 6.1 g/dL — AB (ref 6.5–8.1)

## 2015-10-21 DIAGNOSIS — I4891 Unspecified atrial fibrillation: Secondary | ICD-10-CM | POA: Diagnosis not present

## 2015-10-21 LAB — CBC WITH DIFFERENTIAL/PLATELET
BASOS ABS: 0 10*3/uL (ref 0–0.1)
Eosinophils Absolute: 0.2 10*3/uL (ref 0–0.7)
Eosinophils Relative: 4 %
HEMATOCRIT: 32.5 % — AB (ref 35.0–47.0)
HEMOGLOBIN: 10.6 g/dL — AB (ref 12.0–16.0)
Lymphs Abs: 0.6 10*3/uL — ABNORMAL LOW (ref 1.0–3.6)
MCH: 30.7 pg (ref 26.0–34.0)
MCHC: 32.6 g/dL (ref 32.0–36.0)
MCV: 94.1 fL (ref 80.0–100.0)
MONO ABS: 0.5 10*3/uL (ref 0.2–0.9)
Monocytes Relative: 11 %
NEUTROS ABS: 2.9 10*3/uL (ref 1.4–6.5)
Platelets: 93 10*3/uL — ABNORMAL LOW (ref 150–440)
RBC: 3.45 MIL/uL — AB (ref 3.80–5.20)
RDW: 14.9 % — AB (ref 11.5–14.5)
WBC: 4.2 10*3/uL (ref 3.6–11.0)

## 2015-10-21 LAB — COMPREHENSIVE METABOLIC PANEL
ALBUMIN: 3 g/dL — AB (ref 3.5–5.0)
ALK PHOS: 61 U/L (ref 38–126)
ALT: 8 U/L — AB (ref 14–54)
AST: 17 U/L (ref 15–41)
Anion gap: 4 — ABNORMAL LOW (ref 5–15)
BILIRUBIN TOTAL: 0.4 mg/dL (ref 0.3–1.2)
BUN: 30 mg/dL — AB (ref 6–20)
CO2: 30 mmol/L (ref 22–32)
CREATININE: 1.35 mg/dL — AB (ref 0.44–1.00)
Calcium: 8.3 mg/dL — ABNORMAL LOW (ref 8.9–10.3)
Chloride: 107 mmol/L (ref 101–111)
GFR calc Af Amer: 39 mL/min — ABNORMAL LOW (ref >60)
GFR, EST NON AFRICAN AMERICAN: 34 mL/min — AB (ref >60)
GLUCOSE: 78 mg/dL (ref 65–99)
POTASSIUM: 4.4 mmol/L (ref 3.5–5.1)
Sodium: 141 mmol/L (ref 135–145)
TOTAL PROTEIN: 5.8 g/dL — AB (ref 6.5–8.1)

## 2015-10-28 ENCOUNTER — Ambulatory Visit: Payer: Medicare Other

## 2015-10-28 ENCOUNTER — Ambulatory Visit: Payer: Medicare Other | Admitting: Sports Medicine

## 2015-10-28 DIAGNOSIS — I4891 Unspecified atrial fibrillation: Secondary | ICD-10-CM | POA: Diagnosis not present

## 2015-10-28 LAB — COMPREHENSIVE METABOLIC PANEL
ALK PHOS: 57 U/L (ref 38–126)
ALT: 7 U/L — AB (ref 14–54)
AST: 17 U/L (ref 15–41)
Albumin: 3.1 g/dL — ABNORMAL LOW (ref 3.5–5.0)
Anion gap: 8 (ref 5–15)
BUN: 35 mg/dL — ABNORMAL HIGH (ref 6–20)
CALCIUM: 8.7 mg/dL — AB (ref 8.9–10.3)
CO2: 30 mmol/L (ref 22–32)
CREATININE: 1.33 mg/dL — AB (ref 0.44–1.00)
Chloride: 106 mmol/L (ref 101–111)
GFR calc non Af Amer: 34 mL/min — ABNORMAL LOW (ref >60)
GFR, EST AFRICAN AMERICAN: 40 mL/min — AB (ref >60)
Glucose, Bld: 81 mg/dL (ref 65–99)
Potassium: 4.2 mmol/L (ref 3.5–5.1)
SODIUM: 144 mmol/L (ref 135–145)
Total Protein: 6.3 g/dL — ABNORMAL LOW (ref 6.5–8.1)

## 2015-10-28 LAB — CBC WITH DIFFERENTIAL/PLATELET
BASOS PCT: 1 %
Basophils Absolute: 0.1 10*3/uL (ref 0–0.1)
EOS ABS: 0.2 10*3/uL (ref 0–0.7)
Eosinophils Relative: 3 %
HCT: 34.3 % — ABNORMAL LOW (ref 35.0–47.0)
Hemoglobin: 11 g/dL — ABNORMAL LOW (ref 12.0–16.0)
Lymphocytes Relative: 15 %
Lymphs Abs: 0.8 10*3/uL — ABNORMAL LOW (ref 1.0–3.6)
MCH: 30.1 pg (ref 26.0–34.0)
MCHC: 32 g/dL (ref 32.0–36.0)
MCV: 94.1 fL (ref 80.0–100.0)
MONO ABS: 0.7 10*3/uL (ref 0.2–0.9)
MONOS PCT: 13 %
NEUTROS PCT: 68 %
Neutro Abs: 3.6 10*3/uL (ref 1.4–6.5)
Platelets: 117 10*3/uL — ABNORMAL LOW (ref 150–440)
RBC: 3.65 MIL/uL — ABNORMAL LOW (ref 3.80–5.20)
RDW: 14.5 % (ref 11.5–14.5)
WBC: 5.2 10*3/uL (ref 3.6–11.0)

## 2015-10-30 ENCOUNTER — Encounter
Admission: RE | Admit: 2015-10-30 | Discharge: 2015-10-30 | Disposition: A | Discharge status: Home / Self Care | Payer: Medicare Other | Source: Ambulatory Visit | Attending: Internal Medicine | Admitting: Internal Medicine

## 2015-10-30 DIAGNOSIS — I4891 Unspecified atrial fibrillation: Secondary | ICD-10-CM | POA: Insufficient documentation

## 2015-10-30 DIAGNOSIS — D649 Anemia, unspecified: Secondary | ICD-10-CM | POA: Insufficient documentation

## 2015-10-30 DIAGNOSIS — R410 Disorientation, unspecified: Secondary | ICD-10-CM | POA: Insufficient documentation

## 2015-10-30 DIAGNOSIS — R39198 Other difficulties with micturition: Secondary | ICD-10-CM | POA: Insufficient documentation

## 2015-11-04 DIAGNOSIS — R39198 Other difficulties with micturition: Secondary | ICD-10-CM | POA: Diagnosis not present

## 2015-11-04 DIAGNOSIS — D649 Anemia, unspecified: Secondary | ICD-10-CM | POA: Diagnosis not present

## 2015-11-04 DIAGNOSIS — I4891 Unspecified atrial fibrillation: Secondary | ICD-10-CM | POA: Diagnosis present

## 2015-11-04 DIAGNOSIS — R410 Disorientation, unspecified: Secondary | ICD-10-CM | POA: Diagnosis present

## 2015-11-04 LAB — COMPREHENSIVE METABOLIC PANEL
ALT: 7 U/L — ABNORMAL LOW (ref 14–54)
AST: 15 U/L (ref 15–41)
Albumin: 3.2 g/dL — ABNORMAL LOW (ref 3.5–5.0)
Alkaline Phosphatase: 56 U/L (ref 38–126)
Anion gap: 7 (ref 5–15)
BILIRUBIN TOTAL: 0.4 mg/dL (ref 0.3–1.2)
BUN: 27 mg/dL — AB (ref 6–20)
CHLORIDE: 107 mmol/L (ref 101–111)
CO2: 31 mmol/L (ref 22–32)
CREATININE: 1.38 mg/dL — AB (ref 0.44–1.00)
Calcium: 8.9 mg/dL (ref 8.9–10.3)
GFR, EST AFRICAN AMERICAN: 38 mL/min — AB (ref >60)
GFR, EST NON AFRICAN AMERICAN: 33 mL/min — AB (ref >60)
Glucose, Bld: 73 mg/dL (ref 65–99)
POTASSIUM: 4.5 mmol/L (ref 3.5–5.1)
Sodium: 145 mmol/L (ref 135–145)
TOTAL PROTEIN: 6.3 g/dL — AB (ref 6.5–8.1)

## 2015-11-04 LAB — CBC WITH DIFFERENTIAL/PLATELET
BASOS ABS: 0 10*3/uL (ref 0–0.1)
Basophils Relative: 1 %
EOS ABS: 0.2 10*3/uL (ref 0–0.7)
Eosinophils Relative: 5 %
HCT: 33.9 % — ABNORMAL LOW (ref 35.0–47.0)
HEMOGLOBIN: 10.8 g/dL — AB (ref 12.0–16.0)
LYMPHS ABS: 0.7 10*3/uL — AB (ref 1.0–3.6)
LYMPHS PCT: 16 %
MCH: 30.1 pg (ref 26.0–34.0)
MCHC: 31.8 g/dL — ABNORMAL LOW (ref 32.0–36.0)
MCV: 94.8 fL (ref 80.0–100.0)
Monocytes Absolute: 0.6 10*3/uL (ref 0.2–0.9)
Monocytes Relative: 12 %
NEUTROS PCT: 66 %
Neutro Abs: 3.1 10*3/uL (ref 1.4–6.5)
PLATELETS: 108 10*3/uL — AB (ref 150–440)
RBC: 3.58 MIL/uL — AB (ref 3.80–5.20)
RDW: 14.3 % (ref 11.5–14.5)
WBC: 4.7 10*3/uL (ref 3.6–11.0)

## 2015-11-08 ENCOUNTER — Emergency Department: Payer: Medicare Other

## 2015-11-08 ENCOUNTER — Emergency Department
Admission: EM | Admit: 2015-11-08 | Discharge: 2015-11-08 | Disposition: A | Discharge status: Home / Self Care | Payer: Medicare Other | Attending: Emergency Medicine | Admitting: Emergency Medicine

## 2015-11-08 ENCOUNTER — Encounter: Payer: Self-pay | Admitting: Emergency Medicine

## 2015-11-08 DIAGNOSIS — Z79899 Other long term (current) drug therapy: Secondary | ICD-10-CM | POA: Insufficient documentation

## 2015-11-08 DIAGNOSIS — R079 Chest pain, unspecified: Secondary | ICD-10-CM

## 2015-11-08 DIAGNOSIS — I10 Essential (primary) hypertension: Secondary | ICD-10-CM | POA: Diagnosis not present

## 2015-11-08 DIAGNOSIS — Z7902 Long term (current) use of antithrombotics/antiplatelets: Secondary | ICD-10-CM | POA: Diagnosis not present

## 2015-11-08 LAB — CBC WITH DIFFERENTIAL/PLATELET
BASOS PCT: 1 %
Basophils Absolute: 0 10*3/uL (ref 0–0.1)
Eosinophils Absolute: 0.2 10*3/uL (ref 0–0.7)
Eosinophils Relative: 3 %
HEMATOCRIT: 34.5 % — AB (ref 35.0–47.0)
Hemoglobin: 11.3 g/dL — ABNORMAL LOW (ref 12.0–16.0)
Lymphocytes Relative: 11 %
Lymphs Abs: 0.6 10*3/uL — ABNORMAL LOW (ref 1.0–3.6)
MCH: 30.6 pg (ref 26.0–34.0)
MCHC: 32.6 g/dL (ref 32.0–36.0)
MCV: 93.8 fL (ref 80.0–100.0)
MONO ABS: 0.4 10*3/uL (ref 0.2–0.9)
MONOS PCT: 8 %
NEUTROS ABS: 3.9 10*3/uL (ref 1.4–6.5)
Neutrophils Relative %: 77 %
Platelets: 102 10*3/uL — ABNORMAL LOW (ref 150–440)
RBC: 3.68 MIL/uL — ABNORMAL LOW (ref 3.80–5.20)
RDW: 14.5 % (ref 11.5–14.5)
WBC: 5.1 10*3/uL (ref 3.6–11.0)

## 2015-11-08 LAB — COMPREHENSIVE METABOLIC PANEL
ALBUMIN: 3.3 g/dL — AB (ref 3.5–5.0)
ALT: 16 U/L (ref 14–54)
ANION GAP: 8 (ref 5–15)
AST: 53 U/L — ABNORMAL HIGH (ref 15–41)
Alkaline Phosphatase: 85 U/L (ref 38–126)
BILIRUBIN TOTAL: 0.5 mg/dL (ref 0.3–1.2)
BUN: 34 mg/dL — ABNORMAL HIGH (ref 6–20)
CO2: 31 mmol/L (ref 22–32)
Calcium: 8.9 mg/dL (ref 8.9–10.3)
Chloride: 103 mmol/L (ref 101–111)
Creatinine, Ser: 1.7 mg/dL — ABNORMAL HIGH (ref 0.44–1.00)
GFR calc Af Amer: 30 mL/min — ABNORMAL LOW (ref >60)
GFR, EST NON AFRICAN AMERICAN: 26 mL/min — AB (ref >60)
GLUCOSE: 142 mg/dL — AB (ref 65–99)
POTASSIUM: 4.4 mmol/L (ref 3.5–5.1)
Sodium: 142 mmol/L (ref 135–145)
TOTAL PROTEIN: 6.6 g/dL (ref 6.5–8.1)

## 2015-11-08 LAB — TROPONIN I: Troponin I: <0.03 ng/mL (ref <0.031)

## 2015-11-08 LAB — FIBRIN DERIVATIVES D-DIMER (ARMC ONLY): FIBRIN DERIVATIVES D-DIMER (ARMC): 3156 — AB (ref 0–499)

## 2015-11-08 MED ORDER — TECHNETIUM TO 99M ALBUMIN AGGREGATED
4.3220 | Freq: Once | INTRAVENOUS | Status: AC | PRN
  Administered 2015-11-08: 4.322 via INTRAVENOUS

## 2015-11-08 MED ORDER — TECHNETIUM TC 99M DIETHYLENETRIAME-PENTAACETIC ACID
32.6580 | Freq: Once | INTRAVENOUS | Status: AC | PRN
  Administered 2015-11-08: 32.658 via INTRAVENOUS

## 2015-11-08 MED ORDER — SODIUM CHLORIDE 0.9 % IV BOLUS (SEPSIS)
500.0000 mL | Freq: Once | INTRAVENOUS | Status: AC
  Administered 2015-11-08: 500 mL via INTRAVENOUS

## 2015-11-08 NOTE — Discharge Instructions (Signed)
Nonspecific Chest Pain  °Chest pain can be caused by many different conditions. There is always a chance that your pain could be related to something serious, such as a heart attack or a blood clot in your lungs. Chest pain can also be caused by conditions that are not life-threatening. If you have chest pain, it is very important to follow up with your health care provider. °CAUSES  °Chest pain can be caused by: °· Heartburn. °· Pneumonia or bronchitis. °· Anxiety or stress. °· Inflammation around your heart (pericarditis) or lung (pleuritis or pleurisy). °· A blood clot in your lung. °· A collapsed lung (pneumothorax). It can develop suddenly on its own (spontaneous pneumothorax) or from trauma to the chest. °· Shingles infection (varicella-zoster virus). °· Heart attack. °· Damage to the bones, muscles, and cartilage that make up your chest wall. This can include: °¨ Bruised bones due to injury. °¨ Strained muscles or cartilage due to frequent or repeated coughing or overwork. °¨ Fracture to one or more ribs. °¨ Sore cartilage due to inflammation (costochondritis). °RISK FACTORS  °Risk factors for chest pain may include: °· Activities that increase your risk for trauma or injury to your chest. °· Respiratory infections or conditions that cause frequent coughing. °· Medical conditions or overeating that can cause heartburn. °· Heart disease or family history of heart disease. °· Conditions or health behaviors that increase your risk of developing a blood clot. °· Having had chicken pox (varicella zoster). °SIGNS AND SYMPTOMS °Chest pain can feel like: °· Burning or tingling on the surface of your chest or deep in your chest. °· Crushing, pressure, aching, or squeezing pain. °· Dull or sharp pain that is worse when you move, cough, or take a deep breath. °· Pain that is also felt in your back, neck, shoulder, or arm, or pain that spreads to any of these areas. °Your chest pain may come and go, or it may stay  constant. °DIAGNOSIS °Lab tests or other studies may be needed to find the cause of your pain. Your health care provider may have you take a test called an ambulatory ECG (electrocardiogram). An ECG records your heartbeat patterns at the time the test is performed. You may also have other tests, such as: °· Transthoracic echocardiogram (TTE). During echocardiography, sound waves are used to create a picture of all of the heart structures and to look at how blood flows through your heart. °· Transesophageal echocardiogram (TEE). This is a more advanced imaging test that obtains images from inside your body. It allows your health care provider to see your heart in finer detail. °· Cardiac monitoring. This allows your health care provider to monitor your heart rate and rhythm in real time. °· Holter monitor. This is a portable device that records your heartbeat and can help to diagnose abnormal heartbeats. It allows your health care provider to track your heart activity for several days, if needed. °· Stress tests. These can be done through exercise or by taking medicine that makes your heart beat more quickly. °· Blood tests. °· Imaging tests. °TREATMENT  °Your treatment depends on what is causing your chest pain. Treatment may include: °· Medicines. These may include: °¨ Acid blockers for heartburn. °¨ Anti-inflammatory medicine. °¨ Pain medicine for inflammatory conditions. °¨ Antibiotic medicine, if an infection is present. °¨ Medicines to dissolve blood clots. °¨ Medicines to treat coronary artery disease. °· Supportive care for conditions that do not require medicines. This may include: °¨ Resting. °¨ Applying heat   or cold packs to injured areas. °¨ Limiting activities until pain decreases. °HOME CARE INSTRUCTIONS °· If you were prescribed an antibiotic medicine, finish it all even if you start to feel better. °· Avoid any activities that bring on chest pain. °· Do not use any tobacco products, including  cigarettes, chewing tobacco, or electronic cigarettes. If you need help quitting, ask your health care provider. °· Do not drink alcohol. °· Take medicines only as directed by your health care provider. °· Keep all follow-up visits as directed by your health care provider. This is important. This includes any further testing if your chest pain does not go away. °· If heartburn is the cause for your chest pain, you may be told to keep your head raised (elevated) while sleeping. This reduces the chance that acid will go from your stomach into your esophagus. °· Make lifestyle changes as directed by your health care provider. These may include: °¨ Getting regular exercise. Ask your health care provider to suggest some activities that are safe for you. °¨ Eating a heart-healthy diet. A registered dietitian can help you to learn healthy eating options. °¨ Maintaining a healthy weight. °¨ Managing diabetes, if necessary. °¨ Reducing stress. °SEEK MEDICAL CARE IF: °· Your chest pain does not go away after treatment. °· You have a rash with blisters on your chest. °· You have a fever. °SEEK IMMEDIATE MEDICAL CARE IF:  °· Your chest pain is worse. °· You have an increasing cough, or you cough up blood. °· You have severe abdominal pain. °· You have severe weakness. °· You faint. °· You have chills. °· You have sudden, unexplained chest discomfort. °· You have sudden, unexplained discomfort in your arms, back, neck, or jaw. °· You have shortness of breath at any time. °· You suddenly start to sweat, or your skin gets clammy. °· You feel nauseous or you vomit. °· You suddenly feel light-headed or dizzy. °· Your heart begins to beat quickly, or it feels like it is skipping beats. °These symptoms may represent a serious problem that is an emergency. Do not wait to see if the symptoms will go away. Get medical help right away. Call your local emergency services (911 in the [place] Do not drive yourself to the hospital. °  °This  information is not intended to replace advice given to you by your health care provider. Make sure you discuss any questions you have with your health care provider. °  °Document Released: 08/25/2005 Document Revised: 12/06/2014 Document Reviewed: 06/21/2014 °Elsevier Interactive Patient Education ©2016 [employer] ° °

## 2015-11-08 NOTE — ED Provider Notes (Signed)
Time Seen: Approximately ----------------------------------------- 3:03 PM on 11/08/2015 -----------------------------------------    I have reviewed the triage notes  Chief Complaint: Chest Pain   History of Present Illness: Clarisa SchoolsBernadette Kolodny is a 81 y.o. female who presents via EMS with previous complaints of chest pain. She states it started approximately an hour prior to arrival. Patient does have a history of dementia and is somewhat of a unreliable historian. She currently states that she is pain-free and was transported uneventfully without any intervention. She has a history of atrial fibrillation and is currently on Eliquis. She denies any radiation of the discomfort. She denies any exacerbating or relieving factors. She denies any nausea, vomiting, shortness of breath, fever, or diaphoresis. Eyes any cough or new leg pain or swelling. She's had previous bilateral surgery. Most recently hip fracture and was admitted here in September. She is not quite started her physical therapy at this time.   Past Medical History  Diagnosis Date  . Atrial fibrillation (HCC)   . Dementia   . Hip joint replacement status   . Knee joint replacement status   . Osteoarthritis   . Hypertension   . Urinary incontinence     Patient Active Problem List   Diagnosis Date Noted  . Hip fracture (HCC) 08/13/2015    Past Surgical History  Procedure Laterality Date  . Replacement total knee Bilateral   . Knee surgery    . Hip replacment    . Total abdominal hysterectomy w/ bilateral salpingoophorectomy    . Orif femur fracture Left 08/15/2015    Procedure: OPEN REDUCTION INTERNAL FIXATION (ORIF) DISTAL FEMUR FRACTURE;  Surgeon: Deeann SaintHoward Miller, MD;  Location: [facility] ORS;  Service: Orthopedics;  Laterality: Left;  . Orif femur fracture Left 08/16/2015    Procedure: OPEN REDUCTION INTERNAL FIXATION (ORIF) DISTAL FEMUR FRACTURE;  Surgeon: Deeann SaintHoward Miller, MD;  Location: [facility] ORS;  Service: Orthopedics;   Laterality: Left;    Past Surgical History  Procedure Laterality Date  . Replacement total knee Bilateral   . Knee surgery    . Hip replacment    . Total abdominal hysterectomy w/ bilateral salpingoophorectomy    . Orif femur fracture Left 08/15/2015    Procedure: OPEN REDUCTION INTERNAL FIXATION (ORIF) DISTAL FEMUR FRACTURE;  Surgeon: Deeann SaintHoward Miller, MD;  Location: [facility] ORS;  Service: Orthopedics;  Laterality: Left;  . Orif femur fracture Left 08/16/2015    Procedure: OPEN REDUCTION INTERNAL FIXATION (ORIF) DISTAL FEMUR FRACTURE;  Surgeon: Deeann SaintHoward Miller, MD;  Location: [facility] ORS;  Service: Orthopedics;  Laterality: Left;    Current Outpatient Rx  Name  Route  Sig  Dispense  Refill  . alendronate (FOSAMAX) 70 MG tablet   Oral   Take 1 tablet (70 mg total) by mouth once a week. Take with a full glass of water on an empty stomach.         Marland Kitchen. apixaban (ELIQUIS) 2.5 MG TABS tablet   Oral   Take 2.5 mg by mouth 2 (two) times daily.         . calcium gluconate 500 MG tablet   Oral   Take 1 tablet (500 mg total) by mouth 2 (two) times daily.         . cholecalciferol (VITAMIN D) 1000 UNITS tablet   Oral   Take 1,000 Units by mouth daily.         . cyanocobalamin (,VITAMIN B-12,) 1000 MCG/ML injection   Intramuscular   Inject 1,000 mcg into the muscle every 30 (thirty)  days.         . donepezil (ARICEPT) 10 MG tablet   Oral   Take 10 mg by mouth daily.         . ferrous sulfate 325 (65 FE) MG tablet   Oral   Take 1 tablet (325 mg total) by mouth 2 (two) times daily with a meal.   60 tablet   3   . gabapentin (NEURONTIN) 300 MG capsule   Oral   Take 1 capsule (300 mg total) by mouth 3 (three) times daily.         . memantine (NAMENDA) 5 MG tablet   Oral   Take 5 mg by mouth daily.         . metoprolol succinate (TOPROL-XL) 100 MG 24 hr tablet   Oral   Take 150 mg by mouth every evening.         Marland Kitchen oxybutynin (DITROPAN) 5 MG tablet   Oral   Take 5 mg by  mouth daily at 2 PM daily at 2 PM.         . Oxycodone HCl 10 MG TABS   Oral   Take 1 tablet (10 mg total) by mouth every 6 (six) hours as needed.   60 tablet   0   . pantoprazole (PROTONIX) 40 MG tablet   Oral   Take 40 mg by mouth every evening.         . pravastatin (PRAVACHOL) 40 MG tablet   Oral   Take 40 mg by mouth every evening.           Allergies:  Keflex and Ciprofloxacin  Family History: Family History  Problem Relation Age of Onset  . CVA Sister   . Heart attack Brother     Social History: Social History  Substance Use Topics  . Smoking status: Never Smoker   . Smokeless tobacco: None  . Alcohol Use: No     Review of Systems:   10 point review of systems was performed and was otherwise negative:  Constitutional: No fever Eyes: No visual disturbances ENT: No sore throat, ear pain Cardiac: No chest pain Respiratory: No shortness of breath, wheezing, or stridor Abdomen: No abdominal pain, no vomiting, No diarrhea Endocrine: No weight loss, No night sweats Extremities: No peripheral edema, cyanosis Skin: No rashes, easy bruising Neurologic: No focal weakness, trouble with speech or swollowing Urologic: No dysuria, Hematuria, or urinary frequency   Physical Exam:  ED Triage Vitals  Enc Vitals Group     BP 11/08/15 1445 103/62 mmHg     Pulse Rate 11/08/15 1445 73     Resp 11/08/15 1445 15     Temp 11/08/15 1445 97.7 F (36.5 C)     Temp Source 11/08/15 1445 Oral     SpO2 11/08/15 1445 95 %     Weight 11/08/15 1445 150 lb 4.8 oz (68.176 kg)     Height 11/08/15 1445  (1.702 m)     Head Cir --      Peak Flow --      Pain Score 11/08/15 1447 0     Pain Loc --      Pain Edu? --      Excl. in GC? --     General: Awake , Alert , and Oriented times 3; GCS 15 Head: Normal cephalic , atraumatic Eyes: Pupils equal , round, reactive to light Nose/Throat: No nasal drainage, patent upper airway without erythema or exudate.  Neck:  Supple, Full range of motion, No anterior adenopathy or palpable thyroid masses Lungs: Clear to ascultation without wheezes , rhonchi, or rales Heart: Regular rate, irregular rhythmn without murmurs , gallops , or rubs Abdomen: Soft, non tender without rebound, guarding , or rigidity; bowel sounds positive and symmetric in all 4 quadrants. No organomegaly .        Extremities: Postoperative splints, etc. with no obvious calf tenderness or lower extremity swelling. Less than 2 second capillary refill Neurologic: normal ambulation, Motor symmetric without deficits, sensory intact Skin: warm, dry, no rashes   Labs:   All laboratory work was reviewed including any pertinent negatives or positives listed below:  Labs Reviewed  CBC WITH DIFFERENTIAL/PLATELET  COMPREHENSIVE METABOLIC PANEL  FIBRIN DERIVATIVES D-DIMER ([facility] ONLY)  TROPONIN I   initial and repeat troponin within normal limits. The D-dimer test was significantly elevated  EKG:  ED ECG REPORT I, Jennye Moccasin, the attending physician, personally viewed and interpreted this ECG.  Date: 11/08/2015 EKG Time: 1445 Rate: 101 Rhythm: normal sinus rhythm QRS Axis: normal Intervals: normal ST/T Wave abnormalities: normal Conduction Disutrbances: none Narrative Interpretation: unremarkable No acute ischemic changes   Radiology:    EXAM: NUCLEAR MEDICINE VENTILATION - PERFUSION LUNG SCAN  TECHNIQUE: Ventilation images were obtained in multiple projections using inhaled aerosol Tc-59m DTPA. Perfusion images were obtained in multiple projections after intravenous injection of Tc-28m MAA.  RADIOPHARMACEUTICALS: 32.6 Technetium-81m DTPA aerosol inhalation and 4.3 Technetium-37m MAA IV  COMPARISON: Radiographs earlier this day.  FINDINGS: Ventilation: Central deposition of radiotracer. Patchy ventilation to the left lung.  Perfusion: No wedge shaped peripheral perfusion defects to suggest acute pulmonary embolism.  There is a matched ventilation perfusion defect in the superior segment of the left lower lobe.  IMPRESSION: Low probability ventilation perfusion scan with matched defect in the left lung.  Central deposition of radiotracer on ventilation imaging suggest air trapping.   Electronically Signed By: Rubye Oaks M.D. On: 11/08/2015 21:01          DG Chest 2 View (Final result) Result time: 11/08/15 15:48:38   Final result by Rad Results In Interface (11/08/15 15:48:38)   Narrative:   CLINICAL DATA: Chest tightness  EXAM: CHEST 2 VIEW  COMPARISON: 08/13/2015  FINDINGS: Stable cardiomegaly without CHF or pneumonia. Lungs remain clear. Atherosclerosis of aorta evident. Mitral valve annular calcifications present. No edema, effusion, or pneumothorax. Trachea midline. Degenerative changes of the spine. Bones are osteopenic.  IMPRESSION: Cardiomegaly without CHF or pneumonia  Atherosclerosis        I personally reviewed the radiologic studies     ED Course:  Differential includes all life-threatening causes for chest pain. This includes but is not exclusive to acute coronary syndrome, aortic dissection, pulmonary embolism, cardiac tamponade, community-acquired pneumonia, pericarditis, musculoskeletal chest wall pain, etc. The patient remained pain-free while here in emergency department stable on both the cardiac monitor and pulse oximetry. Her cardiac workup shows consistent atrial fibrillation and initial and repeat troponins were negative and I felt we could stop the pursuit of ischemic chest discomfort at this time. Patient's D-dimer test is significantly elevated and this may be due to her recent surgery and fractures etc. however because of her fractures I felt she was at risk for pulmonary embolism even though she is currently taking Eliquis. Due to some mild renal insufficiency the patient would not be able to undergo CAT scan evaluation so a VQ scan  was ordered. The VQ scan shows low probability and given a low  clinical suspicion (patient has no current chest pain or shortness of breath, normal pulse oximetry, no pleuritic component, etc.) I felt that the patient's workup for pulmonary embolism had been completed. The family and I had discussion at the bedside and they were advised of all the testing and results and agreed with outpatient management.  Assessment:  Acute unspecified chest pain    Plan: * Outpatient management Patient was advised to return immediately if condition worsens. Patient was advised to follow up with their primary care physician or other specialized physicians involved in their outpatient care             Jennye Moccasin, MD 11/08/15 2147

## 2015-11-08 NOTE — ED Notes (Signed)
Patient returned from v q scan

## 2015-11-08 NOTE — ED Notes (Signed)
[facility]o chest tightness that started this morning. Patient denies any other associated symptoms.  Per EMS patients 12-lead EKG showed that she was in A. Fib, [facility] does not have a history of A. Fib. Vitals per EMS BP 113/55, 95% on room air, HR 96, CBG 117.

## 2015-11-08 NOTE — ED Notes (Signed)
Patient to radiology for vq scan

## 2015-11-08 NOTE — ED Notes (Signed)
MD at bedside. 

## 2015-11-16 DIAGNOSIS — D649 Anemia, unspecified: Secondary | ICD-10-CM | POA: Diagnosis not present

## 2015-11-16 LAB — URINALYSIS COMPLETE WITH MICROSCOPIC (ARMC ONLY)
BILIRUBIN URINE: NEGATIVE
Glucose, UA: NEGATIVE mg/dL
Hgb urine dipstick: NEGATIVE
KETONES UR: NEGATIVE mg/dL
LEUKOCYTES UA: NEGATIVE
NITRITE: NEGATIVE
Protein, ur: 30 mg/dL — AB
SPECIFIC GRAVITY, URINE: 1.017 (ref 1.005–1.030)
pH: 8 (ref 5.0–8.0)

## 2015-11-18 DIAGNOSIS — D649 Anemia, unspecified: Secondary | ICD-10-CM | POA: Diagnosis not present

## 2015-11-18 LAB — CBC WITH DIFFERENTIAL/PLATELET
BASOS PCT: 1 %
Basophils Absolute: 0.1 10*3/uL (ref 0–0.1)
Eosinophils Absolute: 0.2 10*3/uL (ref 0–0.7)
Eosinophils Relative: 4 %
HEMATOCRIT: 32.7 % — AB (ref 35.0–47.0)
Hemoglobin: 10.6 g/dL — ABNORMAL LOW (ref 12.0–16.0)
LYMPHS PCT: 10 %
Lymphs Abs: 0.5 10*3/uL — ABNORMAL LOW (ref 1.0–3.6)
MCH: 30.4 pg (ref 26.0–34.0)
MCHC: 32.5 g/dL (ref 32.0–36.0)
MCV: 93.6 fL (ref 80.0–100.0)
MONO ABS: 0.5 10*3/uL (ref 0.2–0.9)
MONOS PCT: 10 %
NEUTROS ABS: 3.9 10*3/uL (ref 1.4–6.5)
Neutrophils Relative %: 75 %
Platelets: 117 10*3/uL — ABNORMAL LOW (ref 150–440)
RBC: 3.5 MIL/uL — ABNORMAL LOW (ref 3.80–5.20)
RDW: 14.3 % (ref 11.5–14.5)
WBC: 5.2 10*3/uL (ref 3.6–11.0)

## 2015-11-18 LAB — COMPREHENSIVE METABOLIC PANEL
ALBUMIN: 3.3 g/dL — AB (ref 3.5–5.0)
ALK PHOS: 157 U/L — AB (ref 38–126)
ALT: 54 U/L (ref 14–54)
ANION GAP: 9 (ref 5–15)
AST: 55 U/L — AB (ref 15–41)
BILIRUBIN TOTAL: 0.7 mg/dL (ref 0.3–1.2)
BUN: 45 mg/dL — AB (ref 6–20)
CALCIUM: 8.9 mg/dL (ref 8.9–10.3)
CO2: 29 mmol/L (ref 22–32)
Chloride: 103 mmol/L (ref 101–111)
Creatinine, Ser: 2.03 mg/dL — ABNORMAL HIGH (ref 0.44–1.00)
GFR calc Af Amer: 24 mL/min — ABNORMAL LOW (ref >60)
GFR calc non Af Amer: 21 mL/min — ABNORMAL LOW (ref >60)
GLUCOSE: 126 mg/dL — AB (ref 65–99)
Potassium: 4.1 mmol/L (ref 3.5–5.1)
Sodium: 141 mmol/L (ref 135–145)
TOTAL PROTEIN: 6.5 g/dL (ref 6.5–8.1)

## 2015-11-18 LAB — URINE CULTURE

## 2015-12-01 ENCOUNTER — Encounter
Admission: RE | Admit: 2015-12-01 | Discharge: 2015-12-01 | Disposition: A | Discharge status: Home / Self Care | Payer: Medicare Other | Source: Ambulatory Visit | Attending: Internal Medicine | Admitting: Internal Medicine

## 2015-12-01 DIAGNOSIS — E876 Hypokalemia: Secondary | ICD-10-CM | POA: Insufficient documentation

## 2015-12-15 ENCOUNTER — Inpatient Hospital Stay
Admission: EM | Admit: 2015-12-15 | Discharge: 2015-12-20 | DRG: 871 | Disposition: A | Discharge status: Skilled Nursing Facility | Payer: Medicare Other | Attending: Internal Medicine | Admitting: Internal Medicine

## 2015-12-15 ENCOUNTER — Encounter: Payer: Self-pay | Admitting: Emergency Medicine

## 2015-12-15 ENCOUNTER — Emergency Department: Payer: Medicare Other

## 2015-12-15 DIAGNOSIS — Z7901 Long term (current) use of anticoagulants: Secondary | ICD-10-CM

## 2015-12-15 DIAGNOSIS — R6521 Severe sepsis with septic shock: Secondary | ICD-10-CM | POA: Diagnosis present

## 2015-12-15 DIAGNOSIS — Z90710 Acquired absence of both cervix and uterus: Secondary | ICD-10-CM

## 2015-12-15 DIAGNOSIS — N390 Urinary tract infection, site not specified: Secondary | ICD-10-CM

## 2015-12-15 DIAGNOSIS — E440 Moderate protein-calorie malnutrition: Secondary | ICD-10-CM | POA: Insufficient documentation

## 2015-12-15 DIAGNOSIS — I959 Hypotension, unspecified: Secondary | ICD-10-CM | POA: Diagnosis present

## 2015-12-15 DIAGNOSIS — Z95828 Presence of other vascular implants and grafts: Secondary | ICD-10-CM

## 2015-12-15 DIAGNOSIS — M1990 Unspecified osteoarthritis, unspecified site: Secondary | ICD-10-CM | POA: Diagnosis present

## 2015-12-15 DIAGNOSIS — M810 Age-related osteoporosis without current pathological fracture: Secondary | ICD-10-CM | POA: Diagnosis present

## 2015-12-15 DIAGNOSIS — R4182 Altered mental status, unspecified: Secondary | ICD-10-CM | POA: Diagnosis present

## 2015-12-15 DIAGNOSIS — R52 Pain, unspecified: Secondary | ICD-10-CM

## 2015-12-15 DIAGNOSIS — A4151 Sepsis due to Escherichia coli [E. coli]: Principal | ICD-10-CM | POA: Diagnosis present

## 2015-12-15 DIAGNOSIS — I10 Essential (primary) hypertension: Secondary | ICD-10-CM | POA: Diagnosis present

## 2015-12-15 DIAGNOSIS — A419 Sepsis, unspecified organism: Secondary | ICD-10-CM

## 2015-12-15 DIAGNOSIS — B961 Klebsiella pneumoniae [K. pneumoniae] as the cause of diseases classified elsewhere: Secondary | ICD-10-CM | POA: Diagnosis present

## 2015-12-15 DIAGNOSIS — Z881 Allergy status to other antibiotic agents status: Secondary | ICD-10-CM

## 2015-12-15 DIAGNOSIS — Z96642 Presence of left artificial hip joint: Secondary | ICD-10-CM | POA: Diagnosis present

## 2015-12-15 DIAGNOSIS — Z66 Do not resuscitate: Secondary | ICD-10-CM | POA: Diagnosis present

## 2015-12-15 DIAGNOSIS — Z888 Allergy status to other drugs, medicaments and biological substances status: Secondary | ICD-10-CM

## 2015-12-15 DIAGNOSIS — Z87440 Personal history of urinary (tract) infections: Secondary | ICD-10-CM | POA: Diagnosis not present

## 2015-12-15 DIAGNOSIS — Z823 Family history of stroke: Secondary | ICD-10-CM

## 2015-12-15 DIAGNOSIS — F0390 Unspecified dementia without behavioral disturbance: Secondary | ICD-10-CM | POA: Diagnosis present

## 2015-12-15 DIAGNOSIS — I4891 Unspecified atrial fibrillation: Secondary | ICD-10-CM | POA: Diagnosis present

## 2015-12-15 DIAGNOSIS — Z96659 Presence of unspecified artificial knee joint: Secondary | ICD-10-CM | POA: Diagnosis present

## 2015-12-15 DIAGNOSIS — R32 Unspecified urinary incontinence: Secondary | ICD-10-CM | POA: Diagnosis present

## 2015-12-15 DIAGNOSIS — Z96649 Presence of unspecified artificial hip joint: Secondary | ICD-10-CM | POA: Diagnosis present

## 2015-12-15 DIAGNOSIS — Z8249 Family history of ischemic heart disease and other diseases of the circulatory system: Secondary | ICD-10-CM | POA: Diagnosis not present

## 2015-12-15 DIAGNOSIS — G8929 Other chronic pain: Secondary | ICD-10-CM | POA: Diagnosis present

## 2015-12-15 DIAGNOSIS — E785 Hyperlipidemia, unspecified: Secondary | ICD-10-CM | POA: Diagnosis present

## 2015-12-15 DIAGNOSIS — R0602 Shortness of breath: Secondary | ICD-10-CM

## 2015-12-15 LAB — URINALYSIS COMPLETE WITH MICROSCOPIC (ARMC ONLY)
BILIRUBIN URINE: NEGATIVE
Glucose, UA: NEGATIVE mg/dL
KETONES UR: NEGATIVE mg/dL
NITRITE: NEGATIVE
PH: 5 (ref 5.0–8.0)
Protein, ur: 100 mg/dL — AB
SPECIFIC GRAVITY, URINE: 1.012 (ref 1.005–1.030)

## 2015-12-15 LAB — CBC WITH DIFFERENTIAL/PLATELET
Basophils Absolute: 0 10*3/uL (ref 0–0.1)
Basophils Relative: 0 %
EOS PCT: 0 %
Eosinophils Absolute: 0 10*3/uL (ref 0–0.7)
HCT: 38.5 % (ref 35.0–47.0)
Hemoglobin: 12.6 g/dL (ref 12.0–16.0)
LYMPHS ABS: 0.2 10*3/uL — AB (ref 1.0–3.6)
LYMPHS PCT: 3 %
MCH: 30.8 pg (ref 26.0–34.0)
MCHC: 32.7 g/dL (ref 32.0–36.0)
MCV: 94.1 fL (ref 80.0–100.0)
Monocytes Absolute: 0.5 10*3/uL (ref 0.2–0.9)
Monocytes Relative: 7 %
Neutro Abs: 6.9 10*3/uL — ABNORMAL HIGH (ref 1.4–6.5)
Neutrophils Relative %: 90 %
PLATELETS: 112 10*3/uL — AB (ref 150–440)
RBC: 4.09 MIL/uL (ref 3.80–5.20)
RDW: 14.5 % (ref 11.5–14.5)
WBC: 7.7 10*3/uL (ref 3.6–11.0)

## 2015-12-15 LAB — LACTIC ACID, PLASMA
Lactic Acid, Venous: 2.1 mmol/L (ref 0.5–2.0)
Lactic Acid, Venous: 1.8 mmol/L (ref 0.5–2.0)

## 2015-12-15 LAB — COMPREHENSIVE METABOLIC PANEL
ALBUMIN: 3.9 g/dL (ref 3.5–5.0)
ALT: 51 U/L (ref 14–54)
AST: 174 U/L — AB (ref 15–41)
Alkaline Phosphatase: 138 U/L — ABNORMAL HIGH (ref 38–126)
Anion gap: 11 (ref 5–15)
BUN: 40 mg/dL — AB (ref 6–20)
CHLORIDE: 103 mmol/L (ref 101–111)
CO2: 28 mmol/L (ref 22–32)
Calcium: 9.2 mg/dL (ref 8.9–10.3)
Creatinine, Ser: 1.87 mg/dL — ABNORMAL HIGH (ref 0.44–1.00)
GFR calc Af Amer: 26 mL/min — ABNORMAL LOW (ref >60)
GFR, EST NON AFRICAN AMERICAN: 23 mL/min — AB (ref >60)
GLUCOSE: 105 mg/dL — AB (ref 65–99)
POTASSIUM: 4.5 mmol/L (ref 3.5–5.1)
SODIUM: 142 mmol/L (ref 135–145)
Total Bilirubin: 1 mg/dL (ref 0.3–1.2)
Total Protein: 7.8 g/dL (ref 6.5–8.1)

## 2015-12-15 LAB — GLUCOSE, CAPILLARY: Glucose-Capillary: 113 mg/dL — ABNORMAL HIGH (ref 65–99)

## 2015-12-15 LAB — TROPONIN I: TROPONIN I: 0.03 ng/mL (ref <0.031)

## 2015-12-15 MED ORDER — CYANOCOBALAMIN 1000 MCG/ML IJ SOLN: 1000.0000 ug | INTRAMUSCULAR | Status: DC

## 2015-12-15 MED ORDER — DONEPEZIL HCL 5 MG PO TABS
10.0000 mg | ORAL_TABLET | Freq: Every day | ORAL | Status: DC
  Administered 2015-12-16 – 2015-12-19 (×4): 10 mg via ORAL
  Filled 2015-12-15 (×5): qty 2

## 2015-12-15 MED ORDER — BISACODYL 10 MG RE SUPP: 10.0000 mg | RECTAL | Status: DC | PRN

## 2015-12-15 MED ORDER — ACETAMINOPHEN 325 MG PO TABS
650.0000 mg | ORAL_TABLET | ORAL | Status: DC | PRN
  Administered 2015-12-16 – 2015-12-17 (×3): 650 mg via ORAL
  Filled 2015-12-15 (×3): qty 2

## 2015-12-15 MED ORDER — VITAMIN D 1000 UNITS PO TABS
2000.0000 [IU] | ORAL_TABLET | Freq: Every day | ORAL | Status: DC
  Administered 2015-12-16 – 2015-12-20 (×5): 2000 [IU] via ORAL
  Filled 2015-12-15 (×5): qty 2

## 2015-12-15 MED ORDER — PIPERACILLIN-TAZOBACTAM 3.375 G IVPB
3.3750 g | Freq: Two times a day (BID) | INTRAVENOUS | Status: DC
  Administered 2015-12-16: 3.375 g via INTRAVENOUS
  Filled 2015-12-15 (×2): qty 50

## 2015-12-15 MED ORDER — PRAVASTATIN SODIUM 20 MG PO TABS
40.0000 mg | ORAL_TABLET | Freq: Every day | ORAL | Status: DC
  Administered 2015-12-15 – 2015-12-19 (×5): 40 mg via ORAL
  Filled 2015-12-15 (×5): qty 2

## 2015-12-15 MED ORDER — SODIUM CHLORIDE 0.9 % IV BOLUS (SEPSIS)
1000.0000 mL | Freq: Once | INTRAVENOUS | Status: AC
  Administered 2015-12-15: 1000 mL via INTRAVENOUS

## 2015-12-15 MED ORDER — OXYCODONE HCL 5 MG PO TABS
5.0000 mg | ORAL_TABLET | Freq: Four times a day (QID) | ORAL | Status: DC | PRN
  Administered 2015-12-16 – 2015-12-19 (×2): 5 mg via ORAL
  Filled 2015-12-15 (×2): qty 1

## 2015-12-15 MED ORDER — DEXTROSE 5 % IV SOLN: 1.0000 g | INTRAVENOUS | Status: DC

## 2015-12-15 MED ORDER — CALCIUM GLUCONATE 500 MG PO TABS: 1.0000 | ORAL_TABLET | Freq: Two times a day (BID) | ORAL | Status: DC

## 2015-12-15 MED ORDER — GABAPENTIN 300 MG PO CAPS
300.0000 mg | ORAL_CAPSULE | Freq: Two times a day (BID) | ORAL | Status: DC
  Administered 2015-12-15 – 2015-12-20 (×10): 300 mg via ORAL
  Filled 2015-12-15 (×10): qty 1

## 2015-12-15 MED ORDER — VANCOMYCIN HCL IN DEXTROSE 1-5 GM/200ML-% IV SOLN
1000.0000 mg | INTRAVENOUS | Status: DC
  Filled 2015-12-15: qty 200

## 2015-12-15 MED ORDER — PIPERACILLIN-TAZOBACTAM 3.375 G IVPB 30 MIN
3.3750 g | Freq: Once | INTRAVENOUS | Status: AC
  Administered 2015-12-15: 3.375 g via INTRAVENOUS
  Filled 2015-12-15: qty 50

## 2015-12-15 MED ORDER — CALCIUM CARBONATE ANTACID 500 MG PO CHEW
1.0000 | CHEWABLE_TABLET | Freq: Two times a day (BID) | ORAL | Status: DC
  Administered 2015-12-16 – 2015-12-20 (×7): 200 mg via ORAL
  Filled 2015-12-15 (×9): qty 1

## 2015-12-15 MED ORDER — ALUM & MAG HYDROXIDE-SIMETH 200-200-20 MG/5ML PO SUSP: 30.0000 mL | Freq: Four times a day (QID) | ORAL | Status: DC | PRN

## 2015-12-15 MED ORDER — FERROUS SULFATE 325 (65 FE) MG PO TABS
325.0000 mg | ORAL_TABLET | Freq: Two times a day (BID) | ORAL | Status: DC
  Administered 2015-12-16 – 2015-12-20 (×9): 325 mg via ORAL
  Filled 2015-12-15 (×9): qty 1

## 2015-12-15 MED ORDER — ACETAMINOPHEN 325 MG PO TABS
650.0000 mg | ORAL_TABLET | Freq: Once | ORAL | Status: AC
  Administered 2015-12-15: 650 mg via ORAL
  Filled 2015-12-15: qty 2

## 2015-12-15 MED ORDER — VANCOMYCIN HCL IN DEXTROSE 1-5 GM/200ML-% IV SOLN
1000.0000 mg | Freq: Once | INTRAVENOUS | Status: AC
Start: 2015-12-15 — End: 2015-12-15
  Administered 2015-12-15: 1000 mg via INTRAVENOUS
  Filled 2015-12-15: qty 200

## 2015-12-15 MED ORDER — SODIUM CHLORIDE 0.9 % IV BOLUS (SEPSIS)
500.0000 mL | Freq: Once | INTRAVENOUS | Status: AC
  Administered 2015-12-15: 500 mL via INTRAVENOUS

## 2015-12-15 MED ORDER — SODIUM CHLORIDE 0.9 % IV SOLN
INTRAVENOUS | Status: DC
  Administered 2015-12-15 – 2015-12-19 (×6): via INTRAVENOUS

## 2015-12-15 MED ORDER — PANTOPRAZOLE SODIUM 40 MG PO TBEC
40.0000 mg | DELAYED_RELEASE_TABLET | Freq: Every day | ORAL | Status: DC
  Administered 2015-12-16 – 2015-12-20 (×5): 40 mg via ORAL
  Filled 2015-12-15 (×5): qty 1

## 2015-12-15 MED ORDER — SODIUM CHLORIDE 0.9 % IV BOLUS (SEPSIS)
500.0000 mL | INTRAVENOUS | Status: AC
  Administered 2015-12-15: 500 mL via INTRAVENOUS

## 2015-12-15 MED ORDER — MIRTAZAPINE 15 MG PO TABS
7.5000 mg | ORAL_TABLET | Freq: Every day | ORAL | Status: DC
  Administered 2015-12-15 – 2015-12-19 (×5): 7.5 mg via ORAL
  Filled 2015-12-15: qty 2
  Filled 2015-12-15 (×4): qty 1

## 2015-12-15 MED ORDER — OXYBUTYNIN CHLORIDE 5 MG PO TABS
5.0000 mg | ORAL_TABLET | Freq: Every day | ORAL | Status: DC
  Administered 2015-12-16 – 2015-12-20 (×5): 5 mg via ORAL
  Filled 2015-12-15 (×5): qty 1

## 2015-12-15 MED ORDER — SODIUM CHLORIDE 0.9 % IV BOLUS (SEPSIS)
1000.0000 mL | INTRAVENOUS | Status: AC
  Administered 2015-12-15 (×2): 1000 mL via INTRAVENOUS

## 2015-12-15 MED ORDER — CETYLPYRIDINIUM CHLORIDE 0.05 % MT LIQD
7.0000 mL | Freq: Two times a day (BID) | OROMUCOSAL | Status: DC
  Administered 2015-12-15 – 2015-12-20 (×8): 7 mL via OROMUCOSAL

## 2015-12-15 MED ORDER — ALENDRONATE SODIUM 70 MG PO TABS: 70.0000 mg | ORAL_TABLET | ORAL | Status: DC

## 2015-12-15 MED ORDER — APIXABAN 2.5 MG PO TABS
2.5000 mg | ORAL_TABLET | Freq: Two times a day (BID) | ORAL | Status: DC
  Administered 2015-12-15 – 2015-12-20 (×10): 2.5 mg via ORAL
  Filled 2015-12-15 (×5): qty 1
  Filled 2015-12-15: qty 2
  Filled 2015-12-15 (×4): qty 1

## 2015-12-15 NOTE — Progress Notes (Signed)
ANTIBIOTIC CONSULT NOTE - INITIAL  Pharmacy Consult for Zosyn and vancomycin Indication: UTI /sepsis  Allergies  Allergen Reactions  . Keflex [Cephalexin] Other (See Comments)    Reaction:  Tremors   . Ciprofloxacin Rash and Swelling    Patient Measurements: Height: 5\' 2"  (157.5 cm) Weight: 150 lb (68.04 kg) IBW/kg (Calculated) : 50.1 Adjusted Body Weight:   Vital Signs: Temp: 103.3 F (39.6 C) (01/16 2130) Temp Source: Rectal (01/16 2130) BP: 68/39 mmHg (01/16 2135) Pulse Rate: 115 (01/16 2135) Intake/Output from previous day:   Intake/Output from this shift:    Labs:  Recent Labs  12/15/15 1657  WBC 7.7  HGB 12.6  PLT 112*  CREATININE 1.87*   Estimated Creatinine Clearance: 18.4 mL/min (by C-G formula based on Cr of 1.87). No results for input(s): VANCOTROUGH, VANCOPEAK, VANCORANDOM, GENTTROUGH, GENTPEAK, GENTRANDOM, TOBRATROUGH, TOBRAPEAK, TOBRARND, AMIKACINPEAK, AMIKACINTROU, AMIKACIN in the last 72 hours.   Microbiology: Recent Results (from the past 720 hour(s))  Urine culture     Status: None   Collection Time: 11/16/15 12:44 PM  Result Value Ref Range Status   Specimen Description URINE, RANDOM  Final   Special Requests NONE  Final   Culture   Final    >=100,000 COLONIES/mL ESCHERICHIA COLI >=100,000 COLONIES/mL ENTEROCOCCUS FAECALIS    Report Status 11/18/2015 FINAL  Final   Organism ID, Bacteria ESCHERICHIA COLI  Final   Organism ID, Bacteria ENTEROCOCCUS FAECALIS  Final      Susceptibility   Escherichia coli - MIC*    AMPICILLIN 4 SENSITIVE Sensitive     CEFTAZIDIME <=1 SENSITIVE Sensitive     CEFAZOLIN <=4 SENSITIVE Sensitive     CEFTRIAXONE <=1 SENSITIVE Sensitive     CIPROFLOXACIN <=0.25 SENSITIVE Sensitive     GENTAMICIN <=1 SENSITIVE Sensitive     IMIPENEM <=0.25 SENSITIVE Sensitive     TRIMETH/SULFA <=20 SENSITIVE Sensitive     PIP/TAZO Value in next row Sensitive      SENSITIVE<=4    * >=100,000 COLONIES/mL ESCHERICHIA COLI   Enterococcus faecalis - MIC*    AMPICILLIN Value in next row Sensitive      SENSITIVE<=4    LINEZOLID Value in next row Sensitive      SENSITIVE<=4    TETRACYCLINE Value in next row Sensitive      SENSITIVE<=1    CIPROFLOXACIN Value in next row Resistant      RESISTANT>=8    LEVOFLOXACIN Value in next row Resistant      RESISTANT>=8    NITROFURANTOIN Value in next row Sensitive      SENSITIVE<=16    * >=100,000 COLONIES/mL ENTEROCOCCUS FAECALIS    Medical History: Past Medical History  Diagnosis Date  . Atrial fibrillation (HCC)   . Dementia   . Hip joint replacement status   . Knee joint replacement status   . Osteoarthritis   . Hypertension   . Urinary incontinence     Medications:   (Not in a hospital admission) Assessment: CrCl = 18.4 ml/min   Goal of Therapy:  Resolution of infection   Plan:  Vancomycin per pharmacy consult added for sepsis. Vd 39.9 L, Ke 0.02 hr-1, T1/2 35.3 hr. Start vancomycin 1 gm IV Q48H with stacked dosing, second dose approximately 27 hours after first, predicted trough 16 mcg/mL. Pharmacy will continue to follow and adjust as needed to maintain trough 15 to 20 mcg/mL.  Carola FrostNathan A Alianny Toelle, Pharm.D., BCPS Clinical Pharmacist 12/15/2015,10:06 PM

## 2015-12-15 NOTE — ED Notes (Signed)
[facility].  Pt states started feeling bad yesterday, per EMS pt had AMS this morning worsening throughout day.  Pt states recently treated for UTI and still taking antibiotics.  EMS reported 100.5 temp, 92% RA, hx A.fib.  Pt presents oriented to person and place, disoriented to time.

## 2015-12-15 NOTE — Progress Notes (Signed)
Patient blood pressure started dropping again in 6 systolic and 60s and 70s with heart rate ranging in 130. I examined the patient, she remains in confusion.  Assessment and plan  * Sepsis now hypotension again.  We will give normal saline 500 mL bolus  Monitor her in stepdown unit.  I called and spoke to patient's niece Maralyn SagoSarah, she gave me the number of patient's nephew who lives in [place] and her power of attorney- Kristeen MissMarley, William: [phone].  I spoke to him and explained about patient's condition and critical situation. And possible need off the pressors or intubation if she continues to remain a full code.  He understands the severity of situation, and he agrees to make her DO NOT RESUSCITATE. I changed patient's CODE STATUS in the chart.  Additional critical care time spent 28 minutes.

## 2015-12-15 NOTE — Progress Notes (Signed)
ANTIBIOTIC CONSULT NOTE - INITIAL  Pharmacy Consult for Zosyn  Indication: UTI   Allergies  Allergen Reactions  . Keflex [Cephalexin] Other (See Comments)    Reaction:  Tremors   . Ciprofloxacin Rash and Swelling    Patient Measurements: Height: 5\' 2"  (157.5 cm) Weight: 150 lb (68.04 kg) IBW/kg (Calculated) : 50.1 Adjusted Body Weight:   Vital Signs: Temp: 104.2 F (40.1 C) (01/16 1703) Temp Source: Rectal (01/16 1703) BP: 123/97 mmHg (01/16 2030) Pulse Rate: 71 (01/16 2001) Intake/Output from previous day:   Intake/Output from this shift:    Labs:  Recent Labs  12/15/15 1657  WBC 7.7  HGB 12.6  PLT 112*  CREATININE 1.87*   Estimated Creatinine Clearance: 18.4 mL/min (by C-G formula based on Cr of 1.87). No results for input(s): VANCOTROUGH, VANCOPEAK, VANCORANDOM, GENTTROUGH, GENTPEAK, GENTRANDOM, TOBRATROUGH, TOBRAPEAK, TOBRARND, AMIKACINPEAK, AMIKACINTROU, AMIKACIN in the last 72 hours.   Microbiology: Recent Results (from the past 720 hour(s))  Urine culture     Status: None   Collection Time: 11/16/15 12:44 PM  Result Value Ref Range Status   Specimen Description URINE, RANDOM  Final   Special Requests NONE  Final   Culture   Final    >=100,000 COLONIES/mL ESCHERICHIA COLI >=100,000 COLONIES/mL ENTEROCOCCUS FAECALIS    Report Status 11/18/2015 FINAL  Final   Organism ID, Bacteria ESCHERICHIA COLI  Final   Organism ID, Bacteria ENTEROCOCCUS FAECALIS  Final      Susceptibility   Escherichia coli - MIC*    AMPICILLIN 4 SENSITIVE Sensitive     CEFTAZIDIME <=1 SENSITIVE Sensitive     CEFAZOLIN <=4 SENSITIVE Sensitive     CEFTRIAXONE <=1 SENSITIVE Sensitive     CIPROFLOXACIN <=0.25 SENSITIVE Sensitive     GENTAMICIN <=1 SENSITIVE Sensitive     IMIPENEM <=0.25 SENSITIVE Sensitive     TRIMETH/SULFA <=20 SENSITIVE Sensitive     PIP/TAZO Value in next row Sensitive      SENSITIVE<=4    * >=100,000 COLONIES/mL ESCHERICHIA COLI   Enterococcus faecalis -  MIC*    AMPICILLIN Value in next row Sensitive      SENSITIVE<=4    LINEZOLID Value in next row Sensitive      SENSITIVE<=4    TETRACYCLINE Value in next row Sensitive      SENSITIVE<=1    CIPROFLOXACIN Value in next row Resistant      RESISTANT>=8    LEVOFLOXACIN Value in next row Resistant      RESISTANT>=8    NITROFURANTOIN Value in next row Sensitive      SENSITIVE<=16    * >=100,000 COLONIES/mL ENTEROCOCCUS FAECALIS    Medical History: Past Medical History  Diagnosis Date  . Atrial fibrillation (HCC)   . Dementia   . Hip joint replacement status   . Knee joint replacement status   . Osteoarthritis   . Hypertension   . Urinary incontinence     Medications:   (Not in a hospital admission) Assessment: CrCl = 18.4 ml/min   Goal of Therapy:  Resolution of infection   Plan:  Expected duration 7 days with resolution of temperature and/or normalization of WBC   Zosyn 3.375 gm IV X 1 given on 1/16 @ 18:00. Zosyn 3.375 gm IV Q12H EI ordered to start 1/17 @ 6:00.   Lyle Niblett D 12/15/2015,8:58 PM

## 2015-12-15 NOTE — H&P (Signed)
[facility] - [place] at [facility]   PATIENT NAME: Jill House    MR#:  [id number]  DATE OF BIRTH:  1924/04/25  DATE OF ADMISSION:  12/15/2015  PRIMARY CARE PHYSICIAN: Margaretann Loveless, MD   REQUESTING/REFERRING PHYSICIAN: Dr. Weston Settle.  CHIEF COMPLAINT:   Chief Complaint  Patient presents with  . Altered Mental Status  . Code Sepsis    HISTORY OF PRESENT ILLNESS: Jill House  is a 82 y.o. female with a known history of atrial fibrillation, dementia, hip joint replacement multiple times, knee joint replacement, osteoporosis, osteoarthritis, hypertension, urinary incontinence, fracture of the TPR in September 2016 and after surgery sent to the rehabilitation, since then she could not recover much in physical activities and still living in rehabilitation Center. Her local caregiver is her nephew who was out of town for last few days today he received a call from the nursing home that she is not doing good having fever and generalized weakness and they are transferring her to hospital so he came directly to emergency room. In ER she was noted to have fever up to 104, blood pressure was running low and she has tachycardia with atrial fibrillation heart rate running up to 130-140. Her blood pressure is stable after receiving IV fluids but heart was still continued to run faster. Urinalysis is positive for infection. Patient has slight confusion on top of her baseline dementia and so all this history is obtained from her nephew who is present in the room.  PAST MEDICAL HISTORY:   Past Medical History  Diagnosis Date  . Atrial fibrillation (HCC)   . Dementia   . Hip joint replacement status   . Knee joint replacement status   . Osteoarthritis   . Hypertension   . Urinary incontinence     PAST SURGICAL HISTORY: Past Surgical History  Procedure Laterality Date  . Replacement total knee Bilateral   . Knee surgery    . Hip replacment    . Total  abdominal hysterectomy w/ bilateral salpingoophorectomy    . Orif femur fracture Left 08/15/2015    Procedure: OPEN REDUCTION INTERNAL FIXATION (ORIF) DISTAL FEMUR FRACTURE;  Surgeon: Deeann Saint, MD;  Location: [facility] ORS;  Service: Orthopedics;  Laterality: Left;  . Orif femur fracture Left 08/16/2015    Procedure: OPEN REDUCTION INTERNAL FIXATION (ORIF) DISTAL FEMUR FRACTURE;  Surgeon: Deeann Saint, MD;  Location: [facility] ORS;  Service: Orthopedics;  Laterality: Left;    SOCIAL HISTORY:  Social History  Substance Use Topics  . Smoking status: Never Smoker   . Smokeless tobacco: Not on file  . Alcohol Use: No    FAMILY HISTORY:  Family History  Problem Relation Age of Onset  . CVA Sister   . Heart attack Brother     DRUG ALLERGIES:  Allergies  Allergen Reactions  . Keflex [Cephalexin] Other (See Comments)    Reaction:  Tremors   . Ciprofloxacin Rash and Swelling    REVIEW OF SYSTEMS:   Dementia and confusion due to infection.  MEDICATIONS AT HOME:  Prior to Admission medications   Medication Sig Start Date End Date Taking? Authorizing Provider  acetaminophen (TYLENOL) 325 MG tablet Take 650 mg by mouth every 4 (four) hours as needed for mild pain, fever or headache.   Yes Historical Provider, MD  alendronate (FOSAMAX) 70 MG tablet Take 70 mg by mouth once a week. Pt takes on Sunday.   Take with a full glass of water on an empty stomach.  Yes Historical Provider, MD  alum & mag hydroxide-simeth (MAALOX PLUS) 400-400-40 MG/5ML suspension Take 30 mLs by mouth every 6 (six) hours as needed for indigestion (reflux/gas).   Yes Historical Provider, MD  apixaban (ELIQUIS) 2.5 MG TABS tablet Take 2.5 mg by mouth every 12 (twelve) hours.    Yes Historical Provider, MD  bisacodyl (DULCOLAX) 10 MG suppository Place 10 mg rectally as needed for moderate constipation.   Yes Historical Provider, MD  calcium carbonate (TUMS - DOSED IN MG ELEMENTAL CALCIUM) 500 MG chewable tablet Chew 1  tablet by mouth 2 (two) times daily.   Yes Historical Provider, MD  cholecalciferol (VITAMIN D) 1000 UNITS tablet Take 2,000 Units by mouth daily.    Yes Historical Provider, MD  cyanocobalamin (,VITAMIN B-12,) 1000 MCG/ML injection Inject 1,000 mcg into the muscle every 30 (thirty) days. Pt uses on the first of every month.   Yes Historical Provider, MD  donepezil (ARICEPT) 10 MG tablet Take 10 mg by mouth daily.   Yes Historical Provider, MD  ferrous sulfate 325 (65 FE) MG tablet Take 1 tablet (325 mg total) by mouth 2 (two) times daily with a meal. 08/19/15  Yes Alford Highlandichard Wieting, MD  gabapentin (NEURONTIN) 300 MG capsule Take 1 capsule (300 mg total) by mouth 3 (three) times daily. 08/19/15  Yes Alford Highlandichard Wieting, MD  metoprolol succinate (TOPROL-XL) 100 MG 24 hr tablet Take 150 mg by mouth daily.    Yes Historical Provider, MD  mirtazapine (REMERON) 7.5 MG tablet Take 7.5 mg by mouth at bedtime.   Yes Historical Provider, MD  oxybutynin (DITROPAN) 5 MG tablet Take 5 mg by mouth daily.    Yes Historical Provider, MD  oxyCODONE (OXY IR/ROXICODONE) 5 MG immediate release tablet Take 5 mg by mouth every 12 (twelve) hours. Pt is also able to use every six hours as needed for pain.   Yes Historical Provider, MD  pantoprazole (PROTONIX) 40 MG tablet Take 40 mg by mouth daily.    Yes Historical Provider, MD  potassium chloride SA (K-DUR,KLOR-CON) 20 MEQ tablet Take 20 mEq by mouth daily.   Yes Historical Provider, MD  pravastatin (PRAVACHOL) 40 MG tablet Take 40 mg by mouth at bedtime.    Yes Historical Provider, MD  torsemide (DEMADEX) 10 MG tablet Take 10 mg by mouth daily.   Yes Historical Provider, MD  calcium gluconate 500 MG tablet Take 1 tablet (500 mg total) by mouth 2 (two) times daily. Patient not taking: Reported on 12/15/2015 08/19/15   Alford Highlandichard Wieting, MD  Oxycodone HCl 10 MG TABS Take 1 tablet (10 mg total) by mouth every 6 (six) hours as needed. Patient not taking: Reported on 12/15/2015 08/19/15    Alford Highlandichard Wieting, MD      PHYSICAL EXAMINATION:   VITAL SIGNS: Blood pressure 96/70, pulse 105, temperature 104.2 F (40.1 C), temperature source Rectal, resp. rate 29, height 5\' 2"  (1.575 m), weight 68.04 kg (150 lb), SpO2 95 %.  GENERAL:  82 y.o.-year-old patient lying in the bed with no acute distress.  EYES: Pupils equal, round, reactive to light and accommodation. No scleral icterus. Extraocular muscles intact.  HEENT: Head atraumatic, normocephalic. Oropharynx and nasopharynx clear.  NECK:  Supple, no jugular venous distention. No thyroid enlargement, no tenderness.  LUNGS: Normal breath sounds bilaterally, no wheezing, rales,rhonchi or crepitation. No use of accessory muscles of respiration.  CARDIOVASCULAR: S1, S2 fast and irregular. No murmurs, rubs, or gallops.  ABDOMEN: Soft, nontender, nondistended. Bowel sounds present. No organomegaly or  mass.  EXTREMITIES: No pedal edema, cyanosis, or clubbing.  NEUROLOGIC: Cranial nerves II through XII are intact. Muscle strength 4/5 in upper extremities, 3/5 in lower extremities. Sensation intact. Gait not checked. Generalized weakness, PSYCHIATRIC: The patient is alert and oriented to self.  SKIN: No obvious rash, lesion, or ulcer.   LABORATORY PANEL:   CBC  Recent Labs Lab 12/15/15 1657  WBC 7.7  HGB 12.6  HCT 38.5  PLT 112*  MCV 94.1  MCH 30.8  MCHC 32.7  RDW 14.5  LYMPHSABS 0.2*  MONOABS 0.5  EOSABS 0.0  BASOSABS 0.0   ------------------------------------------------------------------------------------------------------------------  Chemistries   Recent Labs Lab 12/15/15 1657  NA 142  K 4.5  CL 103  CO2 28  GLUCOSE 105*  BUN 40*  CREATININE 1.87*  CALCIUM 9.2  AST 174*  ALT 51  ALKPHOS 138*  BILITOT 1.0   ------------------------------------------------------------------------------------------------------------------ estimated creatinine clearance is 18.4 mL/min (by C-G formula based on Cr of  1.87). ------------------------------------------------------------------------------------------------------------------ No results for input(s): TSH, T4TOTAL, T3FREE, THYROIDAB in the last 72 hours.  Invalid input(s): FREET3   Coagulation profile No results for input(s): INR, PROTIME in the last 168 hours. ------------------------------------------------------------------------------------------------------------------- No results for input(s): DDIMER in the last 72 hours. -------------------------------------------------------------------------------------------------------------------  Cardiac Enzymes  Recent Labs Lab 12/15/15 1657  TROPONINI 0.03   ------------------------------------------------------------------------------------------------------------------ Invalid input(s): POCBNP  ---------------------------------------------------------------------------------------------------------------  Urinalysis    Component Value Date/Time   COLORURINE YELLOW* 12/15/2015 1657   COLORURINE Yellow 11/30/2014 2208   APPEARANCEUR TURBID* 12/15/2015 1657   APPEARANCEUR Clear 11/30/2014 2208   LABSPEC 1.012 12/15/2015 1657   LABSPEC 1.019 11/30/2014 2208   PHURINE 5.0 12/15/2015 1657   PHURINE 6.0 11/30/2014 2208   GLUCOSEU NEGATIVE 12/15/2015 1657   GLUCOSEU Negative 11/30/2014 2208   HGBUR 3+* 12/15/2015 1657   HGBUR 1+ 11/30/2014 2208   BILIRUBINUR NEGATIVE 12/15/2015 1657   BILIRUBINUR Negative 11/30/2014 2208   KETONESUR NEGATIVE 12/15/2015 1657   KETONESUR Negative 11/30/2014 2208   PROTEINUR 100* 12/15/2015 1657   PROTEINUR Negative 11/30/2014 2208   NITRITE NEGATIVE 12/15/2015 1657   NITRITE Negative 11/30/2014 2208   LEUKOCYTESUR 3+* 12/15/2015 1657   LEUKOCYTESUR Negative 11/30/2014 2208     RADIOLOGY: Dg Chest Port 1 View  12/15/2015  CLINICAL DATA:  82 year old female with complaint of altered mental status. Febrile. 92% oxygen saturation on room air.  Recently treated for urinary tract infection, still on antibiotics. EXAM: PORTABLE CHEST 1 VIEW COMPARISON:  Chest x-ray 11/08/2015. FINDINGS: Mild diffuse peribronchial cuffing. Lung volumes are normal. No consolidative airspace disease. No pleural effusions. No evidence of pulmonary edema. Heart size is borderline enlarged. The patient is rotated to the left on today's exam, resulting in distortion of the mediastinal contours and reduced diagnostic sensitivity and specificity for mediastinal pathology. Atherosclerosis in the thoracic aorta. Surgical clips at the level of the thoracic inlet, potentially from prior thyroidectomy. IMPRESSION: 1. Mild diffuse peribronchial cuffing, suggesting an acute bronchitis. 2. Atherosclerosis. 3. Borderline cardiomegaly. Electronically Signed   By: Trudie Reed M.D.   On: 12/15/2015 18:06    EKG: Orders placed or performed during the hospital encounter of 12/15/15  . ED EKG 12-Lead  . ED EKG 12-Lead    IMPRESSION AND PLAN:  * Sepsis due to UTI  Associated with the tachycardia, hypotension, altered mental status, fever  We will give Zosyn IV for now,ED give 1 dose of Zosyn. Patient is allergic to Cipro and cephalosporin,  Urine culture is sent, wait for the report to adjust antibiotics.  IV fluids to help with the sepsis.  Because of borderline blood pressure and still running tachycardia will admit to telemetry unit and monitored over there overnight.  * Atrial fibrillation with rapid ventricular response  There is a component of fast heart rate secondary to UTI also.  Could not give her any rate controlling medications at this time because of blood pressure running low.  I'm giving IV fluid and hopefully once the blood pressure is stable her heart rate will slow down a little bit.  She was taking metoprolol at home currently I will hold it because of hypotension.  She is on Eliquis for any coagulation, I think we should stop that at the time of  discharge as her functional status is very poor and she might be high risk for falls at nursing home.  * Chronic pain  She is on oxycodone and gabapentin at nursing home with very high doses  I will decrease the dose but continue with small dose that she is dependent on them.  Currently running hypotensive so we'll have to keep a close watch on the pain medications and her blood pressure.  *Hyperlipidemia  Continue statin.  * Urinary incontinence  Continue oxybutynin.    All the records are reviewed and case discussed with ED provider. Management plans discussed with the patient, family and they are in agreement.  CODE STATUS: Full code  Patient was DO NOT RESUSCITATE in past but as per her nephew who is present in the room when she was admitted last time to the hospital in September 2016 ,somehow she got converted to full code "as per her wishes". She is confused now so I cannot address that issue.  Her power of attorney is her other nephew who lives in [place]. I encouraged her nephew who is local and is a retired respiratory therapist to encourage discussion amongst the family members about her CODE STATUS and try to convert her back to DO NOT RESUSCITATE because of her overall poor health status and very poor long-term prognosis.  Code Status History    Date Active Date Inactive Code Status Order ID Comments User Context   08/16/2015  6:13 PM 08/19/2015  3:21 PM DNR [id number]  Deeann Saint, MD Inpatient   08/13/2015  3:31 PM 08/16/2015  6:13 PM DNR [id number]  Houston Siren, MD ED    Questions for Most Recent Historical Code Status (Order [id number])    Question Answer Comment   In the event of cardiac or respiratory ARREST Do not call a "code blue"    In the event of cardiac or respiratory ARREST Do not perform Intubation, CPR, defibrillation or ACLS    In the event of cardiac or respiratory ARREST Use medication by any route, position, wound care, and other measures to relive  pain and suffering. May use oxygen, suction and manual treatment of airway obstruction as needed for comfort.        TOTAL TIME TAKING CARE OF THIS PATIENT: 50 critical care minutes.  Condition is critical because of sepsis, hypotension, tachycardia.  Altamese Dilling M.D on 12/15/2015   Between 7am to 6pm - Pager - [phone]  After 6pm go to [email or web address] - password [email or web address]  [facility]  Office  [phone]  CC: Primary care physician; Margaretann Loveless, MD   Note: This dictation was prepared with Dragon dictation along with smaller phrase technology. Any transcriptional errors that result from this process are unintentional.

## 2015-12-15 NOTE — Progress Notes (Signed)
eLink Physician-Brief Progress Note Patient Name: Jill House DOB: July 26, 1924 MRN: [id number]   Date of Service  12/15/2015  HPI/Events of Note  Uti, septic shock, AF-RVR  eICU Interventions  Lactate cleared DNR noted Sepsis repeat assessment completed. Use more fluids rather than pressors     Intervention Category Evaluation Type: New Patient Evaluation  ALVA,RAKESH V. 12/15/2015, 11:00 PM

## 2015-12-15 NOTE — ED Provider Notes (Signed)
[facility] Emergency Department Provider Note  Time seen: 6:09 PM  I have reviewed the triage vital signs and the nursing notes.   HISTORY  Chief Complaint Altered Mental Status and Code Sepsis    HPI Jill House is a 82 y.o. female with a past medical history of atrial fibrillation, dementia, hypertension who presents to the emergency department with a fever. According to the nephew the patient has a history of dementia, is recent treated for urinary tract infection but is not clear on the dates. Per nursing home report patient was recently placed on Bactrim for UTI. Patient has dementia and cannot give an accurate history. Per the nephew the patient is acting close to her baseline. On arrival the patient is febrile 104.2, tachycardic of 150 bpm and atrial fibrillation. Patient denies any chest pain, abdominal pain, shortness breath, cough, dysuria, however the patient's history is greatly limited by her history of dementia.   Past Medical History  Diagnosis Date  . Atrial fibrillation (HCC)   . Dementia   . Hip joint replacement status   . Knee joint replacement status   . Osteoarthritis   . Hypertension   . Urinary incontinence     Patient Active Problem List   Diagnosis Date Noted  . Hip fracture (HCC) 08/13/2015    Past Surgical History  Procedure Laterality Date  . Replacement total knee Bilateral   . Knee surgery    . Hip replacment    . Total abdominal hysterectomy w/ bilateral salpingoophorectomy    . Orif femur fracture Left 08/15/2015    Procedure: OPEN REDUCTION INTERNAL FIXATION (ORIF) DISTAL FEMUR FRACTURE;  Surgeon: Deeann SaintHoward Miller, MD;  Location: [facility] ORS;  Service: Orthopedics;  Laterality: Left;  . Orif femur fracture Left 08/16/2015    Procedure: OPEN REDUCTION INTERNAL FIXATION (ORIF) DISTAL FEMUR FRACTURE;  Surgeon: Deeann SaintHoward Miller, MD;  Location: [facility] ORS;  Service: Orthopedics;  Laterality: Left;    Current Outpatient Rx   Name  Route  Sig  Dispense  Refill  . acetaminophen (TYLENOL) 325 MG tablet   Oral   Take 650 mg by mouth every 4 (four) hours as needed for mild pain, fever or headache.         . alendronate (FOSAMAX) 70 MG tablet   Oral   Take 70 mg by mouth once a week. Pt takes on Sunday.   Take with a full glass of water on an empty stomach.         Marland Kitchen. alum & mag hydroxide-simeth (MAALOX PLUS) 400-400-40 MG/5ML suspension   Oral   Take 30 mLs by mouth every 6 (six) hours as needed for indigestion (reflux/gas).         Marland Kitchen. apixaban (ELIQUIS) 2.5 MG TABS tablet   Oral   Take 2.5 mg by mouth every 12 (twelve) hours.          . bisacodyl (DULCOLAX) 10 MG suppository   Rectal   Place 10 mg rectally as needed for moderate constipation.         . calcium carbonate (TUMS - DOSED IN MG ELEMENTAL CALCIUM) 500 MG chewable tablet   Oral   Chew 1 tablet by mouth 2 (two) times daily.         . cholecalciferol (VITAMIN D) 1000 UNITS tablet   Oral   Take 2,000 Units by mouth daily.          . cyanocobalamin (,VITAMIN B-12,) 1000 MCG/ML injection   Intramuscular   Inject  1,000 mcg into the muscle every 30 (thirty) days. Pt uses on the first of every month.         . donepezil (ARICEPT) 10 MG tablet   Oral   Take 10 mg by mouth daily.         . ferrous sulfate 325 (65 FE) MG tablet   Oral   Take 1 tablet (325 mg total) by mouth 2 (two) times daily with a meal.   60 tablet   3   . gabapentin (NEURONTIN) 300 MG capsule   Oral   Take 1 capsule (300 mg total) by mouth 3 (three) times daily.         . metoprolol succinate (TOPROL-XL) 100 MG 24 hr tablet   Oral   Take 150 mg by mouth daily.          . mirtazapine (REMERON) 7.5 MG tablet   Oral   Take 7.5 mg by mouth at bedtime.         Marland Kitchen oxybutynin (DITROPAN) 5 MG tablet   Oral   Take 5 mg by mouth daily.          Marland Kitchen oxyCODONE (OXY IR/ROXICODONE) 5 MG immediate release tablet   Oral   Take 5 mg by mouth every 12  (twelve) hours. Pt is also able to use every six hours as needed for pain.         . pantoprazole (PROTONIX) 40 MG tablet   Oral   Take 40 mg by mouth daily.          . potassium chloride SA (K-DUR,KLOR-CON) 20 MEQ tablet   Oral   Take 20 mEq by mouth daily.         . pravastatin (PRAVACHOL) 40 MG tablet   Oral   Take 40 mg by mouth at bedtime.          . torsemide (DEMADEX) 10 MG tablet   Oral   Take 10 mg by mouth daily.         . calcium gluconate 500 MG tablet   Oral   Take 1 tablet (500 mg total) by mouth 2 (two) times daily. Patient not taking: Reported on 12/15/2015         . Oxycodone HCl 10 MG TABS   Oral   Take 1 tablet (10 mg total) by mouth every 6 (six) hours as needed. Patient not taking: Reported on 12/15/2015   60 tablet   0     Allergies Keflex and Ciprofloxacin  Family History  Problem Relation Age of Onset  . CVA Sister   . Heart attack Brother     Social History Social History  Substance Use Topics  . Smoking status: Never Smoker   . Smokeless tobacco: None  . Alcohol Use: No    Review of Systems Constitutional: Positive fever Cardiovascular: Negative for chest pain. Respiratory: Negative for shortness of breath. Negative cough Gastrointestinal: Negative for abdominal pain Genitourinary: Negative for dysuria. 10-point ROS otherwise negative, again limited by dementia.  ____________________________________________   PHYSICAL EXAM:  VITAL SIGNS: ED Triage Vitals  Enc Vitals Group     BP 12/15/15 1701 94/70 mmHg     Pulse Rate 12/15/15 1703 140     Resp 12/15/15 1701 27     Temp 12/15/15 1703 104.2 F (40.1 C)     Temp Source 12/15/15 1703 Rectal     SpO2 12/15/15 1703 92 %     Weight 12/15/15 1703 150 lb (68.04  kg)     Height 12/15/15 1703 5\' 2"  (1.575 m)     Head Cir --      Peak Flow --      Pain Score --      Pain Loc --      Pain Edu? --      Excl. in GC? --     Constitutional: Alert and oriented. Well  appearing and in no distress. Eyes: Normal exam ENT   Head: Normocephalic and atraumatic.   Mouth/Throat: Mucous membranes are moist. Cardiovascular: Irregular rhythm around 150 bpm Respiratory: Normal respiratory effort without tachypnea nor retractions. Breath sounds are clear  Gastrointestinal: Soft, moderate lower abdominal tenderness to palpation in the suprapubic region. No rebound or guarding. Musculoskeletal: Nontender with normal range of motion in all extremities. Neurologic:  Normal speech and language. No gross deficits. At baseline per nephew. Skin:  Skin is warm, dry and intact.  Psychiatric: Mood and affect are normal  ____________________________________________    EKG  EKG reviewed and interpreted by myself shows atrial fibrillation with rapid ventricular response at 155 bpm, narrow QRS, normal axis, nonspecific ST changes. No ST elevations.  ____________________________________________    RADIOLOGY  Chest x-ray most suggestive of acute bronchitis  ____________________________________________    INITIAL IMPRESSION / ASSESSMENT AND PLAN / ED COURSE  Pertinent labs & imaging results that were available during my care of the patient were reviewed by me and considered in my medical decision making (see chart for details).  Patient presents with fever to 104, tachycardic with atrial fibrillation which is her baseline. Cut sepsis initiated, patient started on empiric antibiotics currently awaiting lab results.  Labs show an elevated lactic acid of 2.1, normal white blood cell count. Urinalysis consistent with urinary tract infection. We'll admit to the hospital for sepsis likely due to UTI.        CRITICAL CARE Performed by: Minna Antis   Total critical care time: 45 minutes  Critical care time was exclusive of separately billable procedures and treating other patients.  Critical care was necessary to treat or prevent imminent or  life-threatening deterioration.  Critical care was time spent personally by me on the following activities: development of treatment plan with patient and/or surrogate as well as nursing, discussions with consultants, evaluation of patient's response to treatment, examination of patient, obtaining history from patient or surrogate, ordering and performing treatments and interventions, ordering and review of laboratory studies, ordering and review of radiographic studies, pulse oximetry and re-evaluation of patient's condition.   ____________________________________________   FINAL CLINICAL IMPRESSION(S) / ED DIAGNOSES  Sepsis UTI  Minna Antis, MD 12/15/15 1924

## 2015-12-15 NOTE — Plan of Care (Signed)
Problem: Fluid Volume: Goal: Hemodynamic stability will improve Outcome: Progressing BP improving with fluids.

## 2015-12-15 NOTE — Progress Notes (Signed)

## 2015-12-15 NOTE — ED Notes (Signed)
Pt blood pressure dropped from 96/53 @ 2112 to 68/39 @ 2135.  Charge nurse, ED provider, and admitting MD notified.  VORB from admitting MD for 500mL NS over 2 hours.

## 2015-12-16 LAB — CBC
HCT: 33.1 % — ABNORMAL LOW (ref 35.0–47.0)
Hemoglobin: 10.5 g/dL — ABNORMAL LOW (ref 12.0–16.0)
MCH: 29.8 pg (ref 26.0–34.0)
MCHC: 31.7 g/dL — AB (ref 32.0–36.0)
MCV: 93.9 fL (ref 80.0–100.0)
PLATELETS: 89 10*3/uL — AB (ref 150–440)
RBC: 3.53 MIL/uL — AB (ref 3.80–5.20)
RDW: 15 % — AB (ref 11.5–14.5)
WBC: 9 10*3/uL (ref 3.6–11.0)

## 2015-12-16 LAB — BASIC METABOLIC PANEL
ANION GAP: 6 (ref 5–15)
BUN: 33 mg/dL — ABNORMAL HIGH (ref 6–20)
CALCIUM: 7.2 mg/dL — AB (ref 8.9–10.3)
CO2: 23 mmol/L (ref 22–32)
CREATININE: 1.6 mg/dL — AB (ref 0.44–1.00)
Chloride: 112 mmol/L — ABNORMAL HIGH (ref 101–111)
GFR, EST AFRICAN AMERICAN: 32 mL/min — AB (ref >60)
GFR, EST NON AFRICAN AMERICAN: 27 mL/min — AB (ref >60)
Glucose, Bld: 114 mg/dL — ABNORMAL HIGH (ref 65–99)
Potassium: 4.1 mmol/L (ref 3.5–5.1)
SODIUM: 141 mmol/L (ref 135–145)

## 2015-12-16 LAB — BLOOD CULTURE ID PANEL (REFLEXED)
Acinetobacter baumannii: NOT DETECTED
CANDIDA ALBICANS: NOT DETECTED
CANDIDA GLABRATA: NOT DETECTED
CANDIDA TROPICALIS: NOT DETECTED
CARBAPENEM RESISTANCE: NOT DETECTED
Candida krusei: NOT DETECTED
Candida parapsilosis: NOT DETECTED
ENTEROBACTER CLOACAE COMPLEX: NOT DETECTED
ENTEROBACTERIACEAE SPECIES: DETECTED — AB
Enterococcus species: NOT DETECTED
Escherichia coli: DETECTED — AB
HAEMOPHILUS INFLUENZAE: NOT DETECTED
Klebsiella oxytoca: NOT DETECTED
Klebsiella pneumoniae: NOT DETECTED
Listeria monocytogenes: NOT DETECTED
Methicillin resistance: NOT DETECTED
NEISSERIA MENINGITIDIS: NOT DETECTED
PSEUDOMONAS AERUGINOSA: NOT DETECTED
Proteus species: NOT DETECTED
STAPHYLOCOCCUS AUREUS BCID: NOT DETECTED
STAPHYLOCOCCUS SPECIES: NOT DETECTED
STREPTOCOCCUS AGALACTIAE: NOT DETECTED
STREPTOCOCCUS PNEUMONIAE: NOT DETECTED
Serratia marcescens: NOT DETECTED
Streptococcus pyogenes: NOT DETECTED
Streptococcus species: NOT DETECTED
VANCOMYCIN RESISTANCE: NOT DETECTED

## 2015-12-16 LAB — MRSA PCR SCREENING: MRSA BY PCR: NEGATIVE

## 2015-12-16 MED ORDER — SODIUM CHLORIDE 0.9 % IV BOLUS (SEPSIS)
500.0000 mL | Freq: Once | INTRAVENOUS | Status: AC
  Administered 2015-12-16: 500 mL via INTRAVENOUS

## 2015-12-16 MED ORDER — SODIUM CHLORIDE 0.9 % IV BOLUS (SEPSIS)
1000.0000 mL | Freq: Once | INTRAVENOUS | Status: AC
  Administered 2015-12-16: 1000 mL via INTRAVENOUS

## 2015-12-16 MED ORDER — DILTIAZEM HCL 25 MG/5ML IV SOLN
10.0000 mg | Freq: Once | INTRAVENOUS | Status: AC
  Administered 2015-12-16: 10 mg via INTRAVENOUS

## 2015-12-16 MED ORDER — NOREPINEPHRINE BITARTRATE 1 MG/ML IV SOLN
0.0000 ug/min | INTRAVENOUS | Status: DC
  Administered 2015-12-16: 1 ug/min via INTRAVENOUS
  Filled 2015-12-16: qty 4

## 2015-12-16 MED ORDER — SODIUM CHLORIDE 0.9 % IV SOLN
1.0000 g | Freq: Two times a day (BID) | INTRAVENOUS | Status: DC
  Administered 2015-12-16 – 2015-12-19 (×7): 1 g via INTRAVENOUS
  Filled 2015-12-16 (×8): qty 1

## 2015-12-16 NOTE — Progress Notes (Signed)
Pharmacy Antibiotic Follow-up Note  Jill House is a 82 y.o. year-old female admitted on 12/15/2015.  The patient is currently on day 2 of vancomycin and Zosyn for sepsis secondary to UTI.  Assessment/Plan: After discussion with Dr. Nicholos Johns, BCID results were discussed and patient will be narrowed to meropenem. Further narrowing will be determined based on finalized susceptibilities. Will begin meropenem 1 g iv q 12 hours based on renal function.   Temp (24hrs), Avg:98.8 F (37.1 C), Min:96.8 F (36 C), Max:104.2 F (40.1 C)   Recent Labs Lab 12/15/15 1657 12/16/15 0830  WBC 7.7 9.0    Recent Labs Lab 12/15/15 1657 12/16/15 0830  CREATININE 1.87* 1.60*   Estimated Creatinine Clearance: 21.4 mL/min (by C-G formula based on Cr of 1.6).    Allergies  Allergen Reactions  . Keflex [Cephalexin] Other (See Comments)    Reaction:  Tremors   . Ciprofloxacin Rash and Swelling    Antimicrobials this admission: Vancomcycin 1/16 >> 1/17 Zosyn 1/16 >> 1/17 Meropenem 1/17 >>  Microbiology results: 01/16 BCx: E coli 01/16 UCx: GNR   01/16 MRSA PCR: negative  Thank you for allowing pharmacy to be a part of this patient's care.  Luisa Hart D PharmD 12/16/2015 11:39 AM

## 2015-12-16 NOTE — Progress Notes (Signed)
Patient awake and alert. Levophed on hold due to BP 101/61. She has not urinated and bladder scan done and shows > 353 ml urine. Order received for in and out catheter.

## 2015-12-16 NOTE — Progress Notes (Signed)
Patient has voided a large amount of urine prior to I and O catheter so catheter not performed at this time.

## 2015-12-16 NOTE — Progress Notes (Addendum)
Patient hypotensive despite IV fluid hydration, attempt 1 further saline bolus as respiratory status allows, will initiate Levophed if BP unresponsive we will use peripheral line in emergent situation until central line is able to be placed

## 2015-12-16 NOTE — Progress Notes (Signed)
Initial Nutrition Assessment  DOCUMENTATION CODES:   Non-severe (moderate) malnutrition in context of chronic illness  INTERVENTION:   Meals and Snacks: discussed possibility of diet advancement with Dione Housekeeper, RN plans to discuss with MD. RN also plans to monitor pt closely for swallowing difficulties, possible SLP consult if needed Medical Food Supplement Therapy: recommend combination of Magic cup and Mighty Shake supplements once diet advanced  NUTRITION DIAGNOSIS:   Malnutrition related to chronic illness as evidenced by mild depletion of body fat, moderate depletions of muscle mass.  GOAL:   Patient will meet greater than or equal to 90% of their needs   MONITOR:    (Energy Intake, Anthropometrics, Electrolyte/Renal Profile, Digestive Systemk)  REASON FOR ASSESSMENT:   Malnutrition Screening Tool    ASSESSMENT:    Pt admitted with sepsis due to UTI, afib with RVR, lethargic and confused; alert on visit this AM, pleasant, participated in physical assessment  Past Medical History  Diagnosis Date  . Atrial fibrillation (HCC)   . Dementia   . Hip joint replacement status   . Knee joint replacement status   . Osteoarthritis   . Hypertension   . Urinary incontinence      Diet Order:  NPO  Food And Nutrition Related history: pt reports appetite was good prior to admission, reports eating 2 meals per day; of note, pt is confused although appears to answer these questions appropriately, hx of dementia. No family at bedside  Skin:  Reviewed, no issues  Last BM:   no documented BM  Electrolyte and Renal Profile:  Recent Labs Lab 12/15/15 1657  BUN 40*  CREATININE 1.87*  NA 142  K 4.5   Glucose Profile:   Recent Labs  12/15/15 2234  GLUCAP 113*   Meds: NS at 75 ml/hr, remeron  Nutrition Focused Physical Exam: Nutrition-Focused physical exam completed. Findings are mild fat depletion, mild to moderate muscle depletion, and no edema.   Height:   Ht  Readings from Last 1 Encounters:  12/15/15  (1.575 m)    Weight: 5.1% wt loss in 4 months per weight encounters  Wt Readings from Last 1 Encounters:  12/15/15 148 lb 2.4 oz (67.2 kg)    Wt Readings from Last 10 Encounters:  12/15/15 148 lb 2.4 oz (67.2 kg)  11/08/15 150 lb 4.8 oz (68.176 kg)  08/13/15 156 lb 14.4 oz (71.169 kg)  11/12/14 145 lb (65.772 kg)    BMI:  Body mass index is 27.09 kg/(m^2).  Estimated Nutritional Needs:   Kcal:  1610-9604 kcals (BEE 1050, 1.2 AF, 1.1-1.3 IF)   Protein:  74-87 g (1.1-1.3 g/kg)   Fluid:  1675-2010 mL (25-30 ml/kG)   MODERATE Care Level   [facility] MS, RD, LDN [phone] Pager  [phone] Weekend/On-Call Pager

## 2015-12-16 NOTE — Progress Notes (Addendum)
[facility] [place] [facility]   PATIENT NAME: Jill House    MR#:  [id number]  DATE OF BIRTH:  03/26/1924  SUBJECTIVE:  Came in with fever 103,tachycardia and found to be in sepsis with RVR. On IV levophed Very confused HR in the 70's  REVIEW OF SYSTEMS:   Review of Systems  Unable to perform ROS: dementia    DRUG ALLERGIES:   Allergies  Allergen Reactions  . Keflex [Cephalexin] Other (See Comments)    Reaction:  Tremors   . Ciprofloxacin Rash and Swelling    VITALS:  Blood pressure 86/52, pulse 77, temperature 97 F (36.1 C), temperature source Rectal, resp. rate 19, height  (1.575 m), weight 67.2 kg (148 lb 2.4 oz), SpO2 100 %.  PHYSICAL EXAMINATION:   Physical Exam  GENERAL:  82 y.o.-year-old patient lying in the bed with no acute distress.  EYES: Pupils equal, round, reactive to light and accommodation. No scleral icterus. Extraocular muscles intact.  HEENT: Head atraumatic, normocephalic. Oropharynx and nasopharynx clear.  NECK:  Supple, no jugular venous distention. No thyroid enlargement, no tenderness.  LUNGS: Normal breath sounds bilaterally, no wheezing, rales, rhonchi. No use of accessory muscles of respiration.  CARDIOVASCULAR: S1, S2 normal. No murmurs, rubs, or gallops.  ABDOMEN: Soft, nontender, nondistended. Bowel sounds present. No organomegaly or mass.  EXTREMITIES: No cyanosis, clubbing or edema b/l.    NEUROLOGIC: Cranial nerves II through XII are intact. No focal Motor or sensory deficits b/l.   PSYCHIATRIC: The patient is alert and oriented x 3.  SKIN: No obvious rash, lesion, or ulcer.   LABORATORY PANEL:  CBC  Recent Labs Lab 12/15/15 1657  WBC 7.7  HGB 12.6  HCT 38.5  PLT 112*    Chemistries   Recent Labs Lab 12/15/15 1657  NA 142  K 4.5  CL 103  CO2 28  GLUCOSE 105*  BUN 40*  CREATININE 1.87*  CALCIUM 9.2  AST 174*  ALT 51  ALKPHOS 138*  BILITOT 1.0    Cardiac  Enzymes  Recent Labs Lab 12/15/15 1657  TROPONINI 0.03   RADIOLOGY:  Dg Chest Port 1 View  12/15/2015  CLINICAL DATA:  82 year old female with complaint of altered mental status. Febrile. 92% oxygen saturation on room air. Recently treated for urinary tract infection, still on antibiotics. EXAM: PORTABLE CHEST 1 VIEW COMPARISON:  Chest x-ray 11/08/2015. FINDINGS: Mild diffuse peribronchial cuffing. Lung volumes are normal. No consolidative airspace disease. No pleural effusions. No evidence of pulmonary edema. Heart size is borderline enlarged. The patient is rotated to the left on today's exam, resulting in distortion of the mediastinal contours and reduced diagnostic sensitivity and specificity for mediastinal pathology. Atherosclerosis in the thoracic aorta. Surgical clips at the level of the thoracic inlet, potentially from prior thyroidectomy. IMPRESSION: 1. Mild diffuse peribronchial cuffing, suggesting an acute bronchitis. 2. Atherosclerosis. 3. Borderline cardiomegaly. Electronically Signed   By: Trudie Reed M.D.   On: 12/15/2015 18:06   ASSESSMENT AND PLAN:  Jill House is a 82 y.o. female with a known history of atrial fibrillation, dementia, hip joint replacement multiple times, knee joint replacement, osteoporosis, osteoarthritis, hypertension, urinary incontinence, fracture of the TPR in September 2016 and after surgery sent to the rehabilitation, since then she could not recover much in physical activities and still living in rehabilitation Center.she came in the ER with fever up to 104, blood pressure was running low and she has tachycardia with atrial fibrillation heart rate running up  to 130-140.  * Sepsis due to UTI -Associated with the tachycardia, hypotension, altered mental status, fever -cont Zosyn IV and vancomycin -Patient is allergic to Cipro and cephalosporin, -follow up [facility] and Urine culture -IV fluids  -IV levophed for now ...wean as BP allows  *  Atrial fibrillation with rapid ventricular response. HR now in the 70's She takes metoprolol at home and currently  will hold it because of hypotension. She is on Eliquis for anticoagulation. Will resume for now however address to family the risk of bleeding with fall given risk for fall  * Chronic pain She is on oxycodone and gabapentin at nursing home with very high doses  will decrease the dose but continue with small dose that she is dependent on them.  *Hyperlipidemia Continue statin.  * Urinary incontinence Continue oxybutynin.  *chronic dementia  Will transfer her out once BP remains stable off Levophed.  Case discussed with Care Management/Social Worker.  CODE STATUS: DNR  DVT Prophylaxis: lovenox  TOTAL CRITICAL TIME TAKING CARE OF THIS PATIENT:  40 minutes.  >50% time spent on counselling and coordination of care Note: This dictation was prepared with Dragon dictation along with smaller phrase technology. Any transcriptional errors that result from this process are unintentional.  Keyauna Graefe M.D on 12/16/2015 at 8:36 AM  Between 7am to 6pm - Pager - [phone]  After 6pm go to [email or web address] - password [email or web address] [facility]  [facility] [place]  Office  [phone]  CC: Primary care physician; Margaretann Loveless, MD

## 2015-12-16 NOTE — Progress Notes (Signed)
Levo restarted due to blood MAP 57

## 2015-12-16 NOTE — Progress Notes (Signed)
Blood pressure 77/46 with MAP 58. Levophed resumed at 2

## 2015-12-17 ENCOUNTER — Inpatient Hospital Stay: Payer: Medicare Other

## 2015-12-17 LAB — URINE CULTURE

## 2015-12-17 LAB — TROPONIN I
TROPONIN I: 0.03 ng/mL (ref <0.031)
Troponin I: 0.04 ng/mL — ABNORMAL HIGH (ref <0.031)
Troponin I: 0.04 ng/mL — ABNORMAL HIGH (ref <0.031)

## 2015-12-17 MED ORDER — AMIODARONE HCL IN DEXTROSE 360-4.14 MG/200ML-% IV SOLN
60.0000 mg/h | INTRAVENOUS | Status: DC
  Administered 2015-12-17 (×2): 60 mg/h via INTRAVENOUS
  Filled 2015-12-17 (×2): qty 200

## 2015-12-17 MED ORDER — DILTIAZEM LOAD VIA INFUSION: 10.0000 mg | Freq: Once | INTRAVENOUS | Status: DC

## 2015-12-17 MED ORDER — AMIODARONE HCL IN DEXTROSE 360-4.14 MG/200ML-% IV SOLN
30.0000 mg/h | INTRAVENOUS | Status: DC
  Administered 2015-12-18 – 2015-12-19 (×2): 30 mg/h via INTRAVENOUS
  Filled 2015-12-17 (×3): qty 200

## 2015-12-17 MED ORDER — DILTIAZEM HCL 25 MG/5ML IV SOLN
10.0000 mg | Freq: Once | INTRAVENOUS | Status: AC
  Administered 2015-12-17: 10 mg via INTRAVENOUS

## 2015-12-17 MED ORDER — DIGOXIN 0.25 MG/ML IJ SOLN
0.2500 mg | Freq: Once | INTRAMUSCULAR | Status: AC
  Administered 2015-12-17: 0.25 mg via INTRAVENOUS
  Filled 2015-12-17: qty 2

## 2015-12-17 MED ORDER — AMIODARONE LOAD VIA INFUSION
150.0000 mg | Freq: Once | INTRAVENOUS | Status: AC
  Administered 2015-12-17: 150 mg via INTRAVENOUS
  Filled 2015-12-17: qty 83.34

## 2015-12-17 NOTE — Progress Notes (Signed)
Levophed decreased to 1 mcg/min due to blood pressure of 132/86. Dr. Allena Katz updated patient's POA via telephone of patient's condition and plan of care.

## 2015-12-17 NOTE — Progress Notes (Signed)
Pt c/o chest heaviness at 2230 with increased HR and RR.  2L O2 applied and MD informed.  New orders placed; 12-lead EKG, Troponin levels x3 q6 and IVP Cardizem.  All orders completed and reported back to on call MD.  Pt reports chest heaviness improved and HR and RR decreased.

## 2015-12-17 NOTE — Progress Notes (Signed)
Telepone consent  For PICC line obtained from Kristeen Miss, [place] for patient

## 2015-12-17 NOTE — Clinical Social Work Note (Signed)
Clinical Social Work Assessment  Patient Details  Name: Jill House MRN: [id number] Date of Birth: 1924-11-09  Date of referral:  12/17/15               Reason for consult:  Facility Placement                Permission sought to share information with:  Family Supports, Chartered certified accountant granted to share information::  Yes, Verbal Permission Granted  Name::        Agency::     Relationship::     Contact Information:     Housing/Transportation Living arrangements for the past 2 months:  [place] of Information:  Patient (neice) Patient Interpreter Needed:  None Criminal Activity/Legal Involvement Pertinent to Current Situation/Hospitalization:  No - Comment as needed Significant Relationships:   (neice) Lives with:   (neice) Do you feel safe going back to the place where you live?    Need for family participation in patient care:     Care giving concerns:  Patient has resided at [facility] for [place] for 3 months.   Social Worker assessment / plan:  CSW met with patient and her niece: Judson Roch: [phone] in patient's hospital room this afternoon. Patient was working very hard to breath and she did not say much but she was able to say hello and tell me about her dog Joey. Patient's niece stated patient has been at [facility] since September when she broke both her femurs. Patient niece stated that she was getting ready to discharge from [facility] back to her home when she became sick. Patient is in agreement with returning to [facility] if needed and CSW has confirmed with Maudie Mercury at [facility] that she can return when time.   Employment status:  Retired Nurse, adult PT Recommendations:    Information / Referral to community resources:  [place]  Patient/Family's Response to care:  Both patient and niece expressed appreciation for CSW assistance.  Patient/Family's Understanding of and Emotional  Response to Diagnosis, Current Treatment, and Prognosis:  Patient is willing to return to [facility] when time.  Emotional Assessment Appearance:  Appears stated age Attitude/Demeanor/Rapport:   (pleasant and cooperative) Affect (typically observed):  Calm, Appropriate Orientation:  Oriented to Self, Oriented to Situation, Oriented to Place Alcohol / Substance use:  Not Applicable Psych involvement (Current and /or in the community):  No (Comment)  Discharge Needs  Concerns to be addressed:  Care Coordination Readmission within the last 30 days:  No Current discharge risk:  None Barriers to Discharge:  No Barriers Identified   Shela Leff, LCSW 12/17/2015, 4:19 PM

## 2015-12-17 NOTE — Progress Notes (Signed)
PICC line placed in right upper arm 

## 2015-12-17 NOTE — [place] FL2 (Signed)
[place] MEDICAID FL2 LEVEL OF CARE SCREENING TOOL     IDENTIFICATION  Patient Name: Jill House Birthdate: [birth year] Sex: female Admission Date (Current Location): 12/15/2015  [facility] [address] and [place] Number:  Chiropodist and Address:  [facility], [address], [place], [place] [address]      Provider Number: [id number]  Attending Physician Name and Address:  [name], MD  Relative Name and Phone Number:       Current Level of Care: Hospital Recommended Level of Care: Skilled Nursing Facility Prior Approval Number:    Date Approved/Denied:   PASRR Number:    Discharge Plan: SNF    Current Diagnoses: Patient Active Problem List   Diagnosis Date Noted  . Sepsis (HCC) 12/15/2015  . UTI (lower urinary tract infection) 12/15/2015  . Hip fracture (HCC) 08/13/2015    Orientation RESPIRATION BLADDER Height & Weight    Self, Time, Situation, Place  O2 Incontinent   148 lbs.  BEHAVIORAL SYMPTOMS/MOOD NEUROLOGICAL BOWEL NUTRITION STATUS   (none)  (none) Continent Diet  AMBULATORY STATUS COMMUNICATION OF NEEDS Skin   Extensive Assist Verbally Normal                       Personal Care Assistance Level of Assistance  Dressing, Bathing Bathing Assistance: Limited assistance   Dressing Assistance: Limited assistance     Functional Limitations Info             SPECIAL CARE FACTORS FREQUENCY  PT (By licensed PT)                    Contractures Contractures Info: Not present    Additional Factors Info                  Current Medications (12/17/2015):  This is the current hospital active medication list Current Facility-Administered Medications  Medication Dose Route Frequency Provider Last Rate Last Dose  . 0.9 %  sodium chloride infusion   Intravenous Continuous Altamese Dilling, MD 75 mL/hr at 12/17/15 1100    . acetaminophen (TYLENOL) tablet 650 mg  650 mg Oral Q4H PRN Altamese Dilling, MD   650 mg at 12/17/15 0729  . alum & mag hydroxide-simeth (MAALOX/MYLANTA) 200-200-20 MG/5ML suspension 30 mL  30 mL Oral Q6H PRN Altamese Dilling, MD      . amiodarone (NEXTERONE PREMIX) 360 MG/200ML (1.8 mg/mL) IV infusion  60 mg/hr Intravenous Continuous [name], MD 33.3 mL/hr at 12/17/15 1200 60 mg/hr at 12/17/15 1200   Followed by  . amiodarone (NEXTERONE PREMIX) 360 MG/200ML (1.8 mg/mL) IV infusion  30 mg/hr Intravenous Continuous [name], MD      . antiseptic oral rinse (CPC / CETYLPYRIDINIUM CHLORIDE 0.05%) solution 7 mL  7 mL Mouth Rinse BID Enedina Finner, MD   7 mL at 12/17/15 1000  . apixaban (ELIQUIS) tablet 2.5 mg  2.5 mg Oral Q12H Altamese Dilling, MD   2.5 mg at 12/17/15 1034  . bisacodyl (DULCOLAX) suppository 10 mg  10 mg Rectal PRN Altamese Dilling, MD      . calcium carbonate (TUMS - dosed in mg elemental calcium) chewable tablet 200 mg of elemental calcium  1 tablet Oral BID Altamese Dilling, MD   200 mg of elemental calcium at 12/17/15 1058  . cholecalciferol (VITAMIN D) tablet 2,000 Units  2,000 Units Oral Daily Altamese Dilling, MD   2,000 Units at 12/17/15 1034  . [START ON 12/31/2015] cyanocobalamin ((VITAMIN B-12))  injection 1,000 mcg  1,000 mcg Intramuscular Q30 days Altamese Dilling, MD      . donepezil (ARICEPT) tablet 10 mg  10 mg Oral Daily Altamese Dilling, MD   10 mg at 12/16/15 2219  . ferrous sulfate tablet 325 mg  325 mg Oral BID WC Altamese Dilling, MD   325 mg at 12/17/15 0729  . gabapentin (NEURONTIN) capsule 300 mg  300 mg Oral BID Altamese Dilling, MD   300 mg at 12/17/15 1034  . meropenem (MERREM) 1 g in sodium chloride 0.9 % 100 mL IVPB  1 g Intravenous Q12H Shane Crutch, MD   1 g at 12/17/15 1034  . mirtazapine (REMERON) tablet 7.5 mg  7.5 mg Oral QHS Altamese Dilling, MD   7.5 mg at 12/16/15 2220  . norepinephrine (LEVOPHED) 4 mg in dextrose 5 % 250 mL (0.016 mg/mL) infusion   0-40 mcg/min Intravenous Titrated Wyatt Haste, MD   Stopped at 12/17/15 1200  . oxybutynin (DITROPAN) tablet 5 mg  5 mg Oral Daily Altamese Dilling, MD   5 mg at 12/17/15 1033  . oxyCODONE (Oxy IR/ROXICODONE) immediate release tablet 5 mg  5 mg Oral Q6H PRN Altamese Dilling, MD   5 mg at 12/16/15 2035  . pantoprazole (PROTONIX) EC tablet 40 mg  40 mg Oral QAC breakfast Altamese Dilling, MD   40 mg at 12/17/15 0729  . pravastatin (PRAVACHOL) tablet 40 mg  40 mg Oral QHS Altamese Dilling, MD   40 mg at 12/16/15 2220     Discharge Medications: Please see discharge summary for a list of discharge medications.  Relevant Imaging Results:  Relevant Lab Results:   Additional Information    York Spaniel, LCSW

## 2015-12-17 NOTE — Progress Notes (Signed)
[facility] [place] at [facility]   PATIENT NAME: Jill House    MR#:  [id number]  DATE OF BIRTH:  [birth year]  SUBJECTIVE:  She continues to be confused blood culture shows Escherichia coli, overnight had to have fluid boluses and now on low-dose Levophed. Patient's heart rate is also elevated in the 130s with atrial fibrillation  REVIEW OF SYSTEMS:   Review of Systems  Unable to perform ROS: dementia    DRUG ALLERGIES:   Allergies  Allergen Reactions  . Keflex [Cephalexin] Other (See Comments)    Reaction:  Tremors   . Ciprofloxacin Rash and Swelling    VITALS:  Blood pressure 106/74, pulse 118, temperature 100.2 F (37.9 C), temperature source Rectal, resp. rate 30, height  (1.575 m), weight 67.2 kg (148 lb 2.4 oz), SpO2 95 %.  PHYSICAL EXAMINATION:   Physical Exam  GENERAL:  82 y.o.-year-old patient lying in the bed critically ill  EYES: Pupils equal, round, reactive to light and accommodation. No scleral icterus. Extraocular muscles intact.  HEENT: Head atraumatic, normocephalic. Oropharynx and nasopharynx clear.  NECK:  Supple, no jugular venous distention. No thyroid enlargement, no tenderness.  LUNGS: Normal breath sounds bilaterally, no wheezing, rales, rhonchi. No use of accessory muscles of respiration.  CARDIOVASCULAR: Irregularly irregular. No murmurs, rubs, or gallops.  ABDOMEN: Soft, nontender, nondistended. Bowel sounds present. No organomegaly or mass.  EXTREMITIES: No cyanosis, clubbing or edema b/l.    NEUROLOGIC: Cranial nerves II through XII are intact. No focal Motor or sensory deficits b/l.   PSYCHIATRIC: The patient is alert and oriented x 3.  SKIN: No obvious rash, lesion, or ulcer.   LABORATORY PANEL:  CBC  Recent Labs Lab 12/16/15 0830  WBC 9.0  HGB 10.5*  HCT 33.1*  PLT 89*    Chemistries   Recent Labs Lab 12/15/15 1657 12/16/15 0830  NA 142 141  K 4.5 4.1  CL 103 112*  CO2 28 23   GLUCOSE 105* 114*  BUN 40* 33*  CREATININE 1.87* 1.60*  CALCIUM 9.2 7.2*  AST 174*  --   ALT 51  --   ALKPHOS 138*  --   BILITOT 1.0  --     Cardiac Enzymes  Recent Labs Lab 12/17/15 1124  TROPONINI 0.04*   RADIOLOGY:  Dg Chest 1 View  12/17/2015  CLINICAL DATA:  Status post PICC line adjustment. EXAM: CHEST 1 VIEW COMPARISON:  Earlier today at 1431 hours. FINDINGS: The right-sided PICC line has been repositioned, and currently terminates at the low SVC or cavoatrial junction. Difficult to visualize centrally. Midline trachea. Cardiomegaly accentuated by AP portable technique. Atherosclerosis in the transverse aorta. Small left pleural effusion. Minimal right hemidiaphragm elevation. No pneumothorax. Low lung volumes with resultant pulmonary interstitial prominence. Bibasilar airspace disease is similar. IMPRESSION: Right-sided PICC line terminating at the low SVC or cavoatrial junction. Cardiomegaly with persistent bibasilar airspace disease, favoring infection. Small left pleural effusion. Electronically Signed   By: Jeronimo Greaves M.D.   On: 12/17/2015 15:21   Dg Chest 1 View  12/17/2015  CLINICAL DATA:  82 year old female PICC line placement. Initial encounter. EXAM: CHEST 1 VIEW COMPARISON:  12/15/2015. FINDINGS: Portable AP upright view at 1433 hours. Right upper extremity approach PICC line has crossed midline via the innominate vein and the tip projects at the level of the left subclavian vein. Lower lung volumes with mildly increased patchy lung base opacity. Mild blunting of the left costophrenic angle. Stable cardiomegaly and mediastinal contours.  No pneumothorax. IMPRESSION: 1. Right upper extremity approach PICC line crossed midline into the left innominate vein and terminates at the left subclavian vein level. This should be repositioned. 2. Increased lung base patchy opacity, with top considerations of atelectasis and infection. Possible small left pleural effusion.  Electronically Signed   By: Odessa Fleming M.D.   On: 12/17/2015 14:43   Dg Chest Port 1 View  12/15/2015  CLINICAL DATA:  82 year old female with complaint of altered mental status. Febrile. 92% oxygen saturation on room air. Recently treated for urinary tract infection, still on antibiotics. EXAM: PORTABLE CHEST 1 VIEW COMPARISON:  Chest x-ray 11/08/2015. FINDINGS: Mild diffuse peribronchial cuffing. Lung volumes are normal. No consolidative airspace disease. No pleural effusions. No evidence of pulmonary edema. Heart size is borderline enlarged. The patient is rotated to the left on today's exam, resulting in distortion of the mediastinal contours and reduced diagnostic sensitivity and specificity for mediastinal pathology. Atherosclerosis in the thoracic aorta. Surgical clips at the level of the thoracic inlet, potentially from prior thyroidectomy. IMPRESSION: 1. Mild diffuse peribronchial cuffing, suggesting an acute bronchitis. 2. Atherosclerosis. 3. Borderline cardiomegaly. Electronically Signed   By: Trudie Reed M.D.   On: 12/15/2015 18:06   ASSESSMENT AND PLAN:  Jill House is a 82 y.o. female with a known history of atrial fibrillation, dementia, hip joint replacement multiple times, knee joint replacement, osteoporosis, osteoarthritis, hypertension, urinary incontinence, fracture of the TPR in September 2016 and after surgery sent to the rehabilitation, since then she could not recover much in physical activities and still living in rehabilitation Center.she came in the ER with fever up to 104, blood pressure was running low and she has tachycardia with atrial fibrillation heart rate running up to 130-140.  * Sepsis due to UTI Continue IV meropenem will await culture sensitivities Continue Levophed   * Atrial fibrillation with rapid ventricular response.  Start amiodarone drip. Continue eliquis Ask her primary cardiologist to come evaluate the patient  *Respiratory distress I will  check a chest x-ray  * Chronic pain She is on oxycodone and gabapentin at nursing home with very high doses  will decrease the dose but continue with small dose that she is dependent on them.  *Hyperlipidemia Continue statin.  * Urinary incontinence Continue oxybutynin.  *chronic dementia  Case discussed with her healthcare power of attorney   CODE STATUS: DNR  DVT Prophylaxis: lovenox  TOTAL CRITICAL TIME TAKING CARE OF THIS PATIENT:  40 minutes.      Note: This dictation was prepared with Dragon dictation along with smaller phrase technology. Any transcriptional errors that result from this process are unintentional.  [name] M.D on 12/17/2015 at 3:25 PM  Between 7am to 6pm - Pager - [phone]  After 6pm go to [email or web address] - password [email or web address] [facility]  [facility]  Office  [phone]  CC: Primary care physician; Margaretann Loveless, MD

## 2015-12-18 ENCOUNTER — Inpatient Hospital Stay
Admit: 2015-12-18 | Discharge: 2015-12-18 | Disposition: A | Discharge status: Home / Self Care | Payer: Medicare Other | Attending: Cardiovascular Disease | Admitting: Cardiovascular Disease

## 2015-12-18 ENCOUNTER — Inpatient Hospital Stay: Payer: Medicare Other

## 2015-12-18 DIAGNOSIS — E440 Moderate protein-calorie malnutrition: Secondary | ICD-10-CM | POA: Insufficient documentation

## 2015-12-18 LAB — CULTURE, BLOOD (ROUTINE X 2)

## 2015-12-18 NOTE — Progress Notes (Signed)
*  PRELIMINARY RESULTS* Echocardiogram 2D Echocardiogram has been performed.  Georgann Housekeeper Hege 12/18/2015, 11:31 AM

## 2015-12-18 NOTE — Progress Notes (Signed)
Jill House is a 82 y.o. female  [id number]  Primary Cardiologist: Adrian Blackwater Reason for Consultation: Atrial fibrillation  HPI: This is a 82 year old white female presented to the hospital with weakness dizziness and was found to have Escherichia coli in the blood. IVP was asked to evaluate the patient because has atrial fibrillation is not clear whether this recent or old. Patient is on already amiodarone drip and still in atrial fibrillation but controlled ventricular response rate.   Review of Systems: No chest pain but does feel short of breath and dizziness and weakness   Past Medical History  Diagnosis Date  . Atrial fibrillation (HCC)   . Dementia   . Hip joint replacement status   . Knee joint replacement status   . Osteoarthritis   . Hypertension   . Urinary incontinence     Medications Prior to Admission  Medication Sig Dispense Refill  . acetaminophen (TYLENOL) 325 MG tablet Take 650 mg by mouth every 4 (four) hours as needed for mild pain, fever or headache.    . alendronate (FOSAMAX) 70 MG tablet Take 70 mg by mouth once a week. Pt takes on Sunday.   Take with a full glass of water on an empty stomach.    Marland Kitchen alum & mag hydroxide-simeth (MAALOX PLUS) 400-400-40 MG/5ML suspension Take 30 mLs by mouth every 6 (six) hours as needed for indigestion (reflux/gas).    Marland Kitchen apixaban (ELIQUIS) 2.5 MG TABS tablet Take 2.5 mg by mouth every 12 (twelve) hours.     . bisacodyl (DULCOLAX) 10 MG suppository Place 10 mg rectally as needed for moderate constipation.    . calcium carbonate (TUMS - DOSED IN MG ELEMENTAL CALCIUM) 500 MG chewable tablet Chew 1 tablet by mouth 2 (two) times daily.    . cholecalciferol (VITAMIN D) 1000 UNITS tablet Take 2,000 Units by mouth daily.     . cyanocobalamin (,VITAMIN B-12,) 1000 MCG/ML injection Inject 1,000 mcg into the muscle every 30 (thirty) days. Pt uses on the first of every month.    . donepezil (ARICEPT) 10 MG tablet Take 10 mg by  mouth daily.    . ferrous sulfate 325 (65 FE) MG tablet Take 1 tablet (325 mg total) by mouth 2 (two) times daily with a meal. 60 tablet 3  . gabapentin (NEURONTIN) 300 MG capsule Take 1 capsule (300 mg total) by mouth 3 (three) times daily.    . metoprolol succinate (TOPROL-XL) 100 MG 24 hr tablet Take 150 mg by mouth daily.     . mirtazapine (REMERON) 7.5 MG tablet Take 7.5 mg by mouth at bedtime.    Marland Kitchen oxybutynin (DITROPAN) 5 MG tablet Take 5 mg by mouth daily.     Marland Kitchen oxyCODONE (OXY IR/ROXICODONE) 5 MG immediate release tablet Take 5 mg by mouth every 12 (twelve) hours. Pt is also able to use every six hours as needed for pain.    . pantoprazole (PROTONIX) 40 MG tablet Take 40 mg by mouth daily.     . potassium chloride SA (K-DUR,KLOR-CON) 20 MEQ tablet Take 20 mEq by mouth daily.    . pravastatin (PRAVACHOL) 40 MG tablet Take 40 mg by mouth at bedtime.     . torsemide (DEMADEX) 10 MG tablet Take 10 mg by mouth daily.    . calcium gluconate 500 MG tablet Take 1 tablet (500 mg total) by mouth 2 (two) times daily. (Patient not taking: Reported on 12/15/2015)    . Oxycodone HCl 10 MG TABS  Take 1 tablet (10 mg total) by mouth every 6 (six) hours as needed. (Patient not taking: Reported on 12/15/2015) 60 tablet 0     . antiseptic oral rinse  7 mL Mouth Rinse BID  . apixaban  2.5 mg Oral Q12H  . calcium carbonate  1 tablet Oral BID  . cholecalciferol  2,000 Units Oral Daily  . [START ON 12/31/2015] cyanocobalamin  1,000 mcg Intramuscular Q30 days  . donepezil  10 mg Oral Daily  . ferrous sulfate  325 mg Oral BID WC  . gabapentin  300 mg Oral BID  . meropenem (MERREM) IV  1 g Intravenous Q12H  . mirtazapine  7.5 mg Oral QHS  . oxybutynin  5 mg Oral Daily  . pantoprazole  40 mg Oral QAC breakfast  . pravastatin  40 mg Oral QHS    Infusions: . sodium chloride 75 mL/hr at 12/18/15 0540  . amiodarone 30 mg/hr (12/17/15 1754)  . norepinephrine (LEVOPHED) Adult infusion Stopped (12/17/15 1200)     Allergies  Allergen Reactions  . Keflex [Cephalexin] Other (See Comments)    Reaction:  Tremors   . Ciprofloxacin Rash and Swelling    Social History   Social History  . Marital Status: Widowed    Spouse Name: N/A  . Number of Children: N/A  . Years of Education: N/A   Occupational History  . Not on file.   Social History Main Topics  . Smoking status: Never Smoker   . Smokeless tobacco: Not on file  . Alcohol Use: No  . Drug Use: Not on file  . Sexual Activity: Not on file   Other Topics Concern  . Not on file   Social History Narrative    Family History  Problem Relation Age of Onset  . CVA Sister   . Heart attack Brother     PHYSICAL EXAM: Filed Vitals:   12/18/15 0600 12/18/15 0700  BP: 131/88 105/60  Pulse: 82 67  Temp: 99.1 F (37.3 C) 99.3 F (37.4 C)  Resp: 26 17     Intake/Output Summary (Last 24 hours) at 12/18/15 0857 Last data filed at 12/18/15 0540  Gross per 24 hour  Intake 2271.87 ml  Output      0 ml  Net 2271.87 ml    General:  Well appearing. No respiratory difficulty HEENT: normal Neck: supple. no JVD. Carotids 2+ bilat; no bruits. No lymphadenopathy or thryomegaly appreciated. Cor: PMI nondisplaced. Regular rate & rhythm. No rubs, gallops or murmurs. Lungs: clear Abdomen: soft, nontender, nondistended. No hepatosplenomegaly. No bruits or masses. Good bowel sounds. Extremities: no cyanosis, clubbing, rash, edema Neuro: alert & oriented x 3, cranial nerves grossly intact. moves all 4 extremities w/o difficulty. Affect pleasant.  ECG: Atrial fibrillation with rapid ventricular response rate low voltage and poor R-wave progression suggestive of old anteroseptal wall MI.  Results for orders placed or performed during the hospital encounter of 12/15/15 (from the past 24 hour(s))  Troponin I (q 6hr x 3)     Status: Abnormal   Collection Time: 12/17/15 11:24 AM  Result Value Ref Range   Troponin I 0.04 (H) <0.031 ng/mL   Dg  Chest 1 View  12/18/2015  CLINICAL DATA:  Shortness of breath, history atrial fibrillation, dementia, hypertension EXAM: CHEST 1 VIEW COMPARISON:  Portable exam 0539 hours compared to 12/17/2015 FINDINGS: RIGHT arm PICC line tip projects over SVC repositioned since prior study. Enlargement of cardiac silhouette. Atherosclerotic calcification aorta. Pulmonary vascularity grossly normal. Slight prominent  hila. BILATERAL perihilar and basilar infiltrates which could represent edema or infection, little changed. No gross pleural effusion or pneumothorax. Bones demineralized. IMPRESSION: Enlargement of cardiac silhouette. BILATERAL pulmonary infiltrates as above which could represent infection or edema. Electronically Signed   By: Ulyses Southward M.D.   On: 12/18/2015 07:34   Dg Chest 1 View  12/17/2015  CLINICAL DATA:  Status post PICC line adjustment. EXAM: CHEST 1 VIEW COMPARISON:  Earlier today at 1431 hours. FINDINGS: The right-sided PICC line has been repositioned, and currently terminates at the low SVC or cavoatrial junction. Difficult to visualize centrally. Midline trachea. Cardiomegaly accentuated by AP portable technique. Atherosclerosis in the transverse aorta. Small left pleural effusion. Minimal right hemidiaphragm elevation. No pneumothorax. Low lung volumes with resultant pulmonary interstitial prominence. Bibasilar airspace disease is similar. IMPRESSION: Right-sided PICC line terminating at the low SVC or cavoatrial junction. Cardiomegaly with persistent bibasilar airspace disease, favoring infection. Small left pleural effusion. Electronically Signed   By: Jeronimo Greaves M.D.   On: 12/17/2015 15:21   Dg Chest 1 View  12/17/2015  CLINICAL DATA:  82 year old female PICC line placement. Initial encounter. EXAM: CHEST 1 VIEW COMPARISON:  12/15/2015. FINDINGS: Portable AP upright view at 1433 hours. Right upper extremity approach PICC line has crossed midline via the innominate vein and the tip projects  at the level of the left subclavian vein. Lower lung volumes with mildly increased patchy lung base opacity. Mild blunting of the left costophrenic angle. Stable cardiomegaly and mediastinal contours. No pneumothorax. IMPRESSION: 1. Right upper extremity approach PICC line crossed midline into the left innominate vein and terminates at the left subclavian vein level. This should be repositioned. 2. Increased lung base patchy opacity, with top considerations of atelectasis and infection. Possible small left pleural effusion. Electronically Signed   By: Odessa Fleming M.D.   On: 12/17/2015 14:43     ASSESSMENT AND PLAN: Atrial fibrillation with rapid ventricular response rate on amiodarone right now and still in A. fib but the ventricular response rate has improved to 90/m. Advise continuation of amiodarone but may be transferred to telemetry.  KHAN,SHAUKAT A

## 2015-12-18 NOTE — Progress Notes (Addendum)
Patient transferred from ICU to 2A [id number], report from [place], Charity fundraiser. Oriented to room, Ascom phones, call bell and staff. Bed in low position. Fall safety plan reviewed, contract signed and placed on wall, yellow non-skid socks in place, bed alarm on. Full assessment to Epic; skin assessed with Larose Hires, RN. Telemetry box verified with tele clerk and Schuyler Amor NT: [id number]. Will continue to monitor.

## 2015-12-18 NOTE — Progress Notes (Signed)
Pharmacy Antibiotic Follow-up Note  Jill House is a 82 y.o. year-old female admitted on 12/15/2015.  The patient is currently on day 2 of vancomycin and Zosyn for sepsis secondary to UTI.  Assessment/Plan: After discussion with Dr. Nicholos Johns, BCID results were discussed and patient will be narrowed to meropenem. Further narrowing will be determined based on finalized susceptibilities. Will begin meropenem 1 g iv q 12 hours based on renal function.   1/19Sherron House with MD [name] about cultures results. Blood cultures grew E coli which is pan sensitive and urine cultures grew Klebsiella which is sensitive to all reported agents except Ampicillin.   MD would like to continue Merrem for one more day prior to switching to another agent. Will discuss with MD tomorrow.   Temp (24hrs), Avg:99.5 F (37.5 C), Min:98 F (36.7 C), Max:100.2 F (37.9 C)   Recent Labs Lab 12/15/15 1657 12/16/15 0830  WBC 7.7 9.0     Recent Labs Lab 12/15/15 1657 12/16/15 0830  CREATININE 1.87* 1.60*   Estimated Creatinine Clearance: 21.4 mL/min (by C-G formula based on Cr of 1.6).    Allergies  Allergen Reactions  . Keflex [Cephalexin] Other (See Comments)    Reaction:  Tremors   . Ciprofloxacin Rash and Swelling    Antimicrobials this admission: Vancomcycin 1/16 >> 1/17 Zosyn 1/16 >> 1/17 Meropenem 1/17 >>  Microbiology results: 01/16 BCx: E coli (pan sensitive)  01/16 UCx: Klebsiella (sensitive to all but Ampicillin)  01/16 MRSA PCR: negative  Thank you for allowing pharmacy to be a part of this patient's care.  Demetrius Charity PharmD 12/18/2015 2:41 PM

## 2015-12-18 NOTE — Progress Notes (Signed)
[facility] - [place] at [facility]   PATIENT NAME: Jill House    MR#:  [id number]  DATE OF BIRTH:  26-Apr-1924  SUBJECTIVE:  Patient feeling better heart rate improved off Levophed drip.    REVIEW OF SYSTEMS:   Review of Systems   CONSTITUTIONAL: No documented fever. No fatigue, weakness. No weight gain, no weight loss.  EYES: No blurry or double vision.  ENT: No tinnitus. No postnasal drip. No redness of the oropharynx.  RESPIRATORY: No cough, no wheeze, no hemoptysis. No dyspnea.  CARDIOVASCULAR: No chest pain. No orthopnea. No palpitations. No syncope.  GASTROINTESTINAL: No nausea, no vomiting or diarrhea. No abdominal pain. No melena or hematochezia.  GENITOURINARY:  No urgency. No frequency. No dysuria. No hematuria. No obstructive symptoms. No discharge. No pain. No significant abnormal bleeding ENDOCRINE: No polyuria or nocturia. No heat or cold intolerance.  HEMATOLOGY: No anemia. No bruising. No bleeding. No purpura. No petechiae INTEGUMENTARY: No rashes. No lesions.  MUSCULOSKELETAL: No arthritis. No swelling. No gout.  NEUROLOGIC: No numbness, tingling, or ataxia. No seizure-type activity.  PSYCHIATRIC: No anxiety. No insomnia. No ADD.      DRUG ALLERGIES:   Allergies  Allergen Reactions  . Keflex [Cephalexin] Other (See Comments)    Reaction:  Tremors   . Ciprofloxacin Rash and Swelling    VITALS:  Blood pressure 126/70, pulse 80, temperature 98 F (36.7 C), temperature source Tympanic, resp. rate 20, height  (1.575 m), weight 67.2 kg (148 lb 2.4 oz), SpO2 99 %.  PHYSICAL EXAMINATION:   Physical Exam  GENERAL:  82 y.o.-year-old patient lying in the bedchronically ill-appearingl  EYES: Pupils equal, round, reactive to light and accommodation. No scleral icterus. Extraocular muscles intact.  HEENT: Head atraumatic, normocephalic. Oropharynx and nasopharynx clear.  NECK:  Supple, no jugular venous distention. No  thyroid enlargement, no tenderness.  LUNGS: Normal breath sounds bilaterally, no wheezing, rales, rhonchi. No use of accessory muscles of respiration.  CARDIOVASCULAR: Irregularly irregular. No murmurs, rubs, or gallops.  ABDOMEN: Soft, nontender, nondistended. Bowel sounds present. No organomegaly or mass.  EXTREMITIES: No cyanosis, clubbing or edema b/l.    NEUROLOGIC: Cranial nerves II through XII are intact. No focal Motor or sensory deficits b/l.   PSYCHIATRIC: The patient is alert and oriented x 3.  SKIN: No obvious rash, lesion, or ulcer.   LABORATORY PANEL:  CBC  Recent Labs Lab 12/16/15 0830  WBC 9.0  HGB 10.5*  HCT 33.1*  PLT 89*    Chemistries   Recent Labs Lab 12/15/15 1657 12/16/15 0830  NA 142 141  K 4.5 4.1  CL 103 112*  CO2 28 23  GLUCOSE 105* 114*  BUN 40* 33*  CREATININE 1.87* 1.60*  CALCIUM 9.2 7.2*  AST 174*  --   ALT 51  --   ALKPHOS 138*  --   BILITOT 1.0  --     Cardiac Enzymes  Recent Labs Lab 12/17/15 1124  TROPONINI 0.04*   RADIOLOGY:  Dg Chest 1 View  12/18/2015  CLINICAL DATA:  Shortness of breath, history atrial fibrillation, dementia, hypertension EXAM: CHEST 1 VIEW COMPARISON:  Portable exam 0539 hours compared to 12/17/2015 FINDINGS: RIGHT arm PICC line tip projects over SVC repositioned since prior study. Enlargement of cardiac silhouette. Atherosclerotic calcification aorta. Pulmonary vascularity grossly normal. Slight prominent hila. BILATERAL perihilar and basilar infiltrates which could represent edema or infection, little changed. No gross pleural effusion or pneumothorax. Bones demineralized. IMPRESSION: Enlargement of cardiac silhouette. BILATERAL  pulmonary infiltrates as above which could represent infection or edema. Electronically Signed   By: Ulyses Southward M.D.   On: 12/18/2015 07:34   Dg Chest 1 View  12/17/2015  CLINICAL DATA:  Status post PICC line adjustment. EXAM: CHEST 1 VIEW COMPARISON:  Earlier today at 1431 hours.  FINDINGS: The right-sided PICC line has been repositioned, and currently terminates at the low SVC or cavoatrial junction. Difficult to visualize centrally. Midline trachea. Cardiomegaly accentuated by AP portable technique. Atherosclerosis in the transverse aorta. Small left pleural effusion. Minimal right hemidiaphragm elevation. No pneumothorax. Low lung volumes with resultant pulmonary interstitial prominence. Bibasilar airspace disease is similar. IMPRESSION: Right-sided PICC line terminating at the low SVC or cavoatrial junction. Cardiomegaly with persistent bibasilar airspace disease, favoring infection. Small left pleural effusion. Electronically Signed   By: Jeronimo Greaves M.D.   On: 12/17/2015 15:21   Dg Chest 1 View  12/17/2015  CLINICAL DATA:  82 year old female PICC line placement. Initial encounter. EXAM: CHEST 1 VIEW COMPARISON:  12/15/2015. FINDINGS: Portable AP upright view at 1433 hours. Right upper extremity approach PICC line has crossed midline via the innominate vein and the tip projects at the level of the left subclavian vein. Lower lung volumes with mildly increased patchy lung base opacity. Mild blunting of the left costophrenic angle. Stable cardiomegaly and mediastinal contours. No pneumothorax. IMPRESSION: 1. Right upper extremity approach PICC line crossed midline into the left innominate vein and terminates at the left subclavian vein level. This should be repositioned. 2. Increased lung base patchy opacity, with top considerations of atelectasis and infection. Possible small left pleural effusion. Electronically Signed   By: Odessa Fleming M.D.   On: 12/17/2015 14:43   ASSESSMENT AND PLAN:  Jill House is a 82 y.o. female with a known history of atrial fibrillation, dementia, hip joint replacement multiple times, knee joint replacement, osteoporosis, osteoarthritis, hypertension, urinary incontinence, fracture of the TPR in September 2016 and after surgery sent to the  rehabilitation, since then she could not recover much in physical activities and still living in rehabilitation Center.she came in the ER with fever up to 104, blood pressure was running low and she has tachycardia with atrial fibrillation heart rate running up to 130-140.  * Sepsis due to UTI Due to Escherichia coli and Klebsiella both sensitive to meropenem Continue IV meropenem    * Atrial fibrillation with rapid ventricular response.  Continue amiodarone drip  Continue eliquisAppreciated Dr. Welton Flakes input   *Respiratory distress  asked x-ray with infiltrates I will check a BNP level due to hypotension we'll hold off on any home Lasix breathing improved  * Chronic pain She is on oxycodone and gabapentin at nursing home with very high doses  will decrease the dose but continue with small dose that she is dependent on them.  *Hyperlipidemia Continue statin.  * Urinary incontinence Continue oxybutynin.  *chronic dementia     CODE STATUS: DNR  DVT Prophylaxis: lovenox  TOTAL  TIME TAKING CARE OF THIS PATIENT:  35 minutes.      Note: This dictation was prepared with Dragon dictation along with smaller phrase technology. Any transcriptional errors that result from this process are unintentional.  [name] M.D on 12/18/2015 at 1:01 PM  Between 7am to 6pm - Pager - [phone]  After 6pm go to [email or web address] - password [email or web address] [facility]  [place] [facility]  Office  [phone]  CC: Primary care physician; Margaretann Loveless, MD

## 2015-12-19 ENCOUNTER — Inpatient Hospital Stay: Payer: Medicare Other

## 2015-12-19 LAB — CBC
HCT: 28.2 % — ABNORMAL LOW (ref 35.0–47.0)
HEMOGLOBIN: 9.3 g/dL — AB (ref 12.0–16.0)
MCH: 30.5 pg (ref 26.0–34.0)
MCHC: 32.9 g/dL (ref 32.0–36.0)
MCV: 92.6 fL (ref 80.0–100.0)
Platelets: 83 10*3/uL — ABNORMAL LOW (ref 150–440)
RBC: 3.04 MIL/uL — ABNORMAL LOW (ref 3.80–5.20)
RDW: 15 % — ABNORMAL HIGH (ref 11.5–14.5)
WBC: 5.8 10*3/uL (ref 3.6–11.0)

## 2015-12-19 LAB — BASIC METABOLIC PANEL
Anion gap: 7 (ref 5–15)
BUN: 16 mg/dL (ref 6–20)
CALCIUM: 6.9 mg/dL — AB (ref 8.9–10.3)
CHLORIDE: 110 mmol/L (ref 101–111)
CO2: 20 mmol/L — ABNORMAL LOW (ref 22–32)
CREATININE: 0.96 mg/dL (ref 0.44–1.00)
GFR calc non Af Amer: 51 mL/min — ABNORMAL LOW (ref >60)
GFR, EST AFRICAN AMERICAN: 59 mL/min — AB (ref >60)
Glucose, Bld: 190 mg/dL — ABNORMAL HIGH (ref 65–99)
Potassium: 3.6 mmol/L (ref 3.5–5.1)
SODIUM: 137 mmol/L (ref 135–145)

## 2015-12-19 MED ORDER — ERTAPENEM SODIUM 1 G IJ SOLR: 1.0000 g | INTRAMUSCULAR | Status: DC

## 2015-12-19 MED ORDER — FUROSEMIDE 20 MG PO TABS
20.0000 mg | ORAL_TABLET | Freq: Two times a day (BID) | ORAL | Status: DC
  Administered 2015-12-19 – 2015-12-20 (×2): 20 mg via ORAL
  Filled 2015-12-19 (×2): qty 1

## 2015-12-19 MED ORDER — METOPROLOL TARTRATE 25 MG PO TABS
75.0000 mg | ORAL_TABLET | Freq: Two times a day (BID) | ORAL | Status: DC
  Administered 2015-12-19 – 2015-12-20 (×2): 75 mg via ORAL
  Filled 2015-12-19 (×2): qty 3

## 2015-12-19 MED ORDER — ERTAPENEM SODIUM 1 G IJ SOLR
1.0000 g | INTRAMUSCULAR | Status: DC
  Filled 2015-12-19: qty 1

## 2015-12-19 MED ORDER — METOPROLOL TARTRATE 50 MG PO TABS
50.0000 mg | ORAL_TABLET | Freq: Two times a day (BID) | ORAL | Status: DC
  Administered 2015-12-19: 50 mg via ORAL
  Filled 2015-12-19: qty 1

## 2015-12-19 MED ORDER — SODIUM CHLORIDE 0.9 % IJ SOLN
10.0000 mL | Freq: Two times a day (BID) | INTRAMUSCULAR | Status: DC
  Administered 2015-12-19 – 2015-12-20 (×3): 10 mL

## 2015-12-19 MED ORDER — SODIUM CHLORIDE 0.9 % IJ SOLN: 10.0000 mL | INTRAMUSCULAR | Status: DC | PRN

## 2015-12-19 MED ORDER — SODIUM CHLORIDE 0.9 % IV SOLN
1.0000 g | INTRAVENOUS | Status: DC
  Administered 2015-12-19: 1 g via INTRAVENOUS
  Filled 2015-12-19 (×2): qty 1

## 2015-12-19 NOTE — Care Management Note (Signed)
Case Management Note  Patient Details  Name: Jill House MRN: [id number] Date of Birth: September 07, 1924  Subjective/Objective:    Case discussed with Dr. Allena Katz. Discharge planned for tomorrow back to [place], CSW aware.                 Action/Plan:   Expected Discharge Date:                  Expected Discharge Plan:  Skilled Nursing Facility  In-House Referral:  Clinical Social Work  Discharge planning Services     Post Acute Care Choice:    Choice offered to:     DME Arranged:    DME Agency:     HH Arranged:    HH Agency:     Status of Service:  Completed, signed off  Medicare Important Message Given:  Yes Date Medicare IM Given:    Medicare IM give by:    Date Additional Medicare IM Given:    Additional Medicare Important Message give by:     If discussed at Long Length of Stay Meetings, dates discussed:    Additional Comments:  Marily Memos, RN 12/19/2015, 11:06 AM

## 2015-12-19 NOTE — Progress Notes (Signed)
Pharmacy Antibiotic Follow-up Note  Jill House is a 82 y.o. year-old female admitted on 12/15/2015.  The patient is  currently on day 3 of Merrem for sepsis secondary to UTI.  Assessment/Plan: After discussion with Dr. Nicholos Johns, BCID results were discussed and patient will be narrowed to meropenem. Further narrowing will be determined based on finalized susceptibilities. Will begin meropenem 1 g iv q 12 hours based on renal function.   1/19[name] with MD [name] about cultures results. Blood cultures grew E coli which is pan sensitive and urine cultures grew Klebsiella which is sensitive to all reported agents except Ampicillin.   MD would like to continue Merrem for one more day prior to switching to another agent. Will discuss with MD tomorrow.   1/20: Spoke with MD Allena Katz and he  does not feel comfortable transitioning the patient to Rocephin due to Cefazolin allergy and will discharge patient on Ertapenem to finish therapy.   Temp (24hrs), Avg:98.3 F (36.8 C), Min:98.1 F (36.7 C), Max:98.5 F (36.9 C)   Recent Labs Lab 12/15/15 1657 12/16/15 0830 12/19/15 0456  WBC 7.7 9.0 5.8     Recent Labs Lab 12/15/15 1657 12/16/15 0830 12/19/15 0456  CREATININE 1.87* 1.60* 0.96   Estimated Creatinine Clearance: 35.7 mL/min (by C-G formula based on Cr of 0.96).    Allergies  Allergen Reactions  . Keflex [Cephalexin] Other (See Comments)    Reaction:  Tremors   . Ciprofloxacin Rash and Swelling    Antimicrobials this admission: Vancomcycin 1/16 >> 1/17 Zosyn 1/16 >> 1/17 Meropenem 1/17 >>  Microbiology results: 01/16 BCx: E coli (pan sensitive)  01/16 UCx: Klebsiella (sensitive to all but Ampicillin)  01/16 MRSA PCR: negative  Thank you for allowing pharmacy to be a part of this patient's care.  Demetrius Charity PharmD 12/19/2015 3:08 PM

## 2015-12-19 NOTE — Progress Notes (Signed)
Patient rested quietly, no complaints today. Remains a.fib rate controlled, meds adjusted by Dr. Welton Flakes today. Will continue to monitor.

## 2015-12-19 NOTE — Discharge Instructions (Signed)
°  DIET:  °Cardiac diet ° °DISCHARGE CONDITION:  °Stable ° °ACTIVITY:  °Activity as tolerated ° °OXYGEN:  °Home Oxygen: No. °  °Oxygen Delivery: room air ° °DISCHARGE LOCATION:  °nursing home  ° ° °ADDITIONAL DISCHARGE INSTRUCTION: ° ° °If you experience worsening of your admission symptoms, develop shortness of breath, life threatening emergency, suicidal or homicidal thoughts you must seek medical attention immediately by calling 911 or calling your MD immediately  if symptoms less severe. ° °You Must read complete instructions/literature along with all the possible adverse reactions/side effects for all the Medicines you take and that have been prescribed to you. Take any new Medicines after you have completely understood and accpet all the possible adverse reactions/side effects.  ° °Please note ° °You were cared for by a hospitalist during your hospital stay. If you have any questions about your discharge medications or the care you received while you were in the hospital after you are discharged, you can call the unit and asked to speak with the hospitalist on call if the hospitalist that took care of you is not available. Once you are discharged, your primary care physician will handle any further medical issues. Please note that NO REFILLS for any discharge medications will be authorized once you are discharged, as it is imperative that you return to your primary care physician (or establish a relationship with a primary care physician if you do not have one) for your aftercare needs so that they can reassess your need for medications and monitor your lab values. ° ° °

## 2015-12-19 NOTE — Progress Notes (Signed)
SUBJECTIVE: Patient appears to be quite confused   Filed Vitals:   12/19/15 0145 12/19/15 0349 12/19/15 0610 12/19/15 0757  BP:   144/86 153/77  Pulse:   94 68  Temp:   98.1 F (36.7 C)   TempSrc:   Oral   Resp:      Height:      Weight:      SpO2: 94% 93% 95%     Intake/Output Summary (Last 24 hours) at 12/19/15 0900 Last data filed at 12/19/15 0225  Gross per 24 hour  Intake 1221.14 ml  Output      0 ml  Net 1221.14 ml    LABS: Basic Metabolic Panel:  Recent Labs  16/10/96 0456  NA 137  K 3.6  CL 110  CO2 20*  GLUCOSE 190*  BUN 16  CREATININE 0.96  CALCIUM 6.9*   Liver Function Tests: No results for input(s): AST, ALT, ALKPHOS, BILITOT, PROT, ALBUMIN in the last 72 hours. No results for input(s): LIPASE, AMYLASE in the last 72 hours. CBC:  Recent Labs  12/19/15 0456  WBC 5.8  HGB 9.3*  HCT 28.2*  MCV 92.6  PLT 83*   Cardiac Enzymes:  Recent Labs  12/16/15 2326 12/17/15 0521 12/17/15 1124  TROPONINI 0.04* 0.03 0.04*   BNP: Invalid input(s): POCBNP D-Dimer: No results for input(s): DDIMER in the last 72 hours. Hemoglobin A1C: No results for input(s): HGBA1C in the last 72 hours. Fasting Lipid Panel: No results for input(s): CHOL, HDL, LDLCALC, TRIG, CHOLHDL, LDLDIRECT in the last 72 hours. Thyroid Function Tests: No results for input(s): TSH, T4TOTAL, T3FREE, THYROIDAB in the last 72 hours.  Invalid input(s): FREET3 Anemia Panel: No results for input(s): VITAMINB12, FOLATE, FERRITIN, TIBC, IRON, RETICCTPCT in the last 72 hours.   PHYSICAL EXAM General: Well developed, well nourished, in no acute distress HEENT:  Normocephalic and atramatic Neck:  No JVD.  Lungs: Clear bilaterally to auscultation and percussion. Heart: HRRR . Normal S1 and S2 without gallops or murmurs.  Abdomen: Bowel sounds are positive, abdomen soft and non-tender  Msk:  Back normal, normal gait. Normal strength and tone for age. Extremities: No clubbing,  cyanosis or edema.   Neuro: Alert and oriented X 3. Psych:  Good affect, responds appropriately  TELEMETRY: Atrial fibrillation about 90 bpm  ASSESSMENT AND PLAN: A. fib with rapid ventricular response rate still on amiodarone has not converted to sinus rhythm. Advise changing the patient to metoprolol and Cardizem and discontinue amiodarone.  Principal Problem:   Sepsis (HCC) Active Problems:   UTI (lower urinary tract infection)   Malnutrition of moderate degree    Arcadio Cope A, MD, [facility] 12/19/2015 9:00 AM

## 2015-12-19 NOTE — Care Management Important Message (Signed)
Important Message  Patient Details  Name: Noele Icenhour MRN: [id number] Date of Birth: [birth year]   Medicare Important Message Given:  Yes    Kierre Hintz A, RN 12/19/2015, 8:05 AM

## 2015-12-19 NOTE — Discharge Summary (Addendum)
Jill House, 82 y.o., DOB 05-21-1924, MRN [id number]. Admission date: 12/15/2015 Discharge Date 12/20/2015 Primary MD Margaretann Loveless, MD Admitting Physician Altamese Dilling, MD  Admission Diagnosis  UTI (lower urinary tract infection) [N39.0] Sepsis, due to unspecified organism [facility]) [A41.9]  Discharge Diagnosis   Principal Problem:  Ecoli Sepsis (HCC)  UTI (lower urinary tract infection) present on admission, UC klebsiella  Atrial fibrillation with rapid ventricular rate  Hypotension Chronic dementia  Hospital Course Jill House is a 82 y.o. female with a known history of atrial fibrillation, dementia, hip joint replacement multiple times, knee joint replacement, osteoporosis, osteoarthritis, hypertension, urinary incontinence, fracture of the TPR in September 2016 and after surgery sent to the rehabilitation, since then she could not recover much in physical activities and still living in rehabilitation Center. Patient was seen in the emergency room and noted to have very high fevers and was tachycardic. With atrial fibrillation in the 130s to 140s range. Initially was noted to have urinary tract infection. Subsequent blood cultures confirmed infection with Escherichia coli as well as UC positive for Klebsiella pneumonia. Patient had to be placed in the ICU. She required pressors. Patient was also seen by cardiology due to atrial fibrillation with rapid ventricular rate. Her heart rate improved with amiodarone therapy. Currently heart rate is much better she is continued on Lopressor she is chronically on Eliquis. Due to cephalosporin allergy. Patient will finish her course of Invanz for UTI. Her last dose will be 12/29/15 Consults  cardiology  Significant Tests:  PICC line placed on right arm for IV abxs (jan 18th)    Dg Chest 1 View  12/18/2015  CLINICAL DATA:  Shortness of breath, history atrial fibrillation, dementia, hypertension EXAM: CHEST 1 VIEW COMPARISON:  Portable  exam 0539 hours compared to 12/17/2015 FINDINGS: RIGHT arm PICC line tip projects over SVC repositioned since prior study. Enlargement of cardiac silhouette. Atherosclerotic calcification aorta. Pulmonary vascularity grossly normal. Slight prominent hila. BILATERAL perihilar and basilar infiltrates which could represent edema or infection, little changed. No gross pleural effusion or pneumothorax. Bones demineralized. IMPRESSION: Enlargement of cardiac silhouette. BILATERAL pulmonary infiltrates as above which could represent infection or edema. Electronically Signed   By: Ulyses Southward M.D.   On: 12/18/2015 07:34   Dg Chest 1 View  12/17/2015  CLINICAL DATA:  Status post PICC line adjustment. EXAM: CHEST 1 VIEW COMPARISON:  Earlier today at 1431 hours. FINDINGS: The right-sided PICC line has been repositioned, and currently terminates at the low SVC or cavoatrial junction. Difficult to visualize centrally. Midline trachea. Cardiomegaly accentuated by AP portable technique. Atherosclerosis in the transverse aorta. Small left pleural effusion. Minimal right hemidiaphragm elevation. No pneumothorax. Low lung volumes with resultant pulmonary interstitial prominence. Bibasilar airspace disease is similar. IMPRESSION: Right-sided PICC line terminating at the low SVC or cavoatrial junction. Cardiomegaly with persistent bibasilar airspace disease, favoring infection. Small left pleural effusion. Electronically Signed   By: Jeronimo Greaves M.D.   On: 12/17/2015 15:21   Dg Chest 1 View  12/17/2015  CLINICAL DATA:  82 year old female PICC line placement. Initial encounter. EXAM: CHEST 1 VIEW COMPARISON:  12/15/2015. FINDINGS: Portable AP upright view at 1433 hours. Right upper extremity approach PICC line has crossed midline via the innominate vein and the tip projects at the level of the left subclavian vein. Lower lung volumes with mildly increased patchy lung base opacity. Mild blunting of the left costophrenic angle.  Stable cardiomegaly and mediastinal contours. No pneumothorax. IMPRESSION: 1. Right upper extremity approach PICC line crossed  midline into the left innominate vein and terminates at the left subclavian vein level. This should be repositioned. 2. Increased lung base patchy opacity, with top considerations of atelectasis and infection. Possible small left pleural effusion. Electronically Signed   By: Odessa Fleming M.D.   On: 12/17/2015 14:43   Dg Chest Port 1 View  12/15/2015  CLINICAL DATA:  82 year old female with complaint of altered mental status. Febrile. 92% oxygen saturation on room air. Recently treated for urinary tract infection, still on antibiotics. EXAM: PORTABLE CHEST 1 VIEW COMPARISON:  Chest x-ray 11/08/2015. FINDINGS: Mild diffuse peribronchial cuffing. Lung volumes are normal. No consolidative airspace disease. No pleural effusions. No evidence of pulmonary edema. Heart size is borderline enlarged. The patient is rotated to the left on today's exam, resulting in distortion of the mediastinal contours and reduced diagnostic sensitivity and specificity for mediastinal pathology. Atherosclerosis in the thoracic aorta. Surgical clips at the level of the thoracic inlet, potentially from prior thyroidectomy. IMPRESSION: 1. Mild diffuse peribronchial cuffing, suggesting an acute bronchitis. 2. Atherosclerosis. 3. Borderline cardiomegaly. Electronically Signed   By: Trudie Reed M.D.   On: 12/15/2015 18:06   Dg Hips Bilat With Pelvis 3-4 Views  12/19/2015  CLINICAL DATA:  Bilateral hip pain, left hip replacement. EXAM: DG HIP (WITH OR WITHOUT PELVIS) 3-4V BILAT COMPARISON:  None. FINDINGS: Changes of left hip replacement. There appears to have been revision at some point. Small radiopaque densities noted around the left hip and proximal left femur. This could be related to particle disease. Mild degenerative changes in the right hip with joint space narrowing and spurring. No acute bony abnormality.  Specifically, no fracture, subluxation, or dislocation. Soft tissues are intact. IMPRESSION: Left hip replacement as described above.  No acute bony abnormality. Electronically Signed   By: Charlett Nose M.D.   On: 12/19/2015 14:33   Today   Subjective:   Jill House  feels much better denies any significant complaints  Objective:   Blood pressure 148/84, pulse 67, temperature 97.7 F (36.5 C), temperature source Oral, resp. rate 20, height 5\' 2"  (1.575 m), weight 67.2 kg (148 lb 2.4 oz), SpO2 95 %.  .  Intake/Output Summary (Last 24 hours) at 12/20/15 1015 Last data filed at 12/20/15 1002  Gross per 24 hour  Intake      0 ml  Output      0 ml  Net      0 ml    Exam VITAL SIGNS: Blood pressure 148/84, pulse 67, temperature 97.7 F (36.5 C), temperature source Oral, resp. rate 20, height 5\' 2"  (1.575 m), weight 67.2 kg (148 lb 2.4 oz), SpO2 95 %.  GENERAL:  82 y.o.-year-old patient lying in the bed with no acute distress.  EYES: Pupils equal, round, reactive to light and accommodation. No scleral icterus. Extraocular muscles intact.  HEENT: Head atraumatic, normocephalic. Oropharynx and nasopharynx clear.  NECK:  Supple, no jugular venous distention. No thyroid enlargement, no tenderness.  LUNGS: Normal breath sounds bilaterally, no wheezing, rales,rhonchi or crepitation. No use of accessory muscles of respiration.  CARDIOVASCULAR: S1, S2 normal. No murmurs, rubs, or gallops.  ABDOMEN: Soft, nontender, nondistended. Bowel sounds present. No organomegaly or mass.  EXTREMITIES: No pedal edema, cyanosis, or clubbing. Right arm PICC NEUROLOGIC: Cranial nerves II through XII are intact. Muscle strength 5/5 in all extremities. Sensation intact. Gait not checked.  PSYCHIATRIC: The patient is alert and oriented x 3.  SKIN: No obvious rash, lesion, or ulcer.   Data Review  CBC w Diff:  Lab Results  Component Value Date   WBC 6.5 12/20/2015   WBC 9.8 12/01/2014   HGB 10.4*  12/20/2015   HGB 9.4* 12/01/2014   HCT 31.3* 12/20/2015   HCT 29.9* 12/01/2014   PLT 112* 12/20/2015   PLT 96* 12/01/2014   LYMPHOPCT 3 12/15/2015   LYMPHOPCT 7.1 12/01/2014   MONOPCT 7 12/15/2015   MONOPCT 12.9 12/01/2014   EOSPCT 0 12/15/2015   EOSPCT 0.4 12/01/2014   BASOPCT 0 12/15/2015   BASOPCT 0.5 12/01/2014   CMP:  Lab Results  Component Value Date   NA 137 12/19/2015   NA 140 12/01/2014   K 3.6 12/19/2015   K 3.9 12/01/2014   CL 110 12/19/2015   CL 106 12/01/2014   CO2 20* 12/19/2015   CO2 24 12/01/2014   BUN 16 12/19/2015   BUN 25* 12/01/2014   CREATININE 0.98 12/20/2015   CREATININE 1.49* 12/02/2014   PROT 7.8 12/15/2015   PROT 6.9 11/30/2014   ALBUMIN 3.9 12/15/2015   ALBUMIN 3.4 11/30/2014   BILITOT 1.0 12/15/2015   BILITOT 0.6 11/30/2014   ALKPHOS 138* 12/15/2015   ALKPHOS 67 11/30/2014   AST 174* 12/15/2015   AST 14* 11/30/2014   ALT 51 12/15/2015   ALT 10* 11/30/2014  .  Micro Results Recent Results (from the past 240 hour(s))  Blood Culture (routine x 2)     Status: None (Preliminary result)   Collection Time: 12/15/15  4:56 PM  Result Value Ref Range Status   Specimen Description BLOOD RIGHT ASSIST CONTROL  Final   Special Requests   Final    BOTTLES DRAWN AEROBIC AND ANAEROBIC AERO 5CC ANA 2CC   Culture  Setup Time   Final    GRAM NEGATIVE RODS AEROBIC BOTTLE ONLY CRITICAL VALUE NOTED.  VALUE IS CONSISTENT WITH PREVIOUSLY REPORTED AND CALLED VALUE.    Culture ESCHERICHIA COLI AEROBIC BOTTLE ONLY   Final   Report Status PENDING  Incomplete   Organism ID, Bacteria ESCHERICHIA COLI  Final      Susceptibility   Escherichia coli - MIC*    AMPICILLIN <=2 SENSITIVE Sensitive     CEFTAZIDIME <=1 SENSITIVE Sensitive     CEFAZOLIN <=4 SENSITIVE Sensitive     CEFTRIAXONE <=1 SENSITIVE Sensitive     CIPROFLOXACIN <=0.25 SENSITIVE Sensitive     GENTAMICIN <=1 SENSITIVE Sensitive     IMIPENEM <=0.25 SENSITIVE Sensitive     TRIMETH/SULFA <=20  SENSITIVE Sensitive     PIP/TAZO Value in next row Sensitive      SENSITIVE<=4    AMPICILLIN/SULBACTAM Value in next row Sensitive      SENSITIVE<=2    * ESCHERICHIA COLI  Blood Culture (routine x 2)     Status: None   Collection Time: 12/15/15  4:57 PM  Result Value Ref Range Status   Specimen Description BLOOD LEFT ASSIST CONTROL  Final   Special Requests   Final    BOTTLES DRAWN AEROBIC AND ANAEROBIC 7CC AERO 5CC ANA   Culture  Setup Time   Final    GRAM NEGATIVE RODS IN BOTH AEROBIC AND ANAEROBIC BOTTLES CRITICAL RESULT CALLED TO, READ BACK BY AND VERIFIED WITH: CHRISTINE KATSOUDAS AT 0840 12/16/15 CTJ    Culture   Final    ESCHERICHIA COLI IN BOTH AEROBIC AND ANAEROBIC BOTTLES    Report Status 12/18/2015 FINAL  Final   Organism ID, Bacteria ESCHERICHIA COLI  Final      Susceptibility   Escherichia coli -  MIC*    AMPICILLIN <=2 SENSITIVE Sensitive     CEFTAZIDIME <=1 SENSITIVE Sensitive     CEFAZOLIN <=4 SENSITIVE Sensitive     CEFTRIAXONE <=1 SENSITIVE Sensitive     CIPROFLOXACIN <=0.25 SENSITIVE Sensitive     GENTAMICIN <=1 SENSITIVE Sensitive     IMIPENEM <=0.25 SENSITIVE Sensitive     TRIMETH/SULFA <=20 SENSITIVE Sensitive     PIP/TAZO Value in next row Sensitive      SENSITIVE<=4    AMPICILLIN/SULBACTAM Value in next row Sensitive      SENSITIVE<=2    * ESCHERICHIA COLI  Urine culture     Status: None   Collection Time: 12/15/15  4:57 PM  Result Value Ref Range Status   Specimen Description URINE, RANDOM  Final   Special Requests NONE  Final   Culture >=100,000 COLONIES/mL KLEBSIELLA PNEUMONIAE  Final   Report Status 12/17/2015 FINAL  Final   Organism ID, Bacteria KLEBSIELLA PNEUMONIAE  Final      Susceptibility   Klebsiella pneumoniae - MIC*    AMPICILLIN >=32 RESISTANT Resistant     CEFTAZIDIME <=1 SENSITIVE Sensitive     CEFAZOLIN <=4 SENSITIVE Sensitive     CEFTRIAXONE <=1 SENSITIVE Sensitive     CIPROFLOXACIN <=0.25 SENSITIVE Sensitive     GENTAMICIN  <=1 SENSITIVE Sensitive     IMIPENEM <=0.25 SENSITIVE Sensitive     TRIMETH/SULFA <=20 SENSITIVE Sensitive     NITROFURANTOIN Value in next row Resistant      RESISTANT256    PIP/TAZO Value in next row Sensitive      SENSITIVE8    AMPICILLIN/SULBACTAM Value in next row Sensitive      SENSITIVE4    * >=100,000 COLONIES/mL KLEBSIELLA PNEUMONIAE  Blood Culture ID Panel (Reflexed)     Status: Abnormal   Collection Time: 12/15/15  4:57 PM  Result Value Ref Range Status   Enterococcus species NOT DETECTED NOT DETECTED Final   Listeria monocytogenes NOT DETECTED NOT DETECTED Final   Staphylococcus species NOT DETECTED NOT DETECTED Final   Staphylococcus aureus NOT DETECTED NOT DETECTED Final   Streptococcus species NOT DETECTED NOT DETECTED Final   Streptococcus agalactiae NOT DETECTED NOT DETECTED Final   Streptococcus pneumoniae NOT DETECTED NOT DETECTED Final   Streptococcus pyogenes NOT DETECTED NOT DETECTED Final   Acinetobacter baumannii NOT DETECTED NOT DETECTED Final   Enterobacteriaceae species DETECTED (A) NOT DETECTED Final    Comment: CRITICAL RESULT CALLED TO, READ BACK BY AND VERIFIED WITH: CHRISTINE KATSOUDAS FOR E. COLI AND ENTEROBACTERIACEAE ON 12/16/15 AT 0840 CTJ    Enterobacter cloacae complex NOT DETECTED NOT DETECTED Final   Escherichia coli DETECTED (A) NOT DETECTED Final   Klebsiella oxytoca NOT DETECTED NOT DETECTED Final   Klebsiella pneumoniae NOT DETECTED NOT DETECTED Final   Proteus species NOT DETECTED NOT DETECTED Final   Serratia marcescens NOT DETECTED NOT DETECTED Final   Haemophilus influenzae NOT DETECTED NOT DETECTED Final   Neisseria meningitidis NOT DETECTED NOT DETECTED Final   Pseudomonas aeruginosa NOT DETECTED NOT DETECTED Final   Candida albicans NOT DETECTED NOT DETECTED Final   Candida glabrata NOT DETECTED NOT DETECTED Final   Candida krusei NOT DETECTED NOT DETECTED Final   Candida parapsilosis NOT DETECTED NOT DETECTED Final   Candida  tropicalis NOT DETECTED NOT DETECTED Final   Carbapenem resistance NOT DETECTED NOT DETECTED Final   Methicillin resistance NOT DETECTED NOT DETECTED Final   Vancomycin resistance NOT DETECTED NOT DETECTED Final  MRSA PCR Screening  Status: None   Collection Time: 12/15/15 10:35 PM  Result Value Ref Range Status   MRSA by PCR NEGATIVE NEGATIVE Final    Comment:        The GeneXpert MRSA Assay (FDA approved for NASAL specimens only), is one component of a comprehensive MRSA colonization surveillance program. It is not intended to diagnose MRSA infection nor to guide or monitor treatment for MRSA infections.         Code Status Orders        Start     Ordered   12/16/15 0804  Do not attempt resuscitation (DNR)   Continuous    Question Answer Comment  In the event of cardiac or respiratory ARREST Do not call a "code blue"   In the event of cardiac or respiratory ARREST Do not perform Intubation, CPR, defibrillation or ACLS   In the event of cardiac or respiratory ARREST Use medication by any route, position, wound care, and other measures to relive pain and suffering. May use oxygen, suction and manual treatment of airway obstruction as needed for comfort.      12/16/15 0803    Code Status History    Date Active Date Inactive Code Status Order ID Comments User Context   12/15/2015 10:27 PM 12/16/2015  8:03 AM Full Code [id number]  Altamese Dilling, MD Inpatient   12/15/2015  9:56 PM 12/15/2015 10:27 PM DNR [id number]  Altamese Dilling, MD ED   08/16/2015  6:13 PM 08/19/2015  3:21 PM DNR [id number]  Deeann Saint, MD Inpatient   08/13/2015  3:31 PM 08/16/2015  6:13 PM DNR [id number]  Houston Siren, MD ED    Advance Directive Documentation        Most Recent Value   Type of Advance Directive  Out of facility DNR (pink MOST or yellow form)   Pre-existing out of facility DNR order (yellow form or pink MOST form)  Physician notified to receive inpatient order   "MOST" Form  in Place?                Follow-up Information    Follow up with Margaretann Loveless, MD In 7 days.   Specialty:  Internal Medicine   Contact information:   [address] [place] [address] [phone]     Discharge Medications     Medication List    TAKE these medications        acetaminophen 325 MG tablet  Commonly known as:  TYLENOL  Take 650 mg by mouth every 4 (four) hours as needed for mild pain, fever or headache.     alendronate 70 MG tablet  Commonly known as:  FOSAMAX  Take 70 mg by mouth once a week. Pt takes on Sunday.   Take with a full glass of water on an empty stomach.     alum & mag hydroxide-simeth 400-400-40 MG/5ML suspension  Commonly known as:  MAALOX PLUS  Take 30 mLs by mouth every 6 (six) hours as needed for indigestion (reflux/gas).     bisacodyl 10 MG suppository  Commonly known as:  DULCOLAX  Place 10 mg rectally as needed for moderate constipation.     calcium carbonate 500 MG chewable tablet  Commonly known as:  TUMS - dosed in mg elemental calcium  Chew 1 tablet by mouth 2 (two) times daily.     calcium gluconate 500 MG tablet  Take 1 tablet (500 mg total) by mouth 2 (two) times daily.     cholecalciferol 1000  units tablet  Commonly known as:  VITAMIN D  Take 2,000 Units by mouth daily.     cyanocobalamin 1000 MCG/ML injection  Commonly known as:  (VITAMIN B-12)  Inject 1,000 mcg into the muscle every 30 (thirty) days. Pt uses on the first of every month.     donepezil 10 MG tablet  Commonly known as:  ARICEPT  Take 10 mg by mouth daily.     ELIQUIS 2.5 MG Tabs tablet  Generic drug:  apixaban  Take 2.5 mg by mouth every 12 (twelve) hours.     ertapenem 1 g in sodium chloride 0.9 % 50 mL  Inject 1 g into the vein daily. Via PICC line last dose on jan 30th     ferrous sulfate 325 (65 FE) MG tablet  Take 1 tablet (325 mg total) by mouth 2 (two) times daily with a meal.     gabapentin 300 MG capsule  Commonly known as:   NEURONTIN  Take 1 capsule (300 mg total) by mouth 3 (three) times daily.     metoprolol succinate 100 MG 24 hr tablet  Commonly known as:  TOPROL-XL  Take 150 mg by mouth daily.     mirtazapine 7.5 MG tablet  Commonly known as:  REMERON  Take 7.5 mg by mouth at bedtime.     oxybutynin 5 MG tablet  Commonly known as:  DITROPAN  Take 5 mg by mouth daily.     Oxycodone HCl 10 MG Tabs  Take 1 tablet (10 mg total) by mouth every 6 (six) hours as needed.     pantoprazole 40 MG tablet  Commonly known as:  PROTONIX  Take 40 mg by mouth daily.     potassium chloride SA 20 MEQ tablet  Commonly known as:  K-DUR,KLOR-CON  Take 20 mEq by mouth daily.     pravastatin 40 MG tablet  Commonly known as:  PRAVACHOL  Take 40 mg by mouth at bedtime.     torsemide 10 MG tablet  Commonly known as:  DEMADEX  Take 10 mg by mouth daily.       Total Time in preparing paper work, data evaluation and todays exam - 35 minutes  Hollye Pritt M.D on 12/20/2015 at 10:15 AM  [facility]   Office  [phone]

## 2015-12-19 NOTE — Progress Notes (Signed)
[facility] [place] [facility]   PATIENT NAME: Jill House    MR#:  [id number]  DATE OF BIRTH:  June 18, 1924  SUBJECTIVE:  More awake is not eating much denies any complaints.     REVIEW OF SYSTEMS:   Review of Systems   CONSTITUTIONAL: No documented fever. No fatigue, weakness. No weight gain, no weight loss.  EYES: No blurry or double vision.  ENT: No tinnitus. No postnasal drip. No redness of the oropharynx.  RESPIRATORY: No cough, no wheeze, no hemoptysis. No dyspnea.  CARDIOVASCULAR: No chest pain. No orthopnea. No palpitations. No syncope.  GASTROINTESTINAL: No nausea, no vomiting or diarrhea. No abdominal pain. No melena or hematochezia.  GENITOURINARY:  No urgency. No frequency. No dysuria. No hematuria. No obstructive symptoms. No discharge. No pain. No significant abnormal bleeding ENDOCRINE: No polyuria or nocturia. No heat or cold intolerance.  HEMATOLOGY: No anemia. No bruising. No bleeding. No purpura. No petechiae INTEGUMENTARY: No rashes. No lesions.  MUSCULOSKELETAL: No arthritis. No swelling. No gout.  NEUROLOGIC: No numbness, tingling, or ataxia. No seizure-type activity.  PSYCHIATRIC: No anxiety. No insomnia. No ADD.      DRUG ALLERGIES:   Allergies  Allergen Reactions  . Keflex [Cephalexin] Other (See Comments)    Reaction:  Tremors   . Ciprofloxacin Rash and Swelling    VITALS:  Blood pressure 144/89, pulse 113, temperature 98.1 F (36.7 C), temperature source Oral, resp. rate 18, height  (1.575 m), weight 67.2 kg (148 lb 2.4 oz), SpO2 96 %.  PHYSICAL EXAMINATION:   Physical Exam  GENERAL:  82 y.o.-year-old patient lying in the bed chronically ill-appearingl  EYES: Pupils equal, round, reactive to light and accommodation. No scleral icterus. Extraocular muscles intact.  HEENT: Head atraumatic, normocephalic. Oropharynx and nasopharynx clear.  NECK:  Supple, no jugular venous distention. No thyroid  enlargement, no tenderness.  LUNGS: Normal breath sounds bilaterally, no wheezing, rales, rhonchi. No use of accessory muscles of respiration.  CARDIOVASCULAR: Irregularly irregular. No murmurs, rubs, or gallops.  ABDOMEN: Soft, nontender, nondistended. Bowel sounds present. No organomegaly or mass.  EXTREMITIES: No cyanosis, clubbing or edema b/l.    NEUROLOGIC: Cranial nerves II through XII are intact. No focal Motor or sensory deficits b/l.   PSYCHIATRIC: The patient is alert and oriented x 3.  SKIN: No obvious rash, lesion, or ulcer.   LABORATORY PANEL:  CBC  Recent Labs Lab 12/19/15 0456  WBC 5.8  HGB 9.3*  HCT 28.2*  PLT 83*    Chemistries   Recent Labs Lab 12/15/15 1657  12/19/15 0456  NA 142  < > 137  K 4.5  < > 3.6  CL 103  < > 110  CO2 28  < > 20*  GLUCOSE 105*  < > 190*  BUN 40*  < > 16  CREATININE 1.87*  < > 0.96  CALCIUM 9.2  < > 6.9*  AST 174*  --   --   ALT 51  --   --   ALKPHOS 138*  --   --   BILITOT 1.0  --   --   < > = values in this interval not displayed.  Cardiac Enzymes  Recent Labs Lab 12/17/15 1124  TROPONINI 0.04*   RADIOLOGY:  Dg Chest 1 View  12/18/2015  CLINICAL DATA:  Shortness of breath, history atrial fibrillation, dementia, hypertension EXAM: CHEST 1 VIEW COMPARISON:  Portable exam 0539 hours compared to 12/17/2015 FINDINGS: RIGHT arm PICC line tip projects  over SVC repositioned since prior study. Enlargement of cardiac silhouette. Atherosclerotic calcification aorta. Pulmonary vascularity grossly normal. Slight prominent hila. BILATERAL perihilar and basilar infiltrates which could represent edema or infection, little changed. No gross pleural effusion or pneumothorax. Bones demineralized. IMPRESSION: Enlargement of cardiac silhouette. BILATERAL pulmonary infiltrates as above which could represent infection or edema. Electronically Signed   By: Ulyses Southward M.D.   On: 12/18/2015 07:34   Dg Chest 1 View  12/17/2015  CLINICAL DATA:   Status post PICC line adjustment. EXAM: CHEST 1 VIEW COMPARISON:  Earlier today at 1431 hours. FINDINGS: The right-sided PICC line has been repositioned, and currently terminates at the low SVC or cavoatrial junction. Difficult to visualize centrally. Midline trachea. Cardiomegaly accentuated by AP portable technique. Atherosclerosis in the transverse aorta. Small left pleural effusion. Minimal right hemidiaphragm elevation. No pneumothorax. Low lung volumes with resultant pulmonary interstitial prominence. Bibasilar airspace disease is similar. IMPRESSION: Right-sided PICC line terminating at the low SVC or cavoatrial junction. Cardiomegaly with persistent bibasilar airspace disease, favoring infection. Small left pleural effusion. Electronically Signed   By: Jeronimo Greaves M.D.   On: 12/17/2015 15:21   Dg Chest 1 View  12/17/2015  CLINICAL DATA:  82 year old female PICC line placement. Initial encounter. EXAM: CHEST 1 VIEW COMPARISON:  12/15/2015. FINDINGS: Portable AP upright view at 1433 hours. Right upper extremity approach PICC line has crossed midline via the innominate vein and the tip projects at the level of the left subclavian vein. Lower lung volumes with mildly increased patchy lung base opacity. Mild blunting of the left costophrenic angle. Stable cardiomegaly and mediastinal contours. No pneumothorax. IMPRESSION: 1. Right upper extremity approach PICC line crossed midline into the left innominate vein and terminates at the left subclavian vein level. This should be repositioned. 2. Increased lung base patchy opacity, with top considerations of atelectasis and infection. Possible small left pleural effusion. Electronically Signed   By: Odessa Fleming M.D.   On: 12/17/2015 14:43   ASSESSMENT AND PLAN:  Jansen Goodpasture is a 82 y.o. female with a known history of atrial fibrillation, dementia, hip joint replacement multiple times, knee joint replacement, osteoporosis, osteoarthritis, hypertension,  urinary incontinence, fracture of the TPR in September 2016 and after surgery sent to the rehabilitation, since then she could not recover much in physical activities and still living in rehabilitation Center.she came in the ER with fever up to 104, blood pressure was running low and she has tachycardia with atrial fibrillation heart rate running up to 130-140.  * Sepsis due to UTI Due to Escherichia coli and Klebsiella both sensitive to meropenem Continue IV meropenem  Allergy to cipro and keflex, can do meropenum im x 10 days  * Atrial fibrillation with rapid ventricular response.  Increase metoprolol dose Continue eliquisAppreciated Dr. Welton Flakes input   *Respiratory distress   Still little short of breath started on oral Lasix Add incentive spirometry   * Chronic pain She is on oxycodone and gabapentin at nursing home with very high doses  will decrease the dose but continue with small dose that she is dependent on them.  *Hyperlipidemia Continue statin.  * Urinary incontinence Continue oxybutynin.  *chronic dementia     Can discharge back to skilled nursing facility tomorrow CODE STATUS: DNR  DVT Prophylaxis: lovenox  TOTAL  TIME TAKING CARE OF THIS PATIENT:  35 minutes.      Note: This dictation was prepared with Dragon dictation along with smaller phrase technology. Any transcriptional errors that result from this  process are unintentional.  [name] M.D on 12/19/2015 at 11:10 AM  Between 7am to 6pm - Pager - [phone]  After 6pm go to [email or web address] - password [email or web address] [facility]  [facility]  Office  [phone]  CC: Primary care physician; Margaretann Loveless, MD

## 2015-12-19 NOTE — Progress Notes (Signed)
CSW faxed discharge information to [facility] to prepare for weekend discharge. [facility] admissions department has received patient's discharge paperwork. Patient is looking to discharge tomorrow (12/20/15) to [facility] via EMS.  CSW will continue to follow and assist.   Woodroe Mode, MSW, LCSW-A Clinical Social Work Department [phone]

## 2015-12-19 NOTE — Progress Notes (Addendum)
CSW was informed by MD Allena Katz that patient will discharge to [facility] tomorrow. CSW requested patient's discharge summary be completed today so that it may be faxed to [facility] before end of business today.   CSW spoke to patient and her niece, Jasper Loser at bedside. CSW informed patient and her niece that patient will discharge back to [facility] tomorrow. Patient's niece requested that patient be transported by [facility] transportation at discharge.   CSW contacted [facility] admissions department. Patient's room number will be [address] and Report number [phone].   CSW called [facility]'s admission department for a second time to determine if [facility] would provide transportation over the weekend for it's patients. CSW is awaiting a reply.   CSW received a reply from [facility] admissions reporting that [facility] does not provided transportation on the weekends for it's patients. CSW called patient's room and spoke to Niece. Informed Niece that [facility]'s transportation would not run this weekend. Patient's niece is agreeable to EMS for transport tomorrow to [place].   CSW will continue to follow and assist.   Woodroe Mode, MSW, LCSW-A Clinical Social Work Department [phone]

## 2015-12-20 LAB — CREATININE, SERUM
CREATININE: 0.98 mg/dL (ref 0.44–1.00)
GFR, EST AFRICAN AMERICAN: 58 mL/min — AB (ref >60)
GFR, EST NON AFRICAN AMERICAN: 50 mL/min — AB (ref >60)

## 2015-12-20 LAB — CBC
HCT: 31.3 % — ABNORMAL LOW (ref 35.0–47.0)
Hemoglobin: 10.4 g/dL — ABNORMAL LOW (ref 12.0–16.0)
MCH: 31.4 pg (ref 26.0–34.0)
MCHC: 33.3 g/dL (ref 32.0–36.0)
MCV: 94.1 fL (ref 80.0–100.0)
PLATELETS: 112 10*3/uL — AB (ref 150–440)
RBC: 3.33 MIL/uL — AB (ref 3.80–5.20)
RDW: 14.9 % — AB (ref 11.5–14.5)
WBC: 6.5 10*3/uL (ref 3.6–11.0)

## 2015-12-20 MED ORDER — SODIUM CHLORIDE 0.9 % IV SOLN: 1.0000 g | INTRAVENOUS | Status: AC

## 2015-12-20 NOTE — Progress Notes (Signed)
SUBJECTIVE: Feeling much better   Filed Vitals:   12/19/15 2301 12/20/15 0434 12/20/15 0825 12/20/15 1111  BP: 152/81 142/91 148/84 104/78  Pulse: 70 73 67 60  Temp:  97.9 F (36.6 C) 97.7 F (36.5 C) 97.8 F (36.6 C)  TempSrc:  Oral Oral Oral  Resp:  Height:      Weight:      SpO2:  95% 95% 94%    Intake/Output Summary (Last 24 hours) at 12/20/15 1146 Last data filed at 12/20/15 1002  Gross per 24 hour  Intake      0 ml  Output      0 ml  Net      0 ml    LABS: Basic Metabolic Panel:  Recent Labs  16/10/96 0456 12/20/15 0445  NA 137  --   K 3.6  --   CL 110  --   CO2 20*  --   GLUCOSE 190*  --   BUN 16  --   CREATININE 0.96 0.98  CALCIUM 6.9*  --    Liver Function Tests: No results for input(s): AST, ALT, ALKPHOS, BILITOT, PROT, ALBUMIN in the last 72 hours. No results for input(s): LIPASE, AMYLASE in the last 72 hours. CBC:  Recent Labs  12/19/15 0456 12/20/15 0445  WBC 5.8 6.5  HGB 9.3* 10.4*  HCT 28.2* 31.3*  MCV 92.6 94.1  PLT 83* 112*   Cardiac Enzymes: No results for input(s): CKTOTAL, CKMB, CKMBINDEX, TROPONINI in the last 72 hours. BNP: Invalid input(s): POCBNP D-Dimer: No results for input(s): DDIMER in the last 72 hours. Hemoglobin A1C: No results for input(s): HGBA1C in the last 72 hours. Fasting Lipid Panel: No results for input(s): CHOL, HDL, LDLCALC, TRIG, CHOLHDL, LDLDIRECT in the last 72 hours. Thyroid Function Tests: No results for input(s): TSH, T4TOTAL, T3FREE, THYROIDAB in the last 72 hours.  Invalid input(s): FREET3 Anemia Panel: No results for input(s): VITAMINB12, FOLATE, FERRITIN, TIBC, IRON, RETICCTPCT in the last 72 hours.   PHYSICAL EXAM General: Well developed, well nourished, in no acute distress HEENT:  Normocephalic and atramatic Neck:  No JVD.  Lungs: Clear bilaterally to auscultation and percussion. Heart: HRRR . Normal S1 and S2 without gallops or murmurs.  Abdomen: Bowel sounds are positive,  abdomen soft and non-tender  Msk:  House normal, normal gait. Normal strength and tone for age. Extremities: No clubbing, cyanosis or edema.   Neuro: Alert and oriented X 3. Psych:  Good affect, responds appropriately  TELEMETRY: A. fib with controlled ventricular response rate about 70/m  ASSESSMENT AND PLAN: A. fib with controlled ventricular response rate on metoprolol. Continue metoprolol and aspirin with follow-up in the office.  Principal Problem:   Sepsis (HCC) Active Problems:   UTI (lower urinary tract infection)   Malnutrition of moderate degree    Jill House A, MD, [facility] 12/20/2015 11:46 AM

## 2015-12-20 NOTE — Progress Notes (Signed)
Clinical Social Worker informed by  Enedina Finner, MD that patient is medically ready to discharge back to SNF, Patient and Niece  Maralyn Sago are in a agreement with plan.  [facility] provided patient's room number and number to call for report . All discharge information faxed to  Facility. Rx's and DNR added to discharge packet.   RN will call report and patient will discharge to [facility] via EMS.  Sammuel Hines. LCSWA Clinical Social Work Department [phone] 11:58 AM

## 2015-12-20 NOTE — Clinical Social Work Placement (Signed)
   CLINICAL SOCIAL WORK PLACEMENT  NOTE  Date:  12/20/2015  Patient Details  Name: Jill House MRN: [id number] Date of Birth: January 18, 1924  Clinical Social Work is seeking post-discharge placement for this patient at the Skilled  Nursing Facility level of care (*CSW will initial, date and re-position this form in  chart as items are completed):  Yes   Patient/family provided with [facility] Clinical Social Work Department's list of facilities offering this level of care within the geographic area requested by the patient (or if unable, by the patient's family).  Yes   Patient/family informed of their freedom to choose among providers that offer the needed level of care, that participate in Medicare, Medicaid or managed care program needed by the patient, have an available bed and are willing to accept the patient.  Yes   Patient/family informed of [facility] [place] [facility]'s ownership interest in [facility] and [facility], as well as of the fact that they are under no obligation to receive care at these facilities.  PASRR submitted to EDS on       PASRR number received on       Existing PASRR number confirmed on 12/20/15     FL2 transmitted to all facilities in geographic area requested by pt/family on 12/19/15     FL2 transmitted to all facilities within larger geographic area on       Patient informed that his/her managed care company has contracts with or will negotiate with certain facilities, including the following:        Yes   Patient/family informed of bed offers received.  Patient chooses bed at  [facility])     Physician recommends and patient chooses bed at      Patient to be transferred to  [facility]) on 12/20/15.  Patient to be transferred to facility by  (EMS)     Patient family notified on 12/20/15 of transfer.  Name of family member notified:   (niece Maralyn Sago)     PHYSICIAN       Additional Comment:     _______________________________________________ Soundra Pilon, LCSW 12/20/2015, 11:55 AM

## 2015-12-21 LAB — CULTURE, BLOOD (ROUTINE X 2)

## 2015-12-22 DIAGNOSIS — E876 Hypokalemia: Secondary | ICD-10-CM | POA: Diagnosis present

## 2015-12-22 LAB — CBC WITH DIFFERENTIAL/PLATELET
BASOS ABS: 0.1 10*3/uL (ref 0–0.1)
Basophils Relative: 1 %
EOS PCT: 4 %
Eosinophils Absolute: 0.2 10*3/uL (ref 0–0.7)
HEMATOCRIT: 31.1 % — AB (ref 35.0–47.0)
Hemoglobin: 10.5 g/dL — ABNORMAL LOW (ref 12.0–16.0)
LYMPHS PCT: 14 %
Lymphs Abs: 0.9 10*3/uL — ABNORMAL LOW (ref 1.0–3.6)
MCH: 31.2 pg (ref 26.0–34.0)
MCHC: 33.8 g/dL (ref 32.0–36.0)
MCV: 92.1 fL (ref 80.0–100.0)
MONO ABS: 0.9 10*3/uL (ref 0.2–0.9)
MONOS PCT: 14 %
NEUTROS ABS: 4.4 10*3/uL (ref 1.4–6.5)
Neutrophils Relative %: 67 %
PLATELETS: 130 10*3/uL — AB (ref 150–440)
RBC: 3.38 MIL/uL — ABNORMAL LOW (ref 3.80–5.20)
RDW: 14.4 % (ref 11.5–14.5)
WBC: 6.5 10*3/uL (ref 3.6–11.0)

## 2015-12-22 LAB — COMPREHENSIVE METABOLIC PANEL
ALT: 37 U/L (ref 14–54)
ANION GAP: 7 (ref 5–15)
AST: 23 U/L (ref 15–41)
Albumin: 2.6 g/dL — ABNORMAL LOW (ref 3.5–5.0)
Alkaline Phosphatase: 138 U/L — ABNORMAL HIGH (ref 38–126)
BILIRUBIN TOTAL: 0.6 mg/dL (ref 0.3–1.2)
BUN: 12 mg/dL (ref 6–20)
CO2: 33 mmol/L — AB (ref 22–32)
Calcium: 8.7 mg/dL — ABNORMAL LOW (ref 8.9–10.3)
Chloride: 104 mmol/L (ref 101–111)
Creatinine, Ser: 0.87 mg/dL (ref 0.44–1.00)
GFR, EST NON AFRICAN AMERICAN: 57 mL/min — AB (ref >60)
GLUCOSE: 97 mg/dL (ref 65–99)
POTASSIUM: 2.8 mmol/L — AB (ref 3.5–5.1)
Sodium: 144 mmol/L (ref 135–145)
TOTAL PROTEIN: 5.4 g/dL — AB (ref 6.5–8.1)

## 2015-12-23 DIAGNOSIS — E876 Hypokalemia: Secondary | ICD-10-CM | POA: Diagnosis not present

## 2015-12-23 LAB — BASIC METABOLIC PANEL
ANION GAP: 6 (ref 5–15)
BUN: 10 mg/dL (ref 6–20)
CALCIUM: 9 mg/dL (ref 8.9–10.3)
CHLORIDE: 105 mmol/L (ref 101–111)
CO2: 33 mmol/L — AB (ref 22–32)
Creatinine, Ser: 0.91 mg/dL (ref 0.44–1.00)
GFR calc non Af Amer: 54 mL/min — ABNORMAL LOW (ref >60)
GLUCOSE: 76 mg/dL (ref 65–99)
POTASSIUM: 4.4 mmol/L (ref 3.5–5.1)
Sodium: 144 mmol/L (ref 135–145)

## 2015-12-30 DIAGNOSIS — E876 Hypokalemia: Secondary | ICD-10-CM | POA: Diagnosis not present

## 2015-12-30 LAB — URINALYSIS COMPLETE WITH MICROSCOPIC (ARMC ONLY)
BILIRUBIN URINE: NEGATIVE
Bacteria, UA: NONE SEEN
Glucose, UA: NEGATIVE mg/dL
KETONES UR: NEGATIVE mg/dL
NITRITE: NEGATIVE
PH: 7 (ref 5.0–8.0)
Protein, ur: NEGATIVE mg/dL
Specific Gravity, Urine: 1.011 (ref 1.005–1.030)

## 2015-12-31 ENCOUNTER — Encounter
Admission: RE | Admit: 2015-12-31 | Discharge: 2015-12-31 | Disposition: A | Discharge status: Home / Self Care | Payer: Medicare Other | Source: Ambulatory Visit | Attending: Internal Medicine | Admitting: Internal Medicine

## 2015-12-31 DIAGNOSIS — R8299 Other abnormal findings in urine: Secondary | ICD-10-CM | POA: Insufficient documentation

## 2015-12-31 DIAGNOSIS — R350 Frequency of micturition: Secondary | ICD-10-CM | POA: Insufficient documentation

## 2016-01-01 DIAGNOSIS — R8299 Other abnormal findings in urine: Secondary | ICD-10-CM | POA: Diagnosis present

## 2016-01-01 DIAGNOSIS — R350 Frequency of micturition: Secondary | ICD-10-CM | POA: Diagnosis present

## 2016-01-01 LAB — COMPREHENSIVE METABOLIC PANEL
ALK PHOS: 90 U/L (ref 38–126)
ALT: 10 U/L — AB (ref 14–54)
ANION GAP: 3 — AB (ref 5–15)
AST: 17 U/L (ref 15–41)
Albumin: 2.9 g/dL — ABNORMAL LOW (ref 3.5–5.0)
BUN: 26 mg/dL — ABNORMAL HIGH (ref 6–20)
CALCIUM: 8.6 mg/dL — AB (ref 8.9–10.3)
CO2: 34 mmol/L — AB (ref 22–32)
CREATININE: 1.53 mg/dL — AB (ref 0.44–1.00)
Chloride: 104 mmol/L (ref 101–111)
GFR, EST AFRICAN AMERICAN: 34 mL/min — AB (ref >60)
GFR, EST NON AFRICAN AMERICAN: 29 mL/min — AB (ref >60)
Glucose, Bld: 106 mg/dL — ABNORMAL HIGH (ref 65–99)
Potassium: 4.2 mmol/L (ref 3.5–5.1)
SODIUM: 141 mmol/L (ref 135–145)
Total Bilirubin: 0.8 mg/dL (ref 0.3–1.2)
Total Protein: 6.2 g/dL — ABNORMAL LOW (ref 6.5–8.1)

## 2016-01-01 LAB — CBC WITH DIFFERENTIAL/PLATELET
Basophils Absolute: 0.1 10*3/uL (ref 0–0.1)
Basophils Relative: 2 %
EOS ABS: 0.1 10*3/uL (ref 0–0.7)
EOS PCT: 4 %
HCT: 30.9 % — ABNORMAL LOW (ref 35.0–47.0)
HEMOGLOBIN: 10.2 g/dL — AB (ref 12.0–16.0)
LYMPHS ABS: 0.7 10*3/uL — AB (ref 1.0–3.6)
LYMPHS PCT: 20 %
MCH: 30.9 pg (ref 26.0–34.0)
MCHC: 32.9 g/dL (ref 32.0–36.0)
MCV: 94 fL (ref 80.0–100.0)
MONOS PCT: 14 %
Monocytes Absolute: 0.5 10*3/uL (ref 0.2–0.9)
Neutro Abs: 2.2 10*3/uL (ref 1.4–6.5)
Neutrophils Relative %: 60 %
PLATELETS: 128 10*3/uL — AB (ref 150–440)
RBC: 3.28 MIL/uL — ABNORMAL LOW (ref 3.80–5.20)
RDW: 14.3 % (ref 11.5–14.5)
WBC: 3.7 10*3/uL (ref 3.6–11.0)

## 2016-01-01 LAB — VITAMIN B12

## 2016-01-01 LAB — FOLATE: Folate: 7.7 ng/mL (ref >5.9)

## 2016-01-01 LAB — URINE CULTURE: Culture: 5000

## 2016-01-01 LAB — TSH: TSH: 5.358 u[IU]/mL — AB (ref 0.350–4.500)

## 2016-01-01 LAB — MAGNESIUM: MAGNESIUM: 2.5 mg/dL — AB (ref 1.7–2.4)

## 2016-01-01 LAB — APTT: aPTT: 39 seconds — ABNORMAL HIGH (ref 24–36)

## 2016-01-06 DIAGNOSIS — R350 Frequency of micturition: Secondary | ICD-10-CM | POA: Diagnosis not present

## 2016-01-06 LAB — CBC WITH DIFFERENTIAL/PLATELET
BASOS ABS: 0 10*3/uL (ref 0–0.1)
Basophils Relative: 1 %
Eosinophils Absolute: 0.2 10*3/uL (ref 0–0.7)
Eosinophils Relative: 6 %
HEMATOCRIT: 30.5 % — AB (ref 35.0–47.0)
Hemoglobin: 10 g/dL — ABNORMAL LOW (ref 12.0–16.0)
LYMPHS ABS: 0.8 10*3/uL — AB (ref 1.0–3.6)
LYMPHS PCT: 24 %
MCH: 30.3 pg (ref 26.0–34.0)
MCHC: 32.7 g/dL (ref 32.0–36.0)
MCV: 92.8 fL (ref 80.0–100.0)
MONO ABS: 0.4 10*3/uL (ref 0.2–0.9)
Monocytes Relative: 12 %
NEUTROS ABS: 1.8 10*3/uL (ref 1.4–6.5)
Neutrophils Relative %: 57 %
Platelets: 127 10*3/uL — ABNORMAL LOW (ref 150–440)
RBC: 3.29 MIL/uL — ABNORMAL LOW (ref 3.80–5.20)
RDW: 14.8 % — AB (ref 11.5–14.5)
WBC: 3.2 10*3/uL — ABNORMAL LOW (ref 3.6–11.0)

## 2016-01-06 LAB — COMPREHENSIVE METABOLIC PANEL
ALT: 9 U/L — AB (ref 14–54)
AST: 19 U/L (ref 15–41)
Albumin: 2.9 g/dL — ABNORMAL LOW (ref 3.5–5.0)
Alkaline Phosphatase: 78 U/L (ref 38–126)
Anion gap: 5 (ref 5–15)
BILIRUBIN TOTAL: 0.7 mg/dL (ref 0.3–1.2)
BUN: 23 mg/dL — AB (ref 6–20)
CO2: 30 mmol/L (ref 22–32)
CREATININE: 1.17 mg/dL — AB (ref 0.44–1.00)
Calcium: 8.6 mg/dL — ABNORMAL LOW (ref 8.9–10.3)
Chloride: 110 mmol/L (ref 101–111)
GFR calc Af Amer: 46 mL/min — ABNORMAL LOW (ref >60)
GFR, EST NON AFRICAN AMERICAN: 40 mL/min — AB (ref >60)
Glucose, Bld: 75 mg/dL (ref 65–99)
POTASSIUM: 4.1 mmol/L (ref 3.5–5.1)
Sodium: 145 mmol/L (ref 135–145)
TOTAL PROTEIN: 5.9 g/dL — AB (ref 6.5–8.1)

## 2016-01-06 LAB — URINALYSIS COMPLETE WITH MICROSCOPIC (ARMC ONLY)
BILIRUBIN URINE: NEGATIVE
Bacteria, UA: NONE SEEN
Glucose, UA: NEGATIVE mg/dL
Hgb urine dipstick: NEGATIVE
KETONES UR: NEGATIVE mg/dL
Leukocytes, UA: NEGATIVE
Nitrite: NEGATIVE
Protein, ur: NEGATIVE mg/dL
Specific Gravity, Urine: 1.02 (ref 1.005–1.030)
pH: 6 (ref 5.0–8.0)

## 2016-01-06 LAB — VITAMIN B12: VITAMIN B 12: 957 pg/mL — AB (ref 180–914)

## 2016-01-07 LAB — URINE CULTURE: CULTURE: NO GROWTH

## 2016-01-09 ENCOUNTER — Emergency Department: Payer: Medicare Other

## 2016-01-09 ENCOUNTER — Inpatient Hospital Stay: Payer: Medicare Other

## 2016-01-09 ENCOUNTER — Encounter: Payer: Self-pay | Admitting: Emergency Medicine

## 2016-01-09 ENCOUNTER — Inpatient Hospital Stay
Admission: EM | Admit: 2016-01-09 | Discharge: 2016-01-14 | DRG: 871 | Disposition: A | Discharge status: Skilled Nursing Facility | Payer: Medicare Other | Attending: Internal Medicine | Admitting: Internal Medicine

## 2016-01-09 DIAGNOSIS — N179 Acute kidney failure, unspecified: Secondary | ICD-10-CM | POA: Diagnosis present

## 2016-01-09 DIAGNOSIS — R6520 Severe sepsis without septic shock: Secondary | ICD-10-CM

## 2016-01-09 DIAGNOSIS — I2510 Atherosclerotic heart disease of native coronary artery without angina pectoris: Secondary | ICD-10-CM | POA: Diagnosis present

## 2016-01-09 DIAGNOSIS — A419 Sepsis, unspecified organism: Principal | ICD-10-CM | POA: Diagnosis present

## 2016-01-09 DIAGNOSIS — I482 Chronic atrial fibrillation: Secondary | ICD-10-CM | POA: Diagnosis present

## 2016-01-09 DIAGNOSIS — F0390 Unspecified dementia without behavioral disturbance: Secondary | ICD-10-CM | POA: Diagnosis present

## 2016-01-09 DIAGNOSIS — E8770 Fluid overload, unspecified: Secondary | ICD-10-CM | POA: Diagnosis present

## 2016-01-09 DIAGNOSIS — J811 Chronic pulmonary edema: Secondary | ICD-10-CM | POA: Diagnosis present

## 2016-01-09 DIAGNOSIS — I700 Atherosclerosis of aorta: Secondary | ICD-10-CM | POA: Diagnosis present

## 2016-01-09 DIAGNOSIS — L8990 Pressure ulcer of unspecified site, unspecified stage: Secondary | ICD-10-CM | POA: Insufficient documentation

## 2016-01-09 DIAGNOSIS — I10 Essential (primary) hypertension: Secondary | ICD-10-CM | POA: Diagnosis present

## 2016-01-09 DIAGNOSIS — Z8249 Family history of ischemic heart disease and other diseases of the circulatory system: Secondary | ICD-10-CM

## 2016-01-09 DIAGNOSIS — R6521 Severe sepsis with septic shock: Secondary | ICD-10-CM | POA: Diagnosis present

## 2016-01-09 DIAGNOSIS — E876 Hypokalemia: Secondary | ICD-10-CM | POA: Diagnosis present

## 2016-01-09 DIAGNOSIS — B9789 Other viral agents as the cause of diseases classified elsewhere: Secondary | ICD-10-CM | POA: Diagnosis present

## 2016-01-09 DIAGNOSIS — N390 Urinary tract infection, site not specified: Secondary | ICD-10-CM | POA: Diagnosis present

## 2016-01-09 DIAGNOSIS — Z66 Do not resuscitate: Secondary | ICD-10-CM | POA: Diagnosis present

## 2016-01-09 DIAGNOSIS — I4892 Unspecified atrial flutter: Secondary | ICD-10-CM | POA: Diagnosis present

## 2016-01-09 DIAGNOSIS — G9341 Metabolic encephalopathy: Secondary | ICD-10-CM | POA: Diagnosis present

## 2016-01-09 DIAGNOSIS — Z95828 Presence of other vascular implants and grafts: Secondary | ICD-10-CM

## 2016-01-09 DIAGNOSIS — I959 Hypotension, unspecified: Secondary | ICD-10-CM

## 2016-01-09 DIAGNOSIS — R509 Fever, unspecified: Secondary | ICD-10-CM

## 2016-01-09 DIAGNOSIS — R0602 Shortness of breath: Secondary | ICD-10-CM

## 2016-01-09 LAB — BLOOD GAS, VENOUS
ACID-BASE EXCESS: 1.2 mmol/L (ref 0.0–3.0)
BICARBONATE: 28.7 meq/L — AB (ref 21.0–28.0)
FIO2: 28
PH VEN: 7.31 — AB (ref 7.320–7.430)
Patient temperature: 37
pCO2, Ven: 57 mmHg (ref 44.0–60.0)
pO2, Ven: <31 mmHg (ref 30.0–45.0)

## 2016-01-09 LAB — URINALYSIS COMPLETE WITH MICROSCOPIC (ARMC ONLY)
Bacteria, UA: NONE SEEN
Bilirubin Urine: NEGATIVE
Glucose, UA: NEGATIVE mg/dL
Ketones, ur: NEGATIVE mg/dL
Nitrite: NEGATIVE
PH: 5 (ref 5.0–8.0)
PROTEIN: 100 mg/dL — AB
Specific Gravity, Urine: 1.025 (ref 1.005–1.030)

## 2016-01-09 LAB — COMPREHENSIVE METABOLIC PANEL
ALBUMIN: 3.3 g/dL — AB (ref 3.5–5.0)
ALT: 72 U/L — ABNORMAL HIGH (ref 14–54)
AST: 235 U/L — AB (ref 15–41)
Alkaline Phosphatase: 172 U/L — ABNORMAL HIGH (ref 38–126)
Anion gap: 7 (ref 5–15)
BILIRUBIN TOTAL: 1.4 mg/dL — AB (ref 0.3–1.2)
BUN: 31 mg/dL — AB (ref 6–20)
CO2: 27 mmol/L (ref 22–32)
Calcium: 8.9 mg/dL (ref 8.9–10.3)
Chloride: 112 mmol/L — ABNORMAL HIGH (ref 101–111)
Creatinine, Ser: 1.76 mg/dL — ABNORMAL HIGH (ref 0.44–1.00)
GFR calc Af Amer: 28 mL/min — ABNORMAL LOW (ref >60)
GFR calc non Af Amer: 24 mL/min — ABNORMAL LOW (ref >60)
GLUCOSE: 105 mg/dL — AB (ref 65–99)
POTASSIUM: 5.1 mmol/L (ref 3.5–5.1)
SODIUM: 146 mmol/L — AB (ref 135–145)
TOTAL PROTEIN: 6.9 g/dL (ref 6.5–8.1)

## 2016-01-09 LAB — CBC WITH DIFFERENTIAL/PLATELET
BASOS PCT: 1 %
Basophils Absolute: 0 10*3/uL (ref 0–0.1)
Eosinophils Absolute: 0 10*3/uL (ref 0–0.7)
Eosinophils Relative: 0 %
HEMATOCRIT: 34.7 % — AB (ref 35.0–47.0)
HEMOGLOBIN: 11.2 g/dL — AB (ref 12.0–16.0)
LYMPHS ABS: 0.3 10*3/uL — AB (ref 1.0–3.6)
LYMPHS PCT: 3 %
MCH: 30.6 pg (ref 26.0–34.0)
MCHC: 32.2 g/dL (ref 32.0–36.0)
MCV: 94.9 fL (ref 80.0–100.0)
MONO ABS: 0.6 10*3/uL (ref 0.2–0.9)
MONOS PCT: 7 %
NEUTROS ABS: 8 10*3/uL — AB (ref 1.4–6.5)
Neutrophils Relative %: 89 %
Platelets: 121 10*3/uL — ABNORMAL LOW (ref 150–440)
RBC: 3.66 MIL/uL — ABNORMAL LOW (ref 3.80–5.20)
RDW: 15.5 % — AB (ref 11.5–14.5)
WBC: 8.9 10*3/uL (ref 3.6–11.0)

## 2016-01-09 LAB — BLOOD GAS, ARTERIAL
ACID-BASE DEFICIT: 1.3 mmol/L (ref 0.0–2.0)
ALLENS TEST (PASS/FAIL): POSITIVE — AB
Bicarbonate: 24.3 mEq/L (ref 21.0–28.0)
FIO2: 32
O2 Saturation: 99.8 %
PCO2 ART: 43 mmHg (ref 32.0–48.0)
PH ART: 7.36 (ref 7.350–7.450)
Patient temperature: 37
pO2, Arterial: 220 mmHg — ABNORMAL HIGH (ref 83.0–108.0)

## 2016-01-09 LAB — LACTIC ACID, PLASMA
LACTIC ACID, VENOUS: 1.2 mmol/L (ref 0.5–2.0)
Lactic Acid, Venous: 1.7 mmol/L (ref 0.5–2.0)

## 2016-01-09 LAB — PROTIME-INR
INR: 1.58
Prothrombin Time: 18.9 seconds — ABNORMAL HIGH (ref 11.4–15.0)

## 2016-01-09 LAB — MRSA PCR SCREENING: MRSA by PCR: NEGATIVE

## 2016-01-09 LAB — TROPONIN I: Troponin I: 0.03 ng/mL (ref <0.031)

## 2016-01-09 LAB — LIPASE, BLOOD: Lipase: 28 U/L (ref 11–51)

## 2016-01-09 MED ORDER — OXYBUTYNIN CHLORIDE 5 MG PO TABS
2.5000 mg | ORAL_TABLET | Freq: Three times a day (TID) | ORAL | Status: DC
  Administered 2016-01-10 – 2016-01-14 (×13): 2.5 mg via ORAL
  Filled 2016-01-09 (×13): qty 1

## 2016-01-09 MED ORDER — SODIUM CHLORIDE 0.9 % IV BOLUS (SEPSIS)
500.0000 mL | INTRAVENOUS | Status: AC
  Administered 2016-01-09: 500 mL via INTRAVENOUS

## 2016-01-09 MED ORDER — ACETAMINOPHEN 650 MG RE SUPP
650.0000 mg | Freq: Once | RECTAL | Status: AC
  Administered 2016-01-09: 650 mg via RECTAL
  Filled 2016-01-09: qty 1

## 2016-01-09 MED ORDER — PANTOPRAZOLE SODIUM 40 MG PO TBEC
40.0000 mg | DELAYED_RELEASE_TABLET | Freq: Every day | ORAL | Status: DC
  Administered 2016-01-10 – 2016-01-14 (×5): 40 mg via ORAL
  Filled 2016-01-09 (×5): qty 1

## 2016-01-09 MED ORDER — DONEPEZIL HCL 5 MG PO TABS
10.0000 mg | ORAL_TABLET | Freq: Every day | ORAL | Status: DC
  Administered 2016-01-10 – 2016-01-14 (×5): 10 mg via ORAL
  Filled 2016-01-09 (×5): qty 2

## 2016-01-09 MED ORDER — PRAVASTATIN SODIUM 40 MG PO TABS
40.0000 mg | ORAL_TABLET | Freq: Every day | ORAL | Status: DC
  Administered 2016-01-10 – 2016-01-13 (×4): 40 mg via ORAL
  Filled 2016-01-09 (×2): qty 2
  Filled 2016-01-09 (×2): qty 1

## 2016-01-09 MED ORDER — ALBUTEROL SULFATE (2.5 MG/3ML) 0.083% IN NEBU
2.5000 mg | INHALATION_SOLUTION | RESPIRATORY_TRACT | Status: DC | PRN
  Administered 2016-01-10: 2.5 mg via RESPIRATORY_TRACT
  Filled 2016-01-09: qty 3

## 2016-01-09 MED ORDER — SODIUM CHLORIDE 0.9 % IV BOLUS (SEPSIS): 500.0000 mL | Freq: Once | INTRAVENOUS | Status: DC

## 2016-01-09 MED ORDER — SODIUM CHLORIDE 0.9 % IV SOLN
INTRAVENOUS | Status: DC
  Administered 2016-01-09 – 2016-01-10 (×3): via INTRAVENOUS

## 2016-01-09 MED ORDER — VANCOMYCIN HCL IN DEXTROSE 1-5 GM/200ML-% IV SOLN
1000.0000 mg | Freq: Once | INTRAVENOUS | Status: AC
  Administered 2016-01-09: 1000 mg via INTRAVENOUS
  Filled 2016-01-09: qty 200

## 2016-01-09 MED ORDER — PIPERACILLIN-TAZOBACTAM 3.375 G IVPB
3.3750 g | Freq: Two times a day (BID) | INTRAVENOUS | Status: DC
  Administered 2016-01-09 (×2): 3.375 g via INTRAVENOUS
  Filled 2016-01-09 (×4): qty 50

## 2016-01-09 MED ORDER — ACETAMINOPHEN 325 MG PO TABS: 650.0000 mg | ORAL_TABLET | Freq: Four times a day (QID) | ORAL | Status: DC | PRN

## 2016-01-09 MED ORDER — FERROUS SULFATE 325 (65 FE) MG PO TABS
325.0000 mg | ORAL_TABLET | Freq: Two times a day (BID) | ORAL | Status: DC
  Administered 2016-01-10 – 2016-01-14 (×9): 325 mg via ORAL
  Filled 2016-01-09 (×9): qty 1

## 2016-01-09 MED ORDER — MIRTAZAPINE 15 MG PO TABS
7.5000 mg | ORAL_TABLET | Freq: Every day | ORAL | Status: DC
  Administered 2016-01-10 – 2016-01-13 (×4): 7.5 mg via ORAL
  Filled 2016-01-09 (×4): qty 1

## 2016-01-09 MED ORDER — MEMANTINE HCL ER 14 MG PO CP24
28.0000 mg | ORAL_CAPSULE | Freq: Every day | ORAL | Status: DC
  Administered 2016-01-10 – 2016-01-14 (×5): 28 mg via ORAL
  Filled 2016-01-09 (×4): qty 2
  Filled 2016-01-09: qty 1
  Filled 2016-01-09 (×2): qty 2

## 2016-01-09 MED ORDER — CALCIUM CARBONATE ANTACID 500 MG PO CHEW
1.0000 | CHEWABLE_TABLET | Freq: Two times a day (BID) | ORAL | Status: DC
  Administered 2016-01-10 – 2016-01-14 (×9): 200 mg via ORAL
  Filled 2016-01-09 (×9): qty 1

## 2016-01-09 MED ORDER — DEXTROSE 5 % IV SOLN
2.0000 g | Freq: Once | INTRAVENOUS | Status: AC
  Administered 2016-01-09: 2 g via INTRAVENOUS
  Filled 2016-01-09: qty 2

## 2016-01-09 MED ORDER — SODIUM CHLORIDE 0.9 % IV BOLUS (SEPSIS)
500.0000 mL | Freq: Once | INTRAVENOUS | Status: AC
  Administered 2016-01-09: 500 mL via INTRAVENOUS

## 2016-01-09 MED ORDER — ACETAMINOPHEN 650 MG RE SUPP: 650.0000 mg | Freq: Four times a day (QID) | RECTAL | Status: DC | PRN

## 2016-01-09 MED ORDER — NOREPINEPHRINE BITARTRATE 1 MG/ML IV SOLN
0.0000 ug/min | INTRAVENOUS | Status: DC
  Administered 2016-01-09: 6 ug/min via INTRAVENOUS
  Administered 2016-01-10: 3 ug/min via INTRAVENOUS
  Administered 2016-01-10: 2 ug/min via INTRAVENOUS
  Administered 2016-01-11: 4 ug/min via INTRAVENOUS
  Filled 2016-01-09 (×2): qty 4

## 2016-01-09 MED ORDER — VANCOMYCIN HCL IN DEXTROSE 750-5 MG/150ML-% IV SOLN
750.0000 mg | INTRAVENOUS | Status: DC
  Administered 2016-01-10: 750 mg via INTRAVENOUS
  Filled 2016-01-09: qty 150

## 2016-01-09 MED ORDER — BISACODYL 5 MG PO TBEC: 10.0000 mg | DELAYED_RELEASE_TABLET | Freq: Every day | ORAL | Status: DC | PRN

## 2016-01-09 MED ORDER — SENNOSIDES-DOCUSATE SODIUM 8.6-50 MG PO TABS: 1.0000 | ORAL_TABLET | Freq: Every evening | ORAL | Status: DC | PRN

## 2016-01-09 MED ORDER — PIPERACILLIN-TAZOBACTAM 3.375 G IVPB
3.3750 g | Freq: Three times a day (TID) | INTRAVENOUS | Status: DC
Start: 2016-01-09 — End: 2016-01-09
  Filled 2016-01-09 (×2): qty 50

## 2016-01-09 MED ORDER — ONDANSETRON HCL 4 MG PO TABS: 4.0000 mg | ORAL_TABLET | Freq: Four times a day (QID) | ORAL | Status: DC | PRN

## 2016-01-09 MED ORDER — ALUM & MAG HYDROXIDE-SIMETH 200-200-20 MG/5ML PO SUSP: 30.0000 mL | Freq: Four times a day (QID) | ORAL | Status: DC | PRN

## 2016-01-09 MED ORDER — GABAPENTIN 300 MG PO CAPS
300.0000 mg | ORAL_CAPSULE | Freq: Three times a day (TID) | ORAL | Status: DC
  Administered 2016-01-10 – 2016-01-14 (×13): 300 mg via ORAL
  Filled 2016-01-09 (×13): qty 1

## 2016-01-09 MED ORDER — APIXABAN 2.5 MG PO TABS
2.5000 mg | ORAL_TABLET | Freq: Two times a day (BID) | ORAL | Status: DC
  Administered 2016-01-10 – 2016-01-14 (×9): 2.5 mg via ORAL
  Filled 2016-01-09 (×13): qty 1

## 2016-01-09 MED ORDER — SODIUM CHLORIDE 0.9 % IV BOLUS (SEPSIS)
1000.0000 mL | INTRAVENOUS | Status: AC
  Administered 2016-01-09 (×2): 1000 mL via INTRAVENOUS
  Filled 2016-01-09: qty 1000

## 2016-01-09 MED ORDER — ONDANSETRON HCL 4 MG/2ML IJ SOLN: 4.0000 mg | Freq: Four times a day (QID) | INTRAMUSCULAR | Status: DC | PRN

## 2016-01-09 NOTE — ED Notes (Signed)
Attempted to obtain urine specimen by in and out cath,  No urine out.  Pt mucus membranes dry md made aware.

## 2016-01-09 NOTE — ED Notes (Signed)
Pt to ed via ems from [facility] with c/o altered mental status.   Pt skin hot to touch at this time.  Pt oriented to self, and place on arrival.  Pt mucus membranes dry.

## 2016-01-09 NOTE — H&P (Signed)
Jill House - [place] at [facility]   PATIENT NAME: Jill House    MR#:  [id number]  DATE OF BIRTH:  [birth year]  DATE OF ADMISSION:  01/09/2016  PRIMARY CARE PHYSICIAN: Jill Loveless, MD   REQUESTING/REFERRING PHYSICIAN: Sharyn Creamer, MD  CHIEF COMPLAINT:   Chief Complaint  Patient presents with  . Altered Mental Status    HISTORY OF PRESENT ILLNESS:  Jill House  is a 82 y.o. female with a known history of UTI ,A. Fib, hypertension and dementia. The patient was sent from nursing home to ED due to confusion and fever at 100.8. The patient was just discharged to skilled nursing facility from our hospital 10 days ago after treatment for sepsis with UTI. Patient systolic blood pressure was low at 60 to 70s. She was treated with normal saline bolus 2 L in the ED, but blood pressure was still low. Urinalysis show UTI. She was treated with Azactam in the ED.  PAST MEDICAL HISTORY:   Past Medical History  Diagnosis Date  . Atrial fibrillation (HCC)   . Dementia   . Hip joint replacement status   . Knee joint replacement status   . Osteoarthritis   . Hypertension   . Urinary incontinence     PAST SURGICAL HISTORY:   Past Surgical History  Procedure Laterality Date  . Replacement total knee Bilateral   . Knee surgery    . Hip replacment    . Total abdominal hysterectomy w/ bilateral salpingoophorectomy    . Orif femur fracture Left 08/15/2015    Procedure: OPEN REDUCTION INTERNAL FIXATION (ORIF) DISTAL FEMUR FRACTURE;  Surgeon: Deeann Saint, MD;  Location: [facility] ORS;  Service: Orthopedics;  Laterality: Left;  . Orif femur fracture Left 08/16/2015    Procedure: OPEN REDUCTION INTERNAL FIXATION (ORIF) DISTAL FEMUR FRACTURE;  Surgeon: Deeann Saint, MD;  Location: [facility] ORS;  Service: Orthopedics;  Laterality: Left;    SOCIAL HISTORY:   Social History  Substance Use Topics  . Smoking status: Never Smoker   . Smokeless tobacco: Not on file   . Alcohol Use: No    FAMILY HISTORY:   Family History  Problem Relation Age of Onset  . CVA Sister   . Heart attack Brother     DRUG ALLERGIES:   Allergies  Allergen Reactions  . Keflex [Cephalexin] Other (See Comments)    Reaction:  Tremors   . Ciprofloxacin Rash and Swelling    REVIEW OF SYSTEMS:  Unable to obtain.  MEDICATIONS AT HOME:   Prior to Admission medications   Medication Sig Start Date End Date Taking? Authorizing Provider  acetaminophen (TYLENOL) 325 MG tablet Take 650 mg by mouth every 4 (four) hours as needed for mild pain, fever or headache.   Yes Historical Provider, MD  alendronate (FOSAMAX) 70 MG tablet Take 70 mg by mouth once a week. Pt takes on Sunday.   Take with a full glass of water on an empty stomach.   Yes Historical Provider, MD  alum & mag hydroxide-simeth (MAALOX PLUS) 400-400-40 MG/5ML suspension Take 30 mLs by mouth every 6 (six) hours as needed for indigestion (reflux/gas).   Yes Historical Provider, MD  apixaban (ELIQUIS) 2.5 MG TABS tablet Take 2.5 mg by mouth every 12 (twelve) hours.    Yes Historical Provider, MD  bisacodyl (DULCOLAX) 5 MG EC tablet Take 10 mg by mouth daily as needed for moderate constipation.   Yes Historical Provider, MD  calcium carbonate (TUMS - DOSED IN MG ELEMENTAL  CALCIUM) 500 MG chewable tablet Chew 1 tablet by mouth 2 (two) times daily.   Yes Historical Provider, MD  cholecalciferol (VITAMIN D) 1000 UNITS tablet Take 2,000 Units by mouth daily.    Yes Historical Provider, MD  cyanocobalamin (,VITAMIN B-12,) 1000 MCG/ML injection Inject 1,000 mcg into the muscle every 30 (thirty) days. Pt uses on the first of every month.   Yes Historical Provider, MD  donepezil (ARICEPT) 10 MG tablet Take 10 mg by mouth daily.   Yes Historical Provider, MD  ferrous sulfate 325 (65 FE) MG tablet Take 1 tablet (325 mg total) by mouth 2 (two) times daily with a meal. 08/19/15  Yes Alford Highland, MD  gabapentin (NEURONTIN) 300 MG  capsule Take 1 capsule (300 mg total) by mouth 3 (three) times daily. 08/19/15  Yes Richard Renae Gloss, MD  memantine (NAMENDA XR) 28 MG CP24 24 hr capsule Take 28 mg by mouth daily.   Yes Historical Provider, MD  metoprolol tartrate (LOPRESSOR) 25 MG tablet Take 25 mg by mouth daily.   Yes Historical Provider, MD  mirtazapine (REMERON) 7.5 MG tablet Take 7.5 mg by mouth at bedtime.   Yes Historical Provider, MD  oxybutynin (DITROPAN) 5 MG tablet Take 2.5 mg by mouth 3 (three) times daily.    Yes Historical Provider, MD  oxyCODONE (OXY IR/ROXICODONE) 5 MG immediate release tablet Take 10 mg by mouth every 6 (six) hours as needed for moderate pain, severe pain or breakthrough pain.   Yes Historical Provider, MD  oxyCODONE (OXY IR/ROXICODONE) 5 MG immediate release tablet Take 5 mg by mouth every 12 (twelve) hours.   Yes Historical Provider, MD  pantoprazole (PROTONIX) 40 MG tablet Take 40 mg by mouth daily.    Yes Historical Provider, MD  potassium chloride SA (K-DUR,KLOR-CON) 20 MEQ tablet Take 20 mEq by mouth daily.   Yes Historical Provider, MD  pravastatin (PRAVACHOL) 40 MG tablet Take 40 mg by mouth at bedtime.    Yes Historical Provider, MD  bisacodyl (DULCOLAX) 10 MG suppository Place 10 mg rectally as needed for moderate constipation.    Historical Provider, MD      VITAL SIGNS:  Blood pressure 69/48, pulse 69, temperature 101.3 F (38.5 C), temperature source Rectal, resp. rate 13, weight 69.3 kg (152 lb 12.5 oz), SpO2 100 %.  PHYSICAL EXAMINATION:  GENERAL:  82 y.o.-year-old patient lying in the bed with lethargy. EYES: Pupils equal, round, reactive to light and accommodation. No scleral icterus. Extraocular muscles intact.  HEENT: Head atraumatic, normocephalic. Very dry oral mucosa.  NECK:  Supple, no jugular venous distention. No thyroid enlargement, no tenderness.  LUNGS: Normal breath sounds bilaterally, no wheezing, rales,rhonchi or crepitation. No use of accessory muscles of  respiration.  CARDIOVASCULAR: S1, S2 normal. No murmurs, rubs, or gallops.  ABDOMEN: Soft, nontender, nondistended. Bowel sounds present. No organomegaly or mass.  EXTREMITIES: No pedal edema, cyanosis, or clubbing. Left lower extremity deformity with surgical scars. NEUROLOGIC: Unable to exam.  PSYCHIATRIC: The patient is confused and lethargic. SKIN: No obvious rash, lesion, or ulcer.   LABORATORY PANEL:   CBC  Recent Labs Lab 01/09/16 0728  WBC 8.9  HGB 11.2*  HCT 34.7*  PLT 121*   ------------------------------------------------------------------------------------------------------------------  Chemistries   Recent Labs Lab 01/09/16 0728  NA 146*  K 5.1  CL 112*  CO2 27  GLUCOSE 105*  BUN 31*  CREATININE 1.76*  CALCIUM 8.9  AST 235*  ALT 72*  ALKPHOS 172*  BILITOT 1.4*   ------------------------------------------------------------------------------------------------------------------  Cardiac Enzymes  Recent Labs Lab 01/09/16 0728  TROPONINI 0.03   ------------------------------------------------------------------------------------------------------------------  RADIOLOGY:  Ct Abdomen Pelvis Wo Contrast  01/09/2016  CLINICAL DATA:  Fever of unknown origin. Transaminitis. Altered mental status. EXAM: CT ABDOMEN AND PELVIS WITHOUT CONTRAST TECHNIQUE: Multidetector CT imaging of the abdomen and pelvis was performed following the standard protocol without IV contrast. COMPARISON:  CT scan of the chest dated 08/22/2015 FINDINGS: Lower chest: There is a small pericardial effusion, unchanged. Small bilateral pleural effusions, diminished since the prior study. Overall heart size is normal. Extensive coronary artery and aortic calcification. Hepatobiliary: Gallbladder has been removed. Liver parenchyma and biliary tree are otherwise normal. Pancreas: Diffuse pancreatic atrophy. Spleen: Single tiny calcified granuloma in the spleen. Otherwise normal. Adrenals/Urinary  Tract: Normal. Stomach/Bowel: No acute abnormalities. Multiple diverticula in the distal colon. Vascular/Lymphatic: Extensive aortic and iliac atherosclerosis. No adenopathy. Reproductive: Uterus and ovaries have been removed. Other: No free air or free fluid. Musculoskeletal: Left total hip prosthesis. Diffuse degenerative disc and joint disease in the lumbar spine with slight scoliosis. IMPRESSION: No acute abnormalities of the abdomen or pelvis. Small bilateral pleural effusions. Small chronic or recurrent pericardial effusion. Extensive aortic and coronary artery atherosclerosis. Diverticulosis of the distal colon. Electronically Signed   By: Francene Boyers M.D.   On: 01/09/2016 09:01   Ct Head Wo Contrast  01/09/2016  CLINICAL DATA:  Altered mental status today.  Initial encounter. EXAM: CT HEAD WITHOUT CONTRAST TECHNIQUE: Contiguous axial images were obtained from the base of the skull through the vertex without intravenous contrast. COMPARISON:  None. FINDINGS: There is mild atrophy and chronic microvascular ischemic change. No evidence of acute intracranial abnormality including hemorrhage, infarct, mass lesion, mass effect, midline shift or abnormal extra-axial fluid collection. No hydrocephalus or pneumocephalus. The calvarium is intact. Imaged paranasal sinuses and mastoid air cells are clear. IMPRESSION: No acute abnormality. Mild atrophy and chronic microvascular ischemic change. Electronically Signed   By: Drusilla Kanner M.D.   On: 01/09/2016 08:51   Dg Chest Port 1 View  01/09/2016  CLINICAL DATA:  Altered mental status.  Fever. EXAM: PORTABLE CHEST 1 VIEW COMPARISON:  12/18/2015 FINDINGS: Right PICC line has been removed. Atherosclerotic aortic arch. Mild enlargement of the cardiopericardial silhouette, without overt edema. Retrocardiac density on the left. IMPRESSION: 1. Retrocardiac density on the left. This patient has has chronic pleural effusions which have been less apparent on frontal  projections in more apparent on lateral chest radiograph projections ; I speculate that the retrocardiac density may be due to pleural effusion and atelectasis. 2. Mild enlargement of the cardiopericardial silhouette, without overt edema. 3. Atherosclerotic aortic arch. Electronically Signed   By: Gaylyn Rong M.D.   On: 01/09/2016 07:52    EKG:   Orders placed or performed during the hospital encounter of 01/09/16  . ED EKG 12-Lead  . ED EKG 12-Lead    IMPRESSION AND PLAN:   Septic shock with UTI Admit to stepdown. Start sepsis protocol., NS bolus, continue azactam, f/u blood and urine culture. Levophed drip prn.  hold lopressor.  Acute metabolic encephalopathy.  Aspiration and fall Precaution.  ARF. Continue NS iv, F/u BMP.  Chronic Afib. hold lopressor. Continue eliquis.    All the records are reviewed and case discussed with ED provider. Management plans discussed with the patient's POA and they are in agreement.  CODE STATUS: DNR  TOTAL CRITICAL TIME TAKING CARE OF THIS PATIENT: 62 minutes.    Shaune Pollack M.D on 01/09/2016 at 10:41 AM  Between 7am to 6pm - Pager - [phone]  After 6pm go to [email or web address] - password [email or web address] [facility]  [facility]  Office  [phone]  CC: Primary care physician; Jill Loveless, MD

## 2016-01-09 NOTE — ED Provider Notes (Signed)
[facility] Emergency Department Provider Note  ____________________________________________  Time seen: Approximately 7:46 AM  I have reviewed the triage vital signs and the nursing notes.   HISTORY  Chief Complaint Altered Mental Status  EM caveat: Patient confused and altered mental status  HPI Jill House is a 82 y.o. female presents from her nursing facility after noted to have a fever to 100.8, confusion, and altered mental status. Patient was noted to be tremulous.  Review of records temperatures patient had recent sepsis with UTI and atrial flutter.   Past Medical History  Diagnosis Date  . Atrial fibrillation (HCC)   . Dementia   . Hip joint replacement status   . Knee joint replacement status   . Osteoarthritis   . Hypertension   . Urinary incontinence     Patient Active Problem List   Diagnosis Date Noted  . Septic shock (HCC) 01/09/2016  . Pressure ulcer 01/09/2016  . Malnutrition of moderate degree 12/18/2015  . Sepsis (HCC) 12/15/2015  . UTI (lower urinary tract infection) 12/15/2015  . Hip fracture (HCC) 08/13/2015    Past Surgical History  Procedure Laterality Date  . Replacement total knee Bilateral   . Knee surgery    . Hip replacment    . Total abdominal hysterectomy w/ bilateral salpingoophorectomy    . Orif femur fracture Left 08/15/2015    Procedure: OPEN REDUCTION INTERNAL FIXATION (ORIF) DISTAL FEMUR FRACTURE;  Surgeon: Deeann Saint, MD;  Location: [facility] ORS;  Service: Orthopedics;  Laterality: Left;  . Orif femur fracture Left 08/16/2015    Procedure: OPEN REDUCTION INTERNAL FIXATION (ORIF) DISTAL FEMUR FRACTURE;  Surgeon: Deeann Saint, MD;  Location: [facility] ORS;  Service: Orthopedics;  Laterality: Left;    No current outpatient prescriptions on file.  Allergies Keflex and Ciprofloxacin  Family History  Problem Relation Age of Onset  . CVA Sister   . Heart attack Brother     Social History Social  History  Substance Use Topics  . Smoking status: Never Smoker   . Smokeless tobacco: None  . Alcohol Use: No    Review of Systems EM caveat  10-point ROS otherwise negative.  ____________________________________________   PHYSICAL EXAM:  VITAL SIGNS: ED Triage Vitals  Enc Vitals Group     BP 01/09/16 0720 97/59 mmHg     Pulse Rate 01/09/16 0720 116     Resp 01/09/16 0720 22     Temp --      Temp Source 01/09/16 0720 Rectal     SpO2 01/09/16 0720 89 %     Weight 01/09/16 0720 148 lb (67.132 kg)     Height --      Head Cir --      Peak Flow --      Pain Score 01/09/16 0722 0     Pain Loc --      Pain Edu? --      Excl. in GC? --    Constitutional: Alert but somnolent, disoriented. Eyes: Conjunctivae are normal. PERRL. EOMI. Head: Atraumatic. Nose: No congestion/rhinnorhea. Mouth/Throat: Mucous membranes are very dry.  Oropharynx non-erythematous. Neck: No stridor.  No meningismus. Cardiovascular: Tachycardic and irregular Grossly normal heart sounds.  Good peripheral circulation. Respiratory: Normal respiratory effort.  No retractions. Lungs CTAB. Gastrointestinal: Soft and nontender. No distention.  Musculoskeletal: No lower extremity tenderness nor edema.  Neurologic:  The patient somnolent, confused. She is able to answer very simple questions such as her name but no further. She does have equal smile,  she does follow basic commands and lifts both arms without difficulty. There is no evidence of an acute neurologic deficit, however her altered mental status is concerning. No seizure-like activity. Skin:  Skin is warm, dry and intact. No rash noted. Psychiatric: Mood and affect are normal. Speech and behavior are normal.  ____________________________________________   LABS (all labs ordered are listed, but only abnormal results are displayed)  Labs Reviewed  COMPREHENSIVE METABOLIC PANEL - Abnormal; Notable for the following:    Sodium 146 (*)    Chloride 112  (*)    Glucose, Bld 105 (*)    BUN 31 (*)    Creatinine, Ser 1.76 (*)    Albumin 3.3 (*)    AST 235 (*)    ALT 72 (*)    Alkaline Phosphatase 172 (*)    Total Bilirubin 1.4 (*)    GFR calc non Af Amer 24 (*)    GFR calc Af Amer 28 (*)    All other components within normal limits  CBC WITH DIFFERENTIAL/PLATELET - Abnormal; Notable for the following:    RBC 3.66 (*)    Hemoglobin 11.2 (*)    HCT 34.7 (*)    RDW 15.5 (*)    Platelets 121 (*)    Neutro Abs 8.0 (*)    Lymphs Abs 0.3 (*)    All other components within normal limits  PROTIME-INR - Abnormal; Notable for the following:    Prothrombin Time 18.9 (*)    All other components within normal limits  BLOOD GAS, VENOUS - Abnormal; Notable for the following:    pH, Ven 7.31 (*)    Bicarbonate 28.7 (*)    All other components within normal limits  BLOOD GAS, ARTERIAL - Abnormal; Notable for the following:    pO2, Arterial 220 (*)    Allens test (pass/fail) POSITIVE (*)    All other components within normal limits  MRSA PCR SCREENING  CULTURE, BLOOD (ROUTINE X 2)  CULTURE, BLOOD (ROUTINE X 2)  URINE CULTURE  LACTIC ACID, PLASMA  LACTIC ACID, PLASMA  TROPONIN I  LIPASE, BLOOD  URINALYSIS COMPLETEWITH MICROSCOPIC ([facility] ONLY)   ____________________________________________  EKG  Reviewed interim by me at 7:20 AM Probable atrial fibrillation Ventricular rate 138 QRS 140 QTc 4:30 No acute ischemic or melena noted though some artifact is present ____________________________________________  RADIOLOGY  CT Head Wo Contrast (Final result) Result time: 01/09/16 08:51:20   Final result by Rad Results In Interface (01/09/16 08:51:20)   Narrative:   CLINICAL DATA: Altered mental status today. Initial encounter.  EXAM: CT HEAD WITHOUT CONTRAST  TECHNIQUE: Contiguous axial images were obtained from the base of the skull through the vertex without intravenous contrast.  COMPARISON: None.  FINDINGS: There is  mild atrophy and chronic microvascular ischemic change. No evidence of acute intracranial abnormality including hemorrhage, infarct, mass lesion, mass effect, midline shift or abnormal extra-axial fluid collection. No hydrocephalus or pneumocephalus. The calvarium is intact. Imaged paranasal sinuses and mastoid air cells are clear.  IMPRESSION: No acute abnormality.  Mild atrophy and chronic microvascular ischemic change.   Electronically Signed By: Drusilla Kanner M.D. On: 01/09/2016 08:51          CT Abdomen Pelvis Wo Contrast (Final result) Result time: 01/09/16 09:01:14   Final result by Rad Results In Interface (01/09/16 09:01:14)   Narrative:   CLINICAL DATA: Fever of unknown origin. Transaminitis. Altered mental status.  EXAM: CT ABDOMEN AND PELVIS WITHOUT CONTRAST  TECHNIQUE: Multidetector CT imaging of the  abdomen and pelvis was performed following the standard protocol without IV contrast.  COMPARISON: CT scan of the chest dated 08/22/2015  FINDINGS: Lower chest: There is a small pericardial effusion, unchanged. Small bilateral pleural effusions, diminished since the prior study. Overall heart size is normal. Extensive coronary artery and aortic calcification.  Hepatobiliary: Gallbladder has been removed. Liver parenchyma and biliary tree are otherwise normal.  Pancreas: Diffuse pancreatic atrophy.  Spleen: Single tiny calcified granuloma in the spleen. Otherwise normal.  Adrenals/Urinary Tract: Normal.  Stomach/Bowel: No acute abnormalities. Multiple diverticula in the distal colon.  Vascular/Lymphatic: Extensive aortic and iliac atherosclerosis. No adenopathy.  Reproductive: Uterus and ovaries have been removed.  Other: No free air or free fluid.  Musculoskeletal: Left total hip prosthesis. Diffuse degenerative disc and joint disease in the lumbar spine with slight scoliosis.  IMPRESSION: No acute abnormalities of the abdomen or  pelvis. Small bilateral pleural effusions. Small chronic or recurrent pericardial effusion.  Extensive aortic and coronary artery atherosclerosis.  Diverticulosis of the distal colon.   Electronically Signed By: Francene Boyers M.D. On: 01/09/2016 09:01          DG Chest Port 1 View (Final result) Result time: 01/09/16 07:52:16   Final result by Rad Results In Interface (01/09/16 07:52:16)   Narrative:   CLINICAL DATA: Altered mental status. Fever.  EXAM: PORTABLE CHEST 1 VIEW  COMPARISON: 12/18/2015  FINDINGS: Right PICC line has been removed. Atherosclerotic aortic arch. Mild enlargement of the cardiopericardial silhouette, without overt edema. Retrocardiac density on the left.  IMPRESSION: 1. Retrocardiac density on the left. This patient has has chronic pleural effusions which have been less apparent on frontal projections in more apparent on lateral chest radiograph projections ; I speculate that the retrocardiac density may be due to pleural effusion and atelectasis. 2. Mild enlargement of the cardiopericardial silhouette, without overt edema. 3. Atherosclerotic aortic arch.   Electronically Signed By: Gaylyn Rong M.D. On: 01/09/2016 07:52    ____________________________________________   PROCEDURES  Procedure(s) performed: None  Critical Care performed: Yes, see critical care note(s)  CRITICAL CARE Performed by: Sharyn Creamer   Total critical care time: 50 minutes  Critical care time was exclusive of separately billable procedures and treating other patients.  Critical care was necessary to treat or prevent imminent or life-threatening deterioration.  Critical care was time spent personally by me on the following activities: development of treatment plan with patient and/or surrogate as well as nursing, discussions with consultants, evaluation of patient's response to treatment, examination of patient, obtaining history from  patient or surrogate, ordering and performing treatments and interventions, ordering and review of laboratory studies, ordering and review of radiographic studies, pulse oximetry and re-evaluation of patient's condition.  ____________________________________________   INITIAL IMPRESSION / ASSESSMENT AND PLAN / ED COURSE  Pertinent labs & imaging results that were available during my care of the patient were reviewed by me and considered in my medical decision making (see chart for details).  ----------------------------------------- 8:15 AM on 01/09/2016 -----------------------------------------  Patient alertness improved. Now sitting up conversant. Denies pain. Talking with her family and recognizes myself and her niece who is at bedside. Blood pressure improved now 97/60. Heart rate 105.  ----------------------------------------- 9:08 AM on 01/09/2016 -----------------------------------------  ----------------------------------------- 9:27 AM on 01/09/2016 -----------------------------------------  Blood pressure 86/47. The patient continues to [facility] well. Discussed with patient's niece.   ____________________________________________   FINAL CLINICAL IMPRESSION(S) / ED DIAGNOSES  Final diagnoses:  Severe sepsis (HCC)  Fever, unspecified fever cause  Hypotension, unspecified  hypotension type  Acute kidney injury (nontraumatic) (HCC)      Sharyn Creamer, MD 01/09/16 1606

## 2016-01-09 NOTE — Progress Notes (Signed)
Jill House will open her eyes to speech and follow commands. PICC line placed today [facility]. Levophed drip started for a MAP goal 65. Urinalysis collected. ABG normal. A. Fib on the monitor. Afebrile.

## 2016-01-09 NOTE — Progress Notes (Signed)
Pharmacy Antibiotic Note  Jill House is a 82 y.o. female admitted on 01/09/2016 with sepsis and UTI.  Pharmacy has been consulted for vancomycin and Zosyn dosing. Patient initially ordered aztreonam due to cephalosporin allergy, however reaction is tremor and patient received Zosyn in January.   Plan: Vancomycin 1000 mg once given in ED. Will continue with vancomycin 750 IV every 36 hours with stacked dosing 18 hours after initial dose.  Goal trough 15-20 mcg/mL. Will not order a trough at this time but will follow renal function closely and change dosing/order levels as clinically indicated.  Will begin Zosyn 3.375 g EI q 12 hours.   Weight: 152 lb 12.5 oz (69.3 kg)  Temp (24hrs), Avg:99.6 F (37.6 C), Min:97.9 F (36.6 C), Max:101.3 F (38.5 C)   Recent Labs Lab 01/06/16 0735 01/09/16 0728 01/09/16 1057  WBC 3.2* 8.9  --   CREATININE 1.17* 1.76*  --   LATICACIDVEN  --  1.2 1.7    Estimated Creatinine Clearance: 19.8 mL/min (by C-G formula based on Cr of 1.76).    Allergies  Allergen Reactions  . Keflex [Cephalexin] Other (See Comments)    Reaction:  Tremors   . Ciprofloxacin Rash and Swelling    Antimicrobials this admission: Aztreonam 02/10 >> 02/10 Vancomycin 02/10 >> Zosyn 02/10 >>  Dose adjustments this admission:   Microbiology results: Cultures pending  Thank you for allowing pharmacy to be a part of this patient's care.  Luisa Hart D 01/09/2016 11:59 AM

## 2016-01-09 NOTE — Progress Notes (Signed)
PO meds held today due to pt being confused, drowsy. Dr. is Imogene Burn aware

## 2016-01-10 ENCOUNTER — Inpatient Hospital Stay: Payer: Medicare Other

## 2016-01-10 LAB — BASIC METABOLIC PANEL
Anion gap: 8 (ref 5–15)
BUN: 28 mg/dL — ABNORMAL HIGH (ref 6–20)
CHLORIDE: 119 mmol/L — AB (ref 101–111)
CO2: 19 mmol/L — ABNORMAL LOW (ref 22–32)
Calcium: 7.7 mg/dL — ABNORMAL LOW (ref 8.9–10.3)
Creatinine, Ser: 1.14 mg/dL — ABNORMAL HIGH (ref 0.44–1.00)
GFR, EST AFRICAN AMERICAN: 48 mL/min — AB (ref >60)
GFR, EST NON AFRICAN AMERICAN: 41 mL/min — AB (ref >60)
Glucose, Bld: 92 mg/dL (ref 65–99)
POTASSIUM: 5.1 mmol/L (ref 3.5–5.1)
SODIUM: 146 mmol/L — AB (ref 135–145)

## 2016-01-10 LAB — CBC
HEMATOCRIT: 33.6 % — AB (ref 35.0–47.0)
Hemoglobin: 11 g/dL — ABNORMAL LOW (ref 12.0–16.0)
MCH: 31.3 pg (ref 26.0–34.0)
MCHC: 32.6 g/dL (ref 32.0–36.0)
MCV: 96.1 fL (ref 80.0–100.0)
PLATELETS: 107 10*3/uL — AB (ref 150–440)
RBC: 3.5 MIL/uL — AB (ref 3.80–5.20)
RDW: 15.7 % — ABNORMAL HIGH (ref 11.5–14.5)
WBC: 10.8 10*3/uL (ref 3.6–11.0)

## 2016-01-10 MED ORDER — ALBUTEROL SULFATE (2.5 MG/3ML) 0.083% IN NEBU
2.5000 mg | INHALATION_SOLUTION | RESPIRATORY_TRACT | Status: DC
  Administered 2016-01-10 – 2016-01-11 (×9): 2.5 mg via RESPIRATORY_TRACT
  Filled 2016-01-10 (×9): qty 3

## 2016-01-10 MED ORDER — VANCOMYCIN HCL IN DEXTROSE 1-5 GM/200ML-% IV SOLN
1000.0000 mg | INTRAVENOUS | Status: DC
  Administered 2016-01-11: 1000 mg via INTRAVENOUS
  Filled 2016-01-10 (×3): qty 200

## 2016-01-10 MED ORDER — METOPROLOL TARTRATE 25 MG PO TABS
12.5000 mg | ORAL_TABLET | Freq: Two times a day (BID) | ORAL | Status: DC
  Administered 2016-01-11 – 2016-01-12 (×2): 12.5 mg via ORAL
  Filled 2016-01-10 (×2): qty 1

## 2016-01-10 MED ORDER — PIPERACILLIN-TAZOBACTAM 3.375 G IVPB
3.3750 g | Freq: Three times a day (TID) | INTRAVENOUS | Status: DC
  Administered 2016-01-10 – 2016-01-14 (×12): 3.375 g via INTRAVENOUS
  Filled 2016-01-10 (×16): qty 50

## 2016-01-10 MED ORDER — FUROSEMIDE 10 MG/ML IJ SOLN
20.0000 mg | Freq: Once | INTRAMUSCULAR | Status: AC
  Administered 2016-01-10: 20 mg via INTRAVENOUS
  Filled 2016-01-10: qty 2

## 2016-01-10 NOTE — Progress Notes (Signed)
[facility] [place] [facility]   PATIENT NAME: Jill House    MR#:  [id number]  DATE OF BIRTH:  Jun 23, 1924  SUBJECTIVE:  CHIEF COMPLAINT:   Chief Complaint  Patient presents with  . Altered Mental Status   the patient is alert awake, 2. She is off Levophed drip. She had cough, sputum, tachycardia and dyspnea is morning.  REVIEW OF SYSTEMS:  CONSTITUTIONAL: No fever, has generalized weakness.  EYES: No blurred or double vision.  EARS, NOSE, AND THROAT: No tinnitus or ear pain.  RESPIRATORY: Has cough and shortness of breath, no wheezing or hemoptysis.  CARDIOVASCULAR: No chest pain, orthopnea, edema.  GASTROINTESTINAL: No nausea, vomiting, diarrhea or abdominal pain.  GENITOURINARY: No dysuria, hematuria.  ENDOCRINE: No polyuria, nocturia,  HEMATOLOGY: No anemia, easy bruising or bleeding SKIN: No rash or lesion. MUSCULOSKELETAL: No joint pain or arthritis.   NEUROLOGIC: No tingling, numbness, weakness.  PSYCHIATRY: No anxiety or depression.   DRUG ALLERGIES:   Allergies  Allergen Reactions  . Keflex [Cephalexin] Other (See Comments)    Reaction:  Tremors   . Ciprofloxacin Rash and Swelling    VITALS:  Blood pressure 95/87, pulse 94, temperature 98.7 F (37.1 C), temperature source Oral, resp. rate 23, height  (1.575 m), weight 68.2 kg (150 lb 5.7 oz), SpO2 99 %.  PHYSICAL EXAMINATION:  GENERAL:  82 y.o.-year-old patient lying in the bed with no acute distress.  EYES: Pupils equal, round, reactive to light and accommodation. No scleral icterus. Extraocular muscles intact.  HEENT: Head atraumatic, normocephalic. Oropharynx and nasopharynx clear.  NECK:  Supple, no jugular venous distention. No thyroid enlargement, no tenderness.  LUNGS: Normal breath sounds bilaterally, no wheezing, but has rales. Mild use of accessory muscles of respiration.  CARDIOVASCULAR: S1, S2 normal. No murmurs, rubs, or gallops.  ABDOMEN: Soft,  nontender, nondistended. Bowel sounds present. No organomegaly or mass.  EXTREMITIES: No pedal edema, cyanosis, or clubbing.  NEUROLOGIC: Cranial nerves II through XII are intact. Muscle strength 4/5 in all extremities. Sensation intact. Gait not checked.  PSYCHIATRIC: The patient is alert and oriented x 2.  SKIN: No obvious rash, lesion, or ulcer.    LABORATORY PANEL:   CBC  Recent Labs Lab 01/10/16 0654  WBC 10.8  HGB 11.0*  HCT 33.6*  PLT 107*   ------------------------------------------------------------------------------------------------------------------  Chemistries   Recent Labs Lab 01/09/16 0728 01/10/16 0654  NA 146* 146*  K 5.1 5.1  CL 112* 119*  CO2 27 19*  GLUCOSE 105* 92  BUN 31* 28*  CREATININE 1.76* 1.14*  CALCIUM 8.9 7.7*  AST 235*  --   ALT 72*  --   ALKPHOS 172*  --   BILITOT 1.4*  --    ------------------------------------------------------------------------------------------------------------------  Cardiac Enzymes  Recent Labs Lab 01/09/16 0728  TROPONINI 0.03   ------------------------------------------------------------------------------------------------------------------  RADIOLOGY:  Ct Abdomen Pelvis Wo Contrast  01/09/2016  CLINICAL DATA:  Fever of unknown origin. Transaminitis. Altered mental status. EXAM: CT ABDOMEN AND PELVIS WITHOUT CONTRAST TECHNIQUE: Multidetector CT imaging of the abdomen and pelvis was performed following the standard protocol without IV contrast. COMPARISON:  CT scan of the chest dated 08/22/2015 FINDINGS: Lower chest: There is a small pericardial effusion, unchanged. Small bilateral pleural effusions, diminished since the prior study. Overall heart size is normal. Extensive coronary artery and aortic calcification. Hepatobiliary: Gallbladder has been removed. Liver parenchyma and biliary tree are otherwise normal. Pancreas: Diffuse pancreatic atrophy. Spleen: Single tiny calcified granuloma in the spleen.  Otherwise  normal. Adrenals/Urinary Tract: Normal. Stomach/Bowel: No acute abnormalities. Multiple diverticula in the distal colon. Vascular/Lymphatic: Extensive aortic and iliac atherosclerosis. No adenopathy. Reproductive: Uterus and ovaries have been removed. Other: No free air or free fluid. Musculoskeletal: Left total hip prosthesis. Diffuse degenerative disc and joint disease in the lumbar spine with slight scoliosis. IMPRESSION: No acute abnormalities of the abdomen or pelvis. Small bilateral pleural effusions. Small chronic or recurrent pericardial effusion. Extensive aortic and coronary artery atherosclerosis. Diverticulosis of the distal colon. Electronically Signed   By: Francene Boyers M.D.   On: 01/09/2016 09:01   Ct Head Wo Contrast  01/09/2016  CLINICAL DATA:  Altered mental status today.  Initial encounter. EXAM: CT HEAD WITHOUT CONTRAST TECHNIQUE: Contiguous axial images were obtained from the base of the skull through the vertex without intravenous contrast. COMPARISON:  None. FINDINGS: There is mild atrophy and chronic microvascular ischemic change. No evidence of acute intracranial abnormality including hemorrhage, infarct, mass lesion, mass effect, midline shift or abnormal extra-axial fluid collection. No hydrocephalus or pneumocephalus. The calvarium is intact. Imaged paranasal sinuses and mastoid air cells are clear. IMPRESSION: No acute abnormality. Mild atrophy and chronic microvascular ischemic change. Electronically Signed   By: Drusilla Kanner M.D.   On: 01/09/2016 08:51   Dg Chest Port 1 View  01/10/2016  CLINICAL DATA:  82 year old female with shortness of breath and altered mental status. EXAM: PORTABLE CHEST 1 VIEW COMPARISON:  01/09/2016 and prior exam FINDINGS: Cardiomegaly and right PICC line with tip overlying the lower SVC again noted. New moderate interstitial pulmonary edema identified. Left basilar atelectasis/airspace disease and mild right basilar atelectasis  identified. There is no evidence of pneumothorax. IMPRESSION: New moderate interstitial pulmonary edema. Left basilar atelectasis/airspace disease and mild right basilar atelectasis again noted. Electronically Signed   By: Harmon Pier M.D.   On: 01/10/2016 09:22   Dg Chest Port 1 View  01/09/2016  CLINICAL DATA:  82 year old female -evaluate PICC line placement. EXAM: PORTABLE CHEST 1 VIEW COMPARISON:  01/09/2016 FINDINGS: Cardiomegaly again identified. Mild left basilar opacity is unchanged. A right-sided PICC line is now identified with tip overlying the lower SVC. There is no evidence of pneumothorax. No other significant changes are present. IMPRESSION: Right PICC line with tip overlying the lower SVC. Continued left basilar opacity. Electronically Signed   By: Harmon Pier M.D.   On: 01/09/2016 15:13   Dg Chest Port 1 View  01/09/2016  CLINICAL DATA:  Altered mental status.  Fever. EXAM: PORTABLE CHEST 1 VIEW COMPARISON:  12/18/2015 FINDINGS: Right PICC line has been removed. Atherosclerotic aortic arch. Mild enlargement of the cardiopericardial silhouette, without overt edema. Retrocardiac density on the left. IMPRESSION: 1. Retrocardiac density on the left. This patient has has chronic pleural effusions which have been less apparent on frontal projections in more apparent on lateral chest radiograph projections ; I speculate that the retrocardiac density may be due to pleural effusion and atelectasis. 2. Mild enlargement of the cardiopericardial silhouette, without overt edema. 3. Atherosclerotic aortic arch. Electronically Signed   By: Gaylyn Rong M.D.   On: 01/09/2016 07:52    EKG:   Orders placed or performed during the hospital encounter of 01/09/16  . ED EKG 12-Lead  . ED EKG 12-Lead    ASSESSMENT AND PLAN:   Septic shock with UTI  Hypotension improved. Off evophed drip. Discontinue normal saline IV due to dyspnea.  continue azactam, f/u blood and urine culture.   Acute  metabolic encephalopathy. Improved. Baseline. Aspiration and fall  Precaution.   ARF.  Improving, disontinue NS iv due to dyspnea .   Chronic Afib RVR. resume lopressor. Continue eliquis.   Pulmonary edema (CXR) due to fluid overload. Lasix 20 mg 1 dose. Discontinued IV fluid   dementia. Aspiration and fall precaution.  PT consult. All the records are reviewed and case discussed with Care Management/Social Workerr. Management plans discussed with the patient, family and they are in agreement.  CODE STATUS: DO NOT RESUSCITATE  TOTAL TIME TAKING CARE OF THIS PATIENT: 42 minutes.  Greater than 50% time was spent on coordination of care and face-to-face counseling.  POSSIBLE D/C IN 3 DAYS, DEPENDING ON CLINICAL CONDITION.   Shaune Pollack M.D on 01/10/2016 at 1:41 PM  Between 7am to 6pm - Pager - [phone]  After 6pm go to [email or web address] - password [email or web address] [facility]  [place] [facility]  Office  [phone]  CC: Primary care physician; Margaretann Loveless, MD

## 2016-01-10 NOTE — Progress Notes (Signed)
Shift summary- Patient has progressively gotten more alert during the day. Recognized nephew. Knows she is at the hospital. Ask questions. Naps at intervals but watches TV. Even used bedpan so she wouldn't wet her diaper.Lives with nephew and his wife.VSS. No fever. Short of breath this morning. Respiratory rate in the 40s. Lungs were clear but diminished.Juglar veins distended.  Dr. Imogene Burn notified. Chest x-ray done-pulmonaryedema. Given Lasix per order. After lunch resp rate. slower in the 20s. O2 sats better.

## 2016-01-10 NOTE — Progress Notes (Signed)
Pharmacy Antibiotic Note  Jill House is a 82 y.o. female admitted on 01/09/2016 with sepsis and UTI.  Pharmacy has been consulted for vancomycin and Zosyn dosing. Patient initially ordered aztreonam due to cephalosporin allergy, however reaction is tremor and patient received Zosyn in January.   Plan: Vancomycin 1000 mg once given in ED. Will continue with vancomycin 750 IV every 36 hours with stacked dosing 18 hours after initial dose.  Goal trough 15-20 mcg/mL. Will not order a trough at this time but will follow renal function closely and change dosing/order levels as clinically indicated.  Will begin Zosyn 3.375 g EI q 12 hours.   2/11: Scr improved. Zosyn changed to EI q8h. Vancomycin adjusted to  IV q36h. Will schedule trough prior to 4th dose on 2/13 at 2230.    Ke 0.026  T1/2 26.66 Vd 47.  Height:  (157.5 cm) Weight: 150 lb 5.7 oz (68.2 kg) IBW/kg (Calculated) : 50.1  Temp (24hrs), Avg:98.8 F (37.1 C), Min:98.7 F (37.1 C), Max:98.8 F (37.1 C)   Recent Labs Lab 01/06/16 0735 01/09/16 0728 01/09/16 1057 01/10/16 0654  WBC 3.2* 8.9  --  10.8  CREATININE 1.17* 1.76*  --  1.14*  LATICACIDVEN  --  1.2 1.7  --     Estimated Creatinine Clearance: 30.3 mL/min (by C-G formula based on Cr of 1.14).    Allergies  Allergen Reactions  . Keflex [Cephalexin] Other (See Comments)    Reaction:  Tremors   . Ciprofloxacin Rash and Swelling    Antimicrobials this admission: Aztreonam 02/10 >> 02/10 Vancomycin 02/10 >> Zosyn 02/10 >>  Dose adjustments this admission:   Microbiology results: Cultures pending 2/10 MRSA PCR neg.  Thank you for allowing pharmacy to be a part of this patient's care.  Azlan Hanway A 01/10/2016 2:52 PM

## 2016-01-11 ENCOUNTER — Inpatient Hospital Stay
Admit: 2016-01-11 | Discharge: 2016-01-11 | Disposition: A | Discharge status: Home / Self Care | Payer: Medicare Other | Attending: Cardiovascular Disease | Admitting: Cardiovascular Disease

## 2016-01-11 LAB — BASIC METABOLIC PANEL
Anion gap: 3 — ABNORMAL LOW (ref 5–15)
BUN: 26 mg/dL — AB (ref 6–20)
CHLORIDE: 113 mmol/L — AB (ref 101–111)
CO2: 29 mmol/L (ref 22–32)
CREATININE: 1.16 mg/dL — AB (ref 0.44–1.00)
Calcium: 7.9 mg/dL — ABNORMAL LOW (ref 8.9–10.3)
GFR calc Af Amer: 47 mL/min — ABNORMAL LOW (ref >60)
GFR calc non Af Amer: 40 mL/min — ABNORMAL LOW (ref >60)
GLUCOSE: 123 mg/dL — AB (ref 65–99)
Potassium: 3.4 mmol/L — ABNORMAL LOW (ref 3.5–5.1)
SODIUM: 145 mmol/L (ref 135–145)

## 2016-01-11 LAB — CBC
HCT: 29.5 % — ABNORMAL LOW (ref 35.0–47.0)
Hemoglobin: 9.7 g/dL — ABNORMAL LOW (ref 12.0–16.0)
MCH: 30.6 pg (ref 26.0–34.0)
MCHC: 32.9 g/dL (ref 32.0–36.0)
MCV: 92.9 fL (ref 80.0–100.0)
PLATELETS: 118 10*3/uL — AB (ref 150–440)
RBC: 3.18 MIL/uL — ABNORMAL LOW (ref 3.80–5.20)
RDW: 15.3 % — AB (ref 11.5–14.5)
WBC: 6.2 10*3/uL (ref 3.6–11.0)

## 2016-01-11 LAB — URINE CULTURE

## 2016-01-11 LAB — MAGNESIUM: Magnesium: 1.7 mg/dL (ref 1.7–2.4)

## 2016-01-11 MED ORDER — POTASSIUM CHLORIDE CRYS ER 20 MEQ PO TBCR
40.0000 meq | EXTENDED_RELEASE_TABLET | Freq: Once | ORAL | Status: AC
  Administered 2016-01-11: 40 meq via ORAL
  Filled 2016-01-11: qty 2

## 2016-01-11 MED ORDER — DIGOXIN 0.25 MG/ML IJ SOLN
0.2500 mg | Freq: Once | INTRAMUSCULAR | Status: AC
  Administered 2016-01-11: 0.25 mg via INTRAVENOUS
  Filled 2016-01-11: qty 2

## 2016-01-11 NOTE — Evaluation (Signed)
Physical Therapy Evaluation Patient Details Name: Jill House MRN: [id number] DOB: 10-01-1924 Today's Date: 01/11/2016   History of Present Illness  Pt is an 61 t/o female here with septic shock, she has dementia and a-fib.    Clinical Impression  Pt is in CCU with Afib, elevated HR and low BP.  Nursing gave okay to see pt.  Pt was able to go some limited in bed activity, sit at EOB and do some standing/side stepping but was very fatigued with the effort and had fluctuating HR from 100s to 140s t/o the session.  Her O2 did stay in the high 90s on room air t/o the effort.  Pt with some confusion, but generally participated well and despite her fatigue showed good effort. Pt is functionally limited at this time and will need short term rehab.    Follow Up Recommendations SNF    Equipment Recommendations       Recommendations for Other Services       Precautions / Restrictions Precautions Precautions: Fall Restrictions Weight Bearing Restrictions: No      Mobility  Bed Mobility Overal bed mobility: Needs Assistance Bed Mobility: Supine to Sit;Sit to Supine     Supine to sit: Min assist Sit to supine: Mod assist   General bed mobility comments: Pt shows good effort getting to EOB, she does need assist and though she is able to maintain sitting balance her HR is up and down with the effort of getting to sitting.  Statis sitting her HR fluctuates (but slowly comes down) from the 130s to the 100s  Transfers Overall transfer level: Needs assistance Equipment used: Rolling walker (2 wheeled) Transfers: Sit to/from Stand Sit to Stand: Min assist         General transfer comment: Pt shows good effort getting to standing, she is able to rise with only light assist to keep hips forward and physical cues for foot and UE positioning  Ambulation/Gait Ambulation/Gait assistance: Min assist           General Gait Details: Side stepping along EOB with FWW and min assist.   Pt fatigues quickly with the effort and though she is motivated simply is unable to do much more.  Pt's HR again rises with activity into the 140s.   Stairs            Wheelchair Mobility    Modified Rankin (Stroke Patients Only)       Balance                                             Pertinent Vitals/Pain Pain Assessment: No/denies pain    Home Living Family/patient expects to be discharged to:: Skilled nursing facility                      Prior Function Level of Independence: Independent with assistive device(s)         Comments: Furiture walking inside, rolling walker outside - per pt report     Hand Dominance        Extremity/Trunk Assessment   Upper Extremity Assessment: LUE deficits/detail       LUE Deficits / Details: Pt's R UE grossly WFL with age approrpiate deficits, L UE grossly 3-/5    Lower Extremity Assessment: Generalized weakness (R LE grossly 4/5, L 4-/5)  Communication   Communication: No difficulties  Cognition Arousal/Alertness: Awake/alert (pt with some confusion, baseline dementia) Behavior During Therapy: WFL for tasks assessed/performed Overall Cognitive Status: History of cognitive impairments - at baseline                      General Comments      Exercises        Assessment/Plan    PT Assessment Patient needs continued PT services  PT Diagnosis Generalized weakness;Difficulty walking   PT Problem List Decreased strength;Decreased safety awareness;Decreased balance;Decreased mobility;Decreased activity tolerance;Decreased cognition;Decreased knowledge of use of DME  PT Treatment Interventions DME instruction;Gait training;Functional mobility training;Therapeutic activities;Therapeutic exercise;Balance training;Neuromuscular re-education   PT Goals (Current goals can be found in the Care Plan section) Acute Rehab PT Goals Patient Stated Goal: Pt wants to get stronger PT  Goal Formulation: With patient Time For Goal Achievement: 01/25/16 Potential to Achieve Goals: Fair    Frequency Min 2X/week   Barriers to discharge        Co-evaluation               End of Session                 Time: 8295-6213 PT Time Calculation (min) (ACUTE ONLY): 20 min   Charges:   PT Evaluation $PT Eval Moderate Complexity: 1 Procedure     PT G Codes:       Loran Senters, PT, DPT [phone]  Malachi Pro 01/11/2016, 4:41 PM

## 2016-01-11 NOTE — Progress Notes (Signed)
Jill House is a 82 y.o. female  [id number]  Primary Cardiologist: Adrian Blackwater Reason for Consultation: Altered mental status and pulmonary edema/atrial fibrillation  HPI: This 82 year old white female with a past medical history of atrial fibrillation hypertension dementia presented to the hospital with altered mental status and 101 temperature. She was also hypotensive and was found to be in atrial fibrillation with rapid ventricular response rate. Patient states that she does not remember why she was admitted to the hospital.   Review of Systems: Unable to get review of system.   Past Medical History  Diagnosis Date  . Atrial fibrillation (HCC)   . Dementia   . Hip joint replacement status   . Knee joint replacement status   . Osteoarthritis   . Hypertension   . Urinary incontinence     Medications Prior to Admission  Medication Sig Dispense Refill  . acetaminophen (TYLENOL) 325 MG tablet Take 650 mg by mouth every 4 (four) hours as needed for mild pain, fever or headache.    . alendronate (FOSAMAX) 70 MG tablet Take 70 mg by mouth once a week. Pt takes on Sunday.   Take with a full glass of water on an empty stomach.    Marland Kitchen alum & mag hydroxide-simeth (MAALOX PLUS) 400-400-40 MG/5ML suspension Take 30 mLs by mouth every 6 (six) hours as needed for indigestion (reflux/gas).    Marland Kitchen apixaban (ELIQUIS) 2.5 MG TABS tablet Take 2.5 mg by mouth every 12 (twelve) hours.     . bisacodyl (DULCOLAX) 5 MG EC tablet Take 10 mg by mouth daily as needed for moderate constipation.    . calcium carbonate (TUMS - DOSED IN MG ELEMENTAL CALCIUM) 500 MG chewable tablet Chew 1 tablet by mouth 2 (two) times daily.    . cholecalciferol (VITAMIN D) 1000 UNITS tablet Take 2,000 Units by mouth daily.     . cyanocobalamin (,VITAMIN B-12,) 1000 MCG/ML injection Inject 1,000 mcg into the muscle every 30 (thirty) days. Pt uses on the first of every month.    . donepezil (ARICEPT) 10 MG tablet Take 10  mg by mouth daily.    . ferrous sulfate 325 (65 FE) MG tablet Take 1 tablet (325 mg total) by mouth 2 (two) times daily with a meal. 60 tablet 3  . gabapentin (NEURONTIN) 300 MG capsule Take 1 capsule (300 mg total) by mouth 3 (three) times daily.    . memantine (NAMENDA XR) 28 MG CP24 24 hr capsule Take 28 mg by mouth daily.    . metoprolol tartrate (LOPRESSOR) 25 MG tablet Take 25 mg by mouth daily.    . mirtazapine (REMERON) 7.5 MG tablet Take 7.5 mg by mouth at bedtime.    Marland Kitchen oxybutynin (DITROPAN) 5 MG tablet Take 2.5 mg by mouth 3 (three) times daily.     Marland Kitchen oxyCODONE (OXY IR/ROXICODONE) 5 MG immediate release tablet Take 10 mg by mouth every 6 (six) hours as needed for moderate pain, severe pain or breakthrough pain.    Marland Kitchen oxyCODONE (OXY IR/ROXICODONE) 5 MG immediate release tablet Take 5 mg by mouth every 12 (twelve) hours.    . pantoprazole (PROTONIX) 40 MG tablet Take 40 mg by mouth daily.     . potassium chloride SA (K-DUR,KLOR-CON) 20 MEQ tablet Take 20 mEq by mouth daily.    . pravastatin (PRAVACHOL) 40 MG tablet Take 40 mg by mouth at bedtime.     . bisacodyl (DULCOLAX) 10 MG suppository Place 10 mg rectally as needed  for moderate constipation.       Marland Kitchen albuterol  2.5 mg Nebulization Q4H  . apixaban  2.5 mg Oral Q12H  . calcium carbonate  1 tablet Oral BID  . donepezil  10 mg Oral Daily  . ferrous sulfate  325 mg Oral BID WC  . gabapentin  300 mg Oral TID  . memantine  28 mg Oral Daily  . metoprolol tartrate  12.5 mg Oral BID  . mirtazapine  7.5 mg Oral QHS  . oxybutynin  2.5 mg Oral TID  . pantoprazole  40 mg Oral Daily  . piperacillin-tazobactam (ZOSYN)  IV  3.375 g Intravenous 3 times per day  . pravastatin  40 mg Oral QHS  . sodium chloride  500 mL Intravenous Once  . vancomycin  1,000 mg Intravenous Q36H    Infusions: . norepinephrine (LEVOPHED) Adult infusion 2 mcg/min (01/11/16 0914)    Allergies  Allergen Reactions  . Keflex [Cephalexin] Other (See Comments)     Reaction:  Tremors   . Ciprofloxacin Rash and Swelling    Social History   Social History  . Marital Status: Widowed    Spouse Name: N/A  . Number of Children: N/A  . Years of Education: N/A   Occupational History  . Not on file.   Social History Main Topics  . Smoking status: Never Smoker   . Smokeless tobacco: Not on file  . Alcohol Use: No  . Drug Use: No  . Sexual Activity: Not on file   Other Topics Concern  . Not on file   Social History Narrative    Family History  Problem Relation Age of Onset  . CVA Sister   . Heart attack Brother     PHYSICAL EXAM: Filed Vitals:   01/11/16 0500 01/11/16 0600  BP: 108/56 115/56  Pulse: 86 51  Temp:    Resp: 14 18     Intake/Output Summary (Last 24 hours) at 01/11/16 1038 Last data filed at 01/11/16 0950  Gross per 24 hour  Intake 656.72 ml  Output     50 ml  Net 606.72 ml    General:  Well appearing. No respiratory difficulty HEENT: normal Neck: supple. no JVD. Carotids 2+ bilat; no bruits. No lymphadenopathy or thryomegaly appreciated. Cor: PMI nondisplaced. Regular rate & rhythm. No rubs, gallops or murmurs. Lungs: clear Abdomen: soft, nontender, nondistended. No hepatosplenomegaly. No bruits or masses. Good bowel sounds. Extremities: no cyanosis, clubbing, rash, edema Neuro: alert & oriented x 3, cranial nerves grossly intact. moves all 4 extremities w/o difficulty. Affect pleasant.  ECG: Atrial fibrillation with rapid ventricular response rate  Results for orders placed or performed during the hospital encounter of 01/09/16 (from the past 24 hour(s))  Basic metabolic panel     Status: Abnormal   Collection Time: 01/11/16  6:11 AM  Result Value Ref Range   Sodium 145 135 - 145 mmol/L   Potassium 3.4 (L) 3.5 - 5.1 mmol/L   Chloride 113 (H) 101 - 111 mmol/L   CO2 29 22 - 32 mmol/L   Glucose, Bld 123 (H) 65 - 99 mg/dL   BUN 26 (H) 6 - 20 mg/dL   Creatinine, Ser 4.09 (H) 0.44 - 1.00 mg/dL   Calcium  7.9 (L) 8.9 - 10.3 mg/dL   GFR calc non Af Amer 40 (L) >60 mL/min   GFR calc Af Amer 47 (L) >60 mL/min   Anion gap 3 (L) 5 - 15  CBC     Status:  Abnormal   Collection Time: 01/11/16  6:11 AM  Result Value Ref Range   WBC 6.2 3.6 - 11.0 K/uL   RBC 3.18 (L) 3.80 - 5.20 MIL/uL   Hemoglobin 9.7 (L) 12.0 - 16.0 g/dL   HCT 09.8 (L) 11.9 - 14.7 %   MCV 92.9 80.0 - 100.0 fL   MCH 30.6 26.0 - 34.0 pg   MCHC 32.9 32.0 - 36.0 g/dL   RDW 82.9 (H) 56.2 - 13.0 %   Platelets 118 (L) 150 - 440 K/uL  Magnesium     Status: None   Collection Time: 01/11/16  6:11 AM  Result Value Ref Range   Magnesium 1.7 1.7 - 2.4 mg/dL   Dg Chest Port 1 View  01/10/2016  CLINICAL DATA:  82 year old female with shortness of breath and altered mental status. EXAM: PORTABLE CHEST 1 VIEW COMPARISON:  01/09/2016 and prior exam FINDINGS: Cardiomegaly and right PICC line with tip overlying the lower SVC again noted. New moderate interstitial pulmonary edema identified. Left basilar atelectasis/airspace disease and mild right basilar atelectasis identified. There is no evidence of pneumothorax. IMPRESSION: New moderate interstitial pulmonary edema. Left basilar atelectasis/airspace disease and mild right basilar atelectasis again noted. Electronically Signed   By: Harmon Pier M.D.   On: 01/10/2016 09:22   Dg Chest Port 1 View  01/09/2016  CLINICAL DATA:  82 year old female -evaluate PICC line placement. EXAM: PORTABLE CHEST 1 VIEW COMPARISON:  01/09/2016 FINDINGS: Cardiomegaly again identified. Mild left basilar opacity is unchanged. A right-sided PICC line is now identified with tip overlying the lower SVC. There is no evidence of pneumothorax. No other significant changes are present. IMPRESSION: Right PICC line with tip overlying the lower SVC. Continued left basilar opacity. Electronically Signed   By: Harmon Pier M.D.   On: 01/09/2016 15:13     ASSESSMENT AND PLAN: Atrial fibrillation with rapid ventricular response  rate/pulmonary edema/sepsis all may have contributed to her to mental status. Patient is currently a DO NOT RESUSCITATE. Agree with patient getting metoprolol may add Cardizem if blood pressure can tolerate.  KHAN,SHAUKAT A

## 2016-01-11 NOTE — Progress Notes (Signed)
Pt had to be restarted on levophed for low blood pressure. Continue in afib, rate in the 100s this shift. Alert only to name and family. Knows she is in the hospital but confused to date, time and situation. Does have history of dementia. Continue on IV ABX, pt has UTI and is septic. CXR showed pulmonary edema. Pt still incontinent, did not call out to use bedpan as she did on day shift.

## 2016-01-11 NOTE — Progress Notes (Signed)
[facility] - [place] at [facility]   PATIENT NAME: Jill House    MR#:  [id number]  DATE OF BIRTH:  08/10/1924  SUBJECTIVE:  CHIEF COMPLAINT:   Chief Complaint  Patient presents with  . Altered Mental Status   the patient is alert awake, 2. She is back on Levophed drip due to hypotension this am. She had cough, sputum,  still tachycardia at 130-140, and dyspnea is better.  REVIEW OF SYSTEMS:  CONSTITUTIONAL: No fever, has generalized weakness.  EYES: No blurred or double vision.  EARS, NOSE, AND THROAT: No tinnitus or ear pain.  RESPIRATORY: Has cough and shortness of breath, no wheezing or hemoptysis.  CARDIOVASCULAR: No chest pain, orthopnea, edema.  GASTROINTESTINAL: No nausea, vomiting, diarrhea or abdominal pain.  GENITOURINARY: No dysuria, hematuria.  ENDOCRINE: No polyuria, nocturia,  HEMATOLOGY: No anemia, easy bruising or bleeding SKIN: No rash or lesion. MUSCULOSKELETAL: No joint pain or arthritis.   NEUROLOGIC: No tingling, numbness, weakness.  PSYCHIATRY: No anxiety or depression.   DRUG ALLERGIES:   Allergies  Allergen Reactions  . Keflex [Cephalexin] Other (See Comments)    Reaction:  Tremors   . Ciprofloxacin Rash and Swelling    VITALS:  Blood pressure 115/56, pulse 51, temperature 98.3 F (36.8 C), temperature source Oral, resp. rate 18, height 5\' 2"  (1.575 m), weight 68.2 kg (150 lb 5.7 oz), SpO2 98 %.  PHYSICAL EXAMINATION:  GENERAL:  82 y.o.-year-old patient lying in the bed with no acute distress.  EYES: Pupils equal, round, reactive to light and accommodation. No scleral icterus. Extraocular muscles intact.  HEENT: Head atraumatic, normocephalic. Oropharynx and nasopharynx clear.  NECK:  Supple, no jugular venous distention. No thyroid enlargement, no tenderness.  LUNGS: Normal breath sounds bilaterally, no wheezing, no rales. No use of accessory muscles of respiration.  CARDIOVASCULAR: S1, S2 normal. No murmurs,  rubs, or gallops.  ABDOMEN: Soft, nontender, nondistended. Bowel sounds present. No organomegaly or mass.  EXTREMITIES: No pedal edema, cyanosis, or clubbing.  NEUROLOGIC: Cranial nerves II through XII are intact. Muscle strength 4/5 in all extremities. Sensation intact. Gait not checked.  PSYCHIATRIC: The patient is awake and demented, times x 2.  SKIN: No obvious rash, lesion, or ulcer.    LABORATORY PANEL:   CBC  Recent Labs Lab 01/11/16 0611  WBC 6.2  HGB 9.7*  HCT 29.5*  PLT 118*   ------------------------------------------------------------------------------------------------------------------  Chemistries   Recent Labs Lab 01/09/16 0728  01/11/16 0611  NA 146*  < > 145  K 5.1  < > 3.4*  CL 112*  < > 113*  CO2 27  < > 29  GLUCOSE 105*  < > 123*  BUN 31*  < > 26*  CREATININE 1.76*  < > 1.16*  CALCIUM 8.9  < > 7.9*  AST 235*  --   --   ALT 72*  --   --   ALKPHOS 172*  --   --   BILITOT 1.4*  --   --   < > = values in this interval not displayed. ------------------------------------------------------------------------------------------------------------------  Cardiac Enzymes  Recent Labs Lab 01/09/16 0728  TROPONINI 0.03   ------------------------------------------------------------------------------------------------------------------  RADIOLOGY:  Ct Abdomen Pelvis Wo Contrast  01/09/2016  CLINICAL DATA:  Fever of unknown origin. Transaminitis. Altered mental status. EXAM: CT ABDOMEN AND PELVIS WITHOUT CONTRAST TECHNIQUE: Multidetector CT imaging of the abdomen and pelvis was performed following the standard protocol without IV contrast. COMPARISON:  CT scan of the chest dated 08/22/2015 FINDINGS: Lower  chest: There is a small pericardial effusion, unchanged. Small bilateral pleural effusions, diminished since the prior study. Overall heart size is normal. Extensive coronary artery and aortic calcification. Hepatobiliary: Gallbladder has been removed. Liver  parenchyma and biliary tree are otherwise normal. Pancreas: Diffuse pancreatic atrophy. Spleen: Single tiny calcified granuloma in the spleen. Otherwise normal. Adrenals/Urinary Tract: Normal. Stomach/Bowel: No acute abnormalities. Multiple diverticula in the distal colon. Vascular/Lymphatic: Extensive aortic and iliac atherosclerosis. No adenopathy. Reproductive: Uterus and ovaries have been removed. Other: No free air or free fluid. Musculoskeletal: Left total hip prosthesis. Diffuse degenerative disc and joint disease in the lumbar spine with slight scoliosis. IMPRESSION: No acute abnormalities of the abdomen or pelvis. Small bilateral pleural effusions. Small chronic or recurrent pericardial effusion. Extensive aortic and coronary artery atherosclerosis. Diverticulosis of the distal colon. Electronically Signed   By: Francene Boyers M.D.   On: 01/09/2016 09:01   Ct Head Wo Contrast  01/09/2016  CLINICAL DATA:  Altered mental status today.  Initial encounter. EXAM: CT HEAD WITHOUT CONTRAST TECHNIQUE: Contiguous axial images were obtained from the base of the skull through the vertex without intravenous contrast. COMPARISON:  None. FINDINGS: There is mild atrophy and chronic microvascular ischemic change. No evidence of acute intracranial abnormality including hemorrhage, infarct, mass lesion, mass effect, midline shift or abnormal extra-axial fluid collection. No hydrocephalus or pneumocephalus. The calvarium is intact. Imaged paranasal sinuses and mastoid air cells are clear. IMPRESSION: No acute abnormality. Mild atrophy and chronic microvascular ischemic change. Electronically Signed   By: Drusilla Kanner M.D.   On: 01/09/2016 08:51   Dg Chest Port 1 View  01/10/2016  CLINICAL DATA:  82 year old female with shortness of breath and altered mental status. EXAM: PORTABLE CHEST 1 VIEW COMPARISON:  01/09/2016 and prior exam FINDINGS: Cardiomegaly and right PICC line with tip overlying the lower SVC again  noted. New moderate interstitial pulmonary edema identified. Left basilar atelectasis/airspace disease and mild right basilar atelectasis identified. There is no evidence of pneumothorax. IMPRESSION: New moderate interstitial pulmonary edema. Left basilar atelectasis/airspace disease and mild right basilar atelectasis again noted. Electronically Signed   By: Harmon Pier M.D.   On: 01/10/2016 09:22   Dg Chest Port 1 View  01/09/2016  CLINICAL DATA:  82 year old female -evaluate PICC line placement. EXAM: PORTABLE CHEST 1 VIEW COMPARISON:  01/09/2016 FINDINGS: Cardiomegaly again identified. Mild left basilar opacity is unchanged. A right-sided PICC line is now identified with tip overlying the lower SVC. There is no evidence of pneumothorax. No other significant changes are present. IMPRESSION: Right PICC line with tip overlying the lower SVC. Continued left basilar opacity. Electronically Signed   By: Harmon Pier M.D.   On: 01/09/2016 15:13    EKG:   Orders placed or performed during the hospital encounter of 01/09/16  . ED EKG 12-Lead  . ED EKG 12-Lead    ASSESSMENT AND PLAN:   Septic shock with UTI  Hypotension again. Put back on levophed drip. Discontinued normal saline IV due to dyspnea.  continue azactam, f/u blood and urine culture.   Acute metabolic encephalopathy. Improved. Baseline. Aspiration and fall Precaution.   ARF.  Improved, disontinued NS iv due to dyspnea .  Hypokalemia. KCl, f/u BMP and mag.  Chronic Afib RVR. resumed low dose lopressor, unable to give due to hypotension.  HR is 130-140 now, give digoxin iv one dose, cardiology consult. Continue eliquis.   Pulmonary edema (CXR) due to fluid overload. Lasix 20 mg 1 dose was given. Discontinued IV fluid.  Improved.   dementia. Aspiration and fall precaution.  PT consult. All the records are reviewed and case discussed with Care Management/Social Workerr. Management plans discussed with the patient's POA and they are in  agreement.  CODE STATUS: DO NOT RESUSCITATE  TOTAL CRITICAL TIME TAKING CARE OF THIS PATIENT: 45 minutes.  Greater than 50% time was spent on coordination of care and face-to-face counseling.  POSSIBLE D/C IN 3 DAYS, DEPENDING ON CLINICAL CONDITION.   Shaune Pollack M.D on 01/11/2016 at 8:07 AM  Between 7am to 6pm - Pager - [phone]  After 6pm go to [email or web address] - password [email or web address] [facility]  [facility]  Office  [phone]  CC: Primary care physician; Margaretann Loveless, MD

## 2016-01-11 NOTE — Progress Notes (Signed)
Pharmacy Antibiotic Note  Jill House is a 82 y.o. female admitted on 01/09/2016 with sepsis and UTI.  Pharmacy has been consulted for vancomycin and Zosyn dosing. Patient initially ordered aztreonam due to cephalosporin allergy, however reaction is tremor and patient received Zosyn in January.   Plan: Vancomycin 1000 mg once given in ED. Will continue with vancomycin 750 IV every 36 hours with stacked dosing 18 hours after initial dose.  Goal trough 15-20 mcg/mL. Will not order a trough at this time but will follow renal function closely and change dosing/order levels as clinically indicated.  Will begin Zosyn 3.375 g EI q 12 hours.   2/11: Scr improved. Zosyn changed to EI q8h. Vancomycin adjusted to  IV q36h. Will schedule trough prior to 4th dose on 2/13 at 2230.    Ke 0.026  T1/2 26.66 Vd 47.  Height:  (157.5 cm) Weight: 150 lb 5.7 oz (68.2 kg) IBW/kg (Calculated) : 50.1  Temp (24hrs), Avg:98.2 F (36.8 C), Min:98 F (36.7 C), Max:98.3 F (36.8 C)   Recent Labs Lab 01/06/16 0735 01/09/16 0728 01/09/16 1057 01/10/16 0654 01/11/16 0611  WBC 3.2* 8.9  --  10.8 6.2  CREATININE 1.17* 1.76*  --  1.14* 1.16*  LATICACIDVEN  --  1.2 1.7  --   --     Estimated Creatinine Clearance: 29.7 mL/min (by C-G formula based on Cr of 1.16).    Allergies  Allergen Reactions  . Keflex [Cephalexin] Other (See Comments)    Reaction:  Tremors   . Ciprofloxacin Rash and Swelling    Antimicrobials this admission: Aztreonam 02/10 >> 02/10 Vancomycin 02/10 >> Zosyn 02/10 >>  Dose adjustments this admission:   Microbiology results: Cultures pending 2/10 Bcx  2/10 Ucx- multiple species-recollect 2/10 MRSA PCR neg.  Thank you for allowing pharmacy to be a part of this patient's care.  Todd Jelinski A 01/11/2016 12:49 PM

## 2016-01-12 LAB — CBC
HEMATOCRIT: 28.4 % — AB (ref 35.0–47.0)
Hemoglobin: 9.2 g/dL — ABNORMAL LOW (ref 12.0–16.0)
MCH: 30.1 pg (ref 26.0–34.0)
MCHC: 32.3 g/dL (ref 32.0–36.0)
MCV: 93.3 fL (ref 80.0–100.0)
Platelets: 101 10*3/uL — ABNORMAL LOW (ref 150–440)
RBC: 3.05 MIL/uL — ABNORMAL LOW (ref 3.80–5.20)
RDW: 15.5 % — AB (ref 11.5–14.5)
WBC: 5.5 10*3/uL (ref 3.6–11.0)

## 2016-01-12 LAB — BASIC METABOLIC PANEL
Anion gap: 2 — ABNORMAL LOW (ref 5–15)
BUN: 19 mg/dL (ref 6–20)
CHLORIDE: 114 mmol/L — AB (ref 101–111)
CO2: 29 mmol/L (ref 22–32)
Calcium: 8.2 mg/dL — ABNORMAL LOW (ref 8.9–10.3)
Creatinine, Ser: 1 mg/dL (ref 0.44–1.00)
GFR calc Af Amer: 56 mL/min — ABNORMAL LOW (ref >60)
GFR, EST NON AFRICAN AMERICAN: 48 mL/min — AB (ref >60)
GLUCOSE: 76 mg/dL (ref 65–99)
POTASSIUM: 4.1 mmol/L (ref 3.5–5.1)
Sodium: 145 mmol/L (ref 135–145)

## 2016-01-12 MED ORDER — DILTIAZEM HCL 30 MG PO TABS
30.0000 mg | ORAL_TABLET | Freq: Four times a day (QID) | ORAL | Status: DC
  Administered 2016-01-12 – 2016-01-14 (×5): 30 mg via ORAL
  Filled 2016-01-12 (×6): qty 1

## 2016-01-12 MED ORDER — ALBUTEROL SULFATE (2.5 MG/3ML) 0.083% IN NEBU: 2.5000 mg | INHALATION_SOLUTION | RESPIRATORY_TRACT | Status: DC | PRN

## 2016-01-12 MED ORDER — METOPROLOL TARTRATE 25 MG PO TABS
25.0000 mg | ORAL_TABLET | Freq: Two times a day (BID) | ORAL | Status: DC
  Administered 2016-01-12 – 2016-01-14 (×4): 25 mg via ORAL
  Filled 2016-01-12 (×4): qty 1

## 2016-01-12 NOTE — Care Management (Signed)
Met with patient who is alert and knows she's in the hospital. She could not remember who her PCP is. She states she lives with her niece Judson Roch. She states she is independent with mobility usually. She is currently on room air. Patient has history of confusion noted. List of home health care agencies left with patient. PT ordered 01/11/16. RNCM to continue to follow. Transfer to floor care today.

## 2016-01-12 NOTE — Progress Notes (Signed)
SUBJECTIVE: Much more alert today   Filed Vitals:   01/12/16 0300 01/12/16 0400 01/12/16 0500 01/12/16 0600  BP: 113/68 116/70 110/65 117/60  Pulse: 83 66 66 76  Temp:      TempSrc:      Resp: Height:      Weight:      SpO2: 97% 96% 94% 96%    Intake/Output Summary (Last 24 hours) at 01/12/16 0900 Last data filed at 01/12/16 0545  Gross per 24 hour  Intake    508 ml  Output    450 ml  Net     58 ml    LABS: Basic Metabolic Panel:  Recent Labs  11/30/70 0611 01/12/16 0534  NA 145 145  K 3.4* 4.1  CL 113* 114*  CO2 29 29  GLUCOSE 123* 76  BUN 26* 19  CREATININE 1.16* 1.00  CALCIUM 7.9* 8.2*  MG 1.7  --    Liver Function Tests: No results for input(s): AST, ALT, ALKPHOS, BILITOT, PROT, ALBUMIN in the last 72 hours. No results for input(s): LIPASE, AMYLASE in the last 72 hours. CBC:  Recent Labs  01/11/16 0611 01/12/16 0534  WBC 6.2 5.5  HGB 9.7* 9.2*  HCT 29.5* 28.4*  MCV 92.9 93.3  PLT 118* 101*   Cardiac Enzymes: No results for input(s): CKTOTAL, CKMB, CKMBINDEX, TROPONINI in the last 72 hours. BNP: Invalid input(s): POCBNP D-Dimer: No results for input(s): DDIMER in the last 72 hours. Hemoglobin A1C: No results for input(s): HGBA1C in the last 72 hours. Fasting Lipid Panel: No results for input(s): CHOL, HDL, LDLCALC, TRIG, CHOLHDL, LDLDIRECT in the last 72 hours. Thyroid Function Tests: No results for input(s): TSH, T4TOTAL, T3FREE, THYROIDAB in the last 72 hours.  Invalid input(s): FREET3 Anemia Panel: No results for input(s): VITAMINB12, FOLATE, FERRITIN, TIBC, IRON, RETICCTPCT in the last 72 hours.   PHYSICAL EXAM General: Well developed, well nourished, in no acute distress HEENT:  Normocephalic and atramatic Neck:  No JVD.  Lungs: Clear bilaterally to auscultation and percussion. Heart: HRRR . Normal S1 and S2 without gallops or murmurs.  Abdomen: Bowel sounds are positive, abdomen soft and non-tender  Msk:  Back  normal, normal gait. Normal strength and tone for age. Extremities: No clubbing, cyanosis or edema.   Neuro: Alert and oriented X 3. Psych:  Good affect, responds appropriately  TELEMETRY: A. fib with rate 84 bpm  ASSESSMENT AND PLAN: Atrial fibrillation with controlled ventricular response rate. Patient also has U urosepsis for which she is recovering.  Active Problems:   Septic shock (HCC)   Pressure ulcer    Jema Deegan A, MD, [facility] 01/12/2016 9:00 AM

## 2016-01-12 NOTE — Progress Notes (Signed)
Patient to room 109 with cardiac monitor on and on floor bed. Asher Muir with transport taking her.

## 2016-01-12 NOTE — Progress Notes (Signed)
Pharmacy Antibiotic Note  Jill House is a 82 y.o. female admitted on 01/09/2016 with sepsis and UTI.  Pharmacy has been consulted for vancomycin and Zosyn dosing. Patient initially ordered aztreonam due to cephalosporin allergy, however reaction is tremor and patient received Zosyn in January.   Plan: Will continue vancomycin 1000 mg iv q 36 hours. Trough is scheduled with the 4th dose tonight which will not be at steady-state. Also, dose was changed from 750 mg iv q 36 hours to 1000 mg iv q 36 hours due to improvement in renal function. Will need to interpret vancomycin trough accordingly.   Continue Zosyn 3.375 g EI q 8 hours.    Height:  (157.5 cm) Weight: 150 lb 5.7 oz (68.2 kg) IBW/kg (Calculated) : 50.1  Temp (24hrs), Avg:97.9 F (36.6 C), Min:97.6 F (36.4 C), Max:98.2 F (36.8 C)   Recent Labs Lab 01/06/16 0735 01/09/16 0728 01/09/16 1057 01/10/16 0654 01/11/16 0611 01/12/16 0534  WBC 3.2* 8.9  --  10.8 6.2 5.5  CREATININE 1.17* 1.76*  --  1.14* 1.16* 1.00  LATICACIDVEN  --  1.2 1.7  --   --   --     Estimated Creatinine Clearance: 34.5 mL/min (by C-G formula based on Cr of 1).    Allergies  Allergen Reactions  . Keflex [Cephalexin] Other (See Comments)    Reaction:  Tremors   . Ciprofloxacin Rash and Swelling    Antimicrobials this admission: Aztreonam 02/10 >> 02/10 Vancomycin 02/10 >> Zosyn 02/10 >>  Dose adjustments this admission:   Microbiology results: 2/10 Bcx: NGTD 2/10 Ucx- multiple species-recollect 2/10 MRSA PCR neg.  Thank you for allowing pharmacy to be a part of this patient's care.  Valentina Gu 01/12/2016 12:37 PM

## 2016-01-12 NOTE — Progress Notes (Signed)
Report given to [facility] on 1C, patient going to room 109. All questions answered. Waiting for bed and monitor.  Patient without needs at this time.

## 2016-01-12 NOTE — Progress Notes (Signed)
Pt blood pressure has been sustaining above 100 systolic this shift. Levophed has been off since 1400 yesterday. Pt more alert and oriented this shift. Confused to time and situation, does not know why she was admitted. Continue on IV ABX for UTI. Plan for transfer to floor today. Remains on RA and maintaining sats in mid to high 90s.

## 2016-01-12 NOTE — Progress Notes (Signed)
Physical Therapy Treatment Patient Details Name: Jill House MRN: [id number] DOB: 11-17-1924 Today's Date: 01/12/2016    History of Present Illness Pt is an 82 y/o female here with septic shock.  PMH includes dementia and a-fib.      PT Comments    Pt stood 2x's with assist in order to change linens on bed (d/t incontinence) but pt's HR noted to increase from 70's (bpm at rest) to 130's to upper 140's (bpm) with standing.  D/t significant elevation in HR with activity pt's session ended and pt assisted back into bed and HR gradually decreasing to lower 100's.  Nursing notified immediately regarding HR vitals and significant elevation in HR with activity.  Pt does not appear safe to discharge home.  Recommend pt discharge to STR when medically appropriate.   Follow Up Recommendations  SNF     Equipment Recommendations       Recommendations for Other Services       Precautions / Restrictions Precautions Precautions: Fall Precaution Comments: Monitor HR Restrictions Weight Bearing Restrictions: No    Mobility  Bed Mobility Overal bed mobility: Needs Assistance Bed Mobility: Supine to Sit;Sit to Supine     Supine to sit: Min assist;Mod assist Sit to supine: Mod assist;+2 for physical assistance   General bed mobility comments: Increased effort to sit up on edge of bed noted; 2 assist to assist pt laying down into bed d/t elevated HR  Transfers Overall transfer level: Needs assistance   Transfers: Sit to/from Stand Sit to Stand: Mod assist;Max assist         General transfer comment: B knees blocked; limited time standing d/t fatigue (pt stood 2x's to change sheets on bed d/t incontinence)  Ambulation/Gait             General Gait Details: deferred d/t significant increased HR with standing   Stairs            Wheelchair Mobility    Modified Rankin (Stroke Patients Only)       Balance Overall balance assessment: Needs  assistance Sitting-balance support: Bilateral upper extremity supported;Feet supported Sitting balance-Leahy Scale: Fair                              Cognition Arousal/Alertness: Awake/alert Behavior During Therapy: WFL for tasks assessed/performed Overall Cognitive Status: History of cognitive impairments - at baseline (PMH of dementia; some confusion noted)                      Exercises      General Comments   Nursing cleared pt for participation in physical therapy.  Pt agreeable to PT session.      Pertinent Vitals/Pain Pain Assessment: No/denies pain    Home Living                      Prior Function            PT Goals (current goals can now be found in the care plan section) Acute Rehab PT Goals Patient Stated Goal: Pt wants to get stronger PT Goal Formulation: With patient Time For Goal Achievement: 01/25/16 Potential to Achieve Goals: Fair Progress towards PT goals: Progressing toward goals    Frequency  Min 2X/week    PT Plan Current plan remains appropriate    Co-evaluation             End of Session Equipment Utilized  During Treatment: Gait belt Activity Tolerance: Other (comment) (Limited d/t significant HR increase with activity) Patient left: in bed;with call bell/phone within reach;with bed alarm set     Time: 1610-9604 PT Time Calculation (min) (ACUTE ONLY): 17 min  Charges:  $Therapeutic Activity: 8-22 mins                    G CodesHendricks Limes 2016-01-15, 5:01 PM Hendricks Limes, PT [phone]

## 2016-01-12 NOTE — [place] FL2 (Signed)
[place] MEDICAID FL2 LEVEL OF CARE SCREENING TOOL     IDENTIFICATION  Patient Name: Jill House Birthdate: [birth year] Sex: female Admission Date (Current Location): 01/09/2016  [place] and [place] Number:  Chiropodist and Address:  [facility], [address], [place], [place] [address]      Provider Number: [id number]  Attending Physician Name and Address:  Shaune Pollack, MD  Relative Name and Phone Number:       Current Level of Care: Hospital Recommended Level of Care: Skilled Nursing Facility Prior Approval Number:    Date Approved/Denied:   PASRR Number: [id number]  Discharge Plan: SNF    Current Diagnoses: Patient Active Problem List   Diagnosis Date Noted  . Septic shock (HCC) 01/09/2016  . Pressure ulcer 01/09/2016  . Malnutrition of moderate degree 12/18/2015  . Sepsis (HCC) 12/15/2015  . UTI (lower urinary tract infection) 12/15/2015  . Hip fracture (HCC) 08/13/2015    Orientation RESPIRATION BLADDER Height & Weight     Self, Time, Situation, Place  O2 (3L) Incontinent Weight: 148 lb 6.4 oz (67.314 kg) Height:   (165.1 cm)  BEHAVIORAL SYMPTOMS/MOOD NEUROLOGICAL BOWEL NUTRITION STATUS      Continent Diet (Soft Diet)  AMBULATORY STATUS COMMUNICATION OF NEEDS Skin   Extensive Assist   Normal                       Personal Care Assistance Level of Assistance  Bathing, Feeding, Dressing Bathing Assistance: Limited assistance Feeding assistance: Independent Dressing Assistance: Limited assistance     Functional Limitations Info  Sight, Hearing, Speech Sight Info: Adequate Hearing Info: Adequate Speech Info: Adequate    SPECIAL CARE FACTORS FREQUENCY  PT (By licensed PT)     PT Frequency: 5              Contractures      Additional Factors Info  Code Status, Allergies, Psychotropic Code Status Info: DNR Allergies Info: Keflex, Ciprofloxacin Psychotropic Info: Medication          Current Medications (01/12/2016):  This is the current hospital active medication list Current Facility-Administered Medications  Medication Dose Route Frequency Provider Last Rate Last Dose  . acetaminophen (TYLENOL) tablet 650 mg  650 mg Oral Q6H PRN Shaune Pollack, MD       Or  . acetaminophen (TYLENOL) suppository 650 mg  650 mg Rectal Q6H PRN Shaune Pollack, MD      . albuterol (PROVENTIL) (2.5 MG/3ML) 0.083% nebulizer solution 2.5 mg  2.5 mg Nebulization Q4H PRN Shaune Pollack, MD      . alum & mag hydroxide-simeth (MAALOX/MYLANTA) 200-200-20 MG/5ML suspension 30 mL  30 mL Oral Q6H PRN Shaune Pollack, MD      . apixaban [facility]) tablet 2.5 mg  2.5 mg Oral Q12H Shaune Pollack, MD   2.5 mg at 01/12/16 1041  . bisacodyl (DULCOLAX) EC tablet 10 mg  10 mg Oral Daily PRN Shaune Pollack, MD      . calcium carbonate (TUMS - dosed in mg elemental calcium) chewable tablet 200 mg of elemental calcium  1 tablet Oral BID Shaune Pollack, MD   200 mg of elemental calcium at 01/12/16 1042  . diltiazem (CARDIZEM) tablet 30 mg  30 mg Oral 4 times per day Shaune Pollack, MD      . donepezil (ARICEPT) tablet 10 mg  10 mg Oral Daily Shaune Pollack, MD   10 mg at 01/12/16 1041  . ferrous sulfate tablet  325 mg  325 mg Oral BID WC Shaune Pollack, MD   325 mg at 01/12/16 1518  . gabapentin (NEURONTIN) capsule 300 mg  300 mg Oral TID Shaune Pollack, MD   300 mg at 01/12/16 1518  . memantine (NAMENDA XR) 24 hr capsule 28 mg  28 mg Oral Daily Shaune Pollack, MD   28 mg at 01/12/16 1041  . metoprolol tartrate (LOPRESSOR) tablet 25 mg  25 mg Oral BID Shaune Pollack, MD      . mirtazapine (REMERON) tablet 7.5 mg  7.5 mg Oral QHS Shaune Pollack, MD   7.5 mg at 01/11/16 2140  . ondansetron (ZOFRAN) tablet 4 mg  4 mg Oral Q6H PRN Shaune Pollack, MD       Or  . ondansetron [facility]) injection 4 mg  4 mg Intravenous Q6H PRN Shaune Pollack, MD      . oxybutynin (DITROPAN) tablet 2.5 mg  2.5 mg Oral TID Shaune Pollack, MD   2.5 mg at 01/12/16 1518  . pantoprazole (PROTONIX) EC tablet 40 mg  40 mg Oral  Daily Shaune Pollack, MD   40 mg at 01/12/16 1041  . piperacillin-tazobactam (ZOSYN) IVPB 3.375 g  3.375 g Intravenous 3 times per day Sherry Ruffing, RPH   3.375 g at 01/12/16 1519  . pravastatin (PRAVACHOL) tablet 40 mg  40 mg Oral QHS Shaune Pollack, MD   40 mg at 01/11/16 2139  . senna-docusate (Senokot-S) tablet 1 tablet  1 tablet Oral QHS PRN Shaune Pollack, MD      . sodium chloride 0.9 % bolus 500 mL  500 mL Intravenous Once Shaune Pollack, MD   500 mL at 01/09/16 1220     Discharge Medications: Please see discharge summary for a list of discharge medications.  Relevant Imaging Results:  Relevant Lab Results:   Additional Information SSN:  [id number]  Dede Query, LCSW

## 2016-01-12 NOTE — Progress Notes (Addendum)
[facility]icians - [place] at [facility]   PATIENT NAME: Jill House    MR#:  [id number]  DATE OF BIRTH:  18-Sep-1924  SUBJECTIVE:  CHIEF COMPLAINT:   Chief Complaint  Patient presents with  . Altered Mental Status  Off Levophed drip, no complaint. HR 80-110.  REVIEW OF SYSTEMS:  CONSTITUTIONAL: No fever, has generalized weakness.  EYES: No blurred or double vision.  EARS, NOSE, AND THROAT: No tinnitus or ear pain.  RESPIRATORY: Has cough and shortness of breath, no wheezing or hemoptysis.  CARDIOVASCULAR: No chest pain, orthopnea, edema.  GASTROINTESTINAL: No nausea, vomiting, diarrhea or abdominal pain.  GENITOURINARY: No dysuria, hematuria.  ENDOCRINE: No polyuria, nocturia,  HEMATOLOGY: No anemia, easy bruising or bleeding SKIN: No rash or lesion. MUSCULOSKELETAL: No joint pain or arthritis.   NEUROLOGIC: No tingling, numbness, weakness.  PSYCHIATRY: No anxiety or depression.   DRUG ALLERGIES:   Allergies  Allergen Reactions  . Keflex [Cephalexin] Other (See Comments)    Reaction:  Tremors   . Ciprofloxacin Rash and Swelling    VITALS:  Blood pressure 143/72, pulse 85, temperature 97.9 F (36.6 C), temperature source Oral, resp. rate 28, height  (1.575 m), weight 68.2 kg (150 lb 5.7 oz), SpO2 95 %.  PHYSICAL EXAMINATION:  GENERAL:  82 y.o.-year-old patient lying in the bed with no acute distress.  EYES: Pupils equal, round, reactive to light and accommodation. No scleral icterus. Extraocular muscles intact.  HEENT: Head atraumatic, normocephalic. Oropharynx and nasopharynx clear.  NECK:  Supple, no jugular venous distention. No thyroid enlargement, no tenderness.  LUNGS: Normal breath sounds bilaterally, no wheezing, no rales. No use of accessory muscles of respiration.  CARDIOVASCULAR: S1, S2 normal. No murmurs, rubs, or gallops.  ABDOMEN: Soft, nontender, nondistended. Bowel sounds present. No organomegaly or mass.  EXTREMITIES:  No pedal edema, cyanosis, or clubbing.  NEUROLOGIC: Cranial nerves II through XII are intact. Muscle strength 4/5 in all extremities. Sensation intact. Gait not checked.  PSYCHIATRIC: The patient is awake and demented, times x 2.  SKIN: No obvious rash, lesion, or ulcer.    LABORATORY PANEL:   CBC  Recent Labs Lab 01/12/16 0534  WBC 5.5  HGB 9.2*  HCT 28.4*  PLT 101*   ------------------------------------------------------------------------------------------------------------------  Chemistries   Recent Labs Lab 01/09/16 0728  01/11/16 0611 01/12/16 0534  NA 146*  < > 145 145  K 5.1  < > 3.4* 4.1  CL 112*  < > 113* 114*  CO2 27  < > 29 29  GLUCOSE 105*  < > 123* 76  BUN 31*  < > 26* 19  CREATININE 1.76*  < > 1.16* 1.00  CALCIUM 8.9  < > 7.9* 8.2*  MG  --   --  1.7  --   AST 235*  --   --   --   ALT 72*  --   --   --   ALKPHOS 172*  --   --   --   BILITOT 1.4*  --   --   --   < > = values in this interval not displayed. ------------------------------------------------------------------------------------------------------------------  Cardiac Enzymes  Recent Labs Lab 01/09/16 0728  TROPONINI 0.03   ------------------------------------------------------------------------------------------------------------------  RADIOLOGY:  No results found.  EKG:   Orders placed or performed during the hospital encounter of 01/09/16  . ED EKG 12-Lead  . ED EKG 12-Lead    ASSESSMENT AND PLAN:   Septic shock with UTI  Hypotension improved. Off levophed drip.  Discontinued normal saline IV due to dyspnea.  She was treated with azactam, negative blood and urine culture MULTIPLE SPECIES PRESENT, SUGGEST RECOLLECTION.  She has been treated with vanco and zosyn. vanco is discontinued.  Acute metabolic encephalopathy. Improved. Baseline. Aspiration and fall Precaution.   ARF.  Improved with NS iv.  Hypokalemia. Improved with KCl.  Chronic Afib RVR. Since BP is much  better, resume home dose lopressor 25 mg bid. HR is better controlled after given digoxin iv one dose. Continue eliquis. May add cardizem if BP allows per Dr. Welton Flakes.  Pulmonary edema (CXR) due to fluid overload. Lasix 20 mg 1 dose was given. Discontinued IV fluid. Improved.   dementia. Aspiration and fall precaution.   PT consult. All the records are reviewed and case discussed with Care Management/Social Workerr. Management plans discussed with the patient's POA and they are in agreement.  CODE STATUS: DO NOT RESUSCITATE  TOTAL TIME TAKING CARE OF THIS PATIENT: 39 minutes.  Greater than 50% time was spent on coordination of care and face-to-face counseling.  POSSIBLE D/C IN 2 DAYS, DEPENDING ON CLINICAL CONDITION.   Shaune Pollack M.D on 01/12/2016 at 12:37 PM  Between 7am to 6pm - Pager - [phone]  After 6pm go to [email or web address] - password [email or web address] [facility]  [place] [facility]  Office  [phone]  CC: Primary care physician; Margaretann Loveless, MD

## 2016-01-12 NOTE — Progress Notes (Signed)
Transfer received from ICU, pt oriented to room

## 2016-01-12 NOTE — Clinical Documentation Improvement (Signed)
Cardiology  Can the diagnosis of pressure ulcer be further specified?   Document if pressure ulcer with stage is Present on Admission   Document Site with laterality - Elbow, Back (upper/lower), Sacral, Hip, Buttock, Ankle, Heel, Head, Other (Specify)  Pressure Ulcer Stage - Stage1, Stage 2, Stage 3, Stage 4, Unstageable, Unspecified, Unable to Clinically Determine  Other  Clinically Undetermined  Please update your documentation within the medical record to reflect your response to this query. Thank you.  Supporting Information:  (As per notes) " Pressure Ulcer"  Please exercise your independent, professional judgment when responding. A specific answer is not anticipated or expected.  Thank You, Nevin Bloodgood, RN, BSN, CCDS,Clinical Documentation Specialist:  [phone]  [phone]=[facility]- Health Information Management

## 2016-01-13 MED ORDER — LORAZEPAM 2 MG/ML IJ SOLN
1.0000 mg | Freq: Four times a day (QID) | INTRAMUSCULAR | Status: DC | PRN
  Administered 2016-01-13: 1 mg via INTRAVENOUS
  Filled 2016-01-13: qty 1

## 2016-01-13 MED ORDER — ENSURE ENLIVE PO LIQD
237.0000 mL | Freq: Three times a day (TID) | ORAL | Status: DC
  Administered 2016-01-13 – 2016-01-14 (×4): 237 mL via ORAL

## 2016-01-13 MED ORDER — HALOPERIDOL LACTATE 5 MG/ML IJ SOLN
1.0000 mg | Freq: Four times a day (QID) | INTRAMUSCULAR | Status: DC | PRN
  Administered 2016-01-13: 17:00:00 1 mg via INTRAVENOUS
  Filled 2016-01-13: qty 1

## 2016-01-13 NOTE — Progress Notes (Signed)
Initial Nutrition Assessment  DOCUMENTATION CODES:   Non-severe (moderate) malnutrition in context of chronic illness  INTERVENTION:   Meals and Snacks: Cater to patient preferences Medical Food Supplement Therapy: will recommend Ensure Enlive po TID, each supplement provides 350 kcal and 20 grams of protein   NUTRITION DIAGNOSIS:   Malnutrition related to chronic illness as evidenced by mild depletion of body fat, moderate depletions of muscle mass, mild depletion of muscle mass.  GOAL:   Patient will meet greater than or equal to 90% of their needs  MONITOR:    (Energy Intake, Electrolyte and renal Profile, Anthropometrics, Digestive system, Skin)  REASON FOR ASSESSMENT:   Consult Poor PO  ASSESSMENT:   Pt admitted with septic shock secondary to UTI, with acute renal failure and pulmonary edema. Pt with recent admission, 10 days PTA for the same.  Past Medical History  Diagnosis Date  . Atrial fibrillation (HCC)   . Dementia   . Hip joint replacement status   . Knee joint replacement status   . Osteoarthritis   . Hypertension   . Urinary incontinence      Diet Order:  DIET SOFT Room service appropriate?: Yes; Fluid consistency:: Thin    Current Nutrition: Pt reports eating rice kripsies cereal this am. Pt kept banana from tray to eat later. Recorded po intake <50% of meals since admission (day 4).   Food/Nutrition-Related History: Pt reports a good appetite PTA, eating 3 meals per day. However on last admission 2 weeks ago, pt reported usually eating 2 meals per day. Pt seems somewhat confused at times during visit, however answering questions appropriately. RD notes pt on remeron this admission and notably on last admission as well. Pt reports 'having a case of products' that she thinks is Ensure. Per chart review, pt from [facility] PTA.   Scheduled Medications:  . apixaban  2.5 mg Oral Q12H  . calcium carbonate  1 tablet Oral BID  . diltiazem  30 mg Oral 4  times per day  . donepezil  10 mg Oral Daily  . feeding supplement (ENSURE ENLIVE)  237 mL Oral TID WC  . ferrous sulfate  325 mg Oral BID WC  . gabapentin  300 mg Oral TID  . memantine  28 mg Oral Daily  . metoprolol tartrate  25 mg Oral BID  . mirtazapine  7.5 mg Oral QHS  . oxybutynin  2.5 mg Oral TID  . pantoprazole  40 mg Oral Daily  . piperacillin-tazobactam (ZOSYN)  IV  3.375 g Intravenous 3 times per day  . pravastatin  40 mg Oral QHS  . sodium chloride  500 mL Intravenous Once    Electrolyte/Renal Profile and Glucose Profile:   Recent Labs Lab 01/10/16 0654 01/11/16 0611 01/12/16 0534  NA 146* 145 145  K 5.1 3.4* 4.1  CL 119* 113* 114*  CO2 19* 29 29  BUN 28* 26* 19  CREATININE 1.14* 1.16* 1.00  CALCIUM 7.7* 7.9* 8.2*  MG  --  1.7  --   GLUCOSE 92 123* 76   Protein Profile:  Recent Labs Lab 01/09/16 0728  ALBUMIN 3.3*    Gastrointestinal Profile: Last BM:  01/12/2016   Nutrition-Focused Physical Exam Findings: Nutrition-Focused physical exam completed. Findings are mild fat depletion, mild-moderate muscle depletion, and mild edema.     Weight Change: Pt reports weight has been in the '140's' Per [facility] weight encounters weight loss of 5% in 5 months   Skin:   (Stage I sacral pressure ulcer)  Height:   Ht Readings from Last 1 Encounters:  01/12/16  (1.651 m)    Weight:   Wt Readings from Last 1 Encounters:  01/12/16 148 lb 6.4 oz (67.314 kg)   Wt Readings from Last 10 Encounters:  01/12/16 148 lb 6.4 oz (67.314 kg)  12/15/15 148 lb 2.4 oz (67.2 kg)  11/08/15 150 lb 4.8 oz (68.176 kg)  08/13/15 156 lb 14.4 oz (71.169 kg)  11/12/14 145 lb (65.772 kg)    BMI:  Body mass index is 24.7 kg/(m^2).  Estimated Nutritional Needs:   Kcal:  BEE: 1050kcals, TEE: (IF 1.1-1.3)(AF 1.2) 1386-1638kcals  Protein:  74-87g protein (1.1-1.3g/kg)  Fluid:  1675-2069mL of fluid (25-71mL/kg)  EDUCATION NEEDS:   No education needs identified at this  time   MODERATE Care Level  Leda Quail, RD, LDN Pager [phone] Weekend/On-Call Pager [phone]

## 2016-01-13 NOTE — Clinical Social Work Note (Signed)
Clinical Social Work Assessment  Patient Details  Name: Jill House MRN: [id number] Date of Birth: 1924-03-17  Date of referral:  01/13/16               Reason for consult:  Facility Placement                Permission sought to share information with:  Family Supports Permission granted to share information::  Yes, Verbal Permission Granted  Name::     Robet Leu, nephew and POA   Housing/Transportation Living arrangements for the past 2 months:  [place] of Information:  Medical laboratory scientific officer Patient Interpreter Needed:  None Criminal Activity/Legal Involvement Pertinent to Current Situation/Hospitalization:  No - Comment as needed Significant Relationships:   Niece and Nephew Lives with:  Facility Resident Do you feel safe going back to the place where you live?  Yes Need for family participation in patient care:  Yes (Comment)  Care giving concerns:  No care giving concerns identified.    Social Worker assessment / plan:  CSW met with pt's nephew to address consult. Pt was admitted from [facility]. Pt has been at facility since Sept 2016. CSW explained the process of discharging and returning to [facility]. Pt's nephew would like for pt to returning to facility when medically stable. Pt's nephew expressed confusion about a bed home, which he has followed up with business office to address this. CSW updated facility of possible discharge tomorrow. Facility will be able to accept pt at discharge. CSW will continue to follow.   Employment status:  Retired Nurse, adult PT Recommendations:  [place] / Referral to community resources:  [place]  Patient/Family's Response to care:  Pt's nephew was Patent attorney of CSW support.   Patient/Family's Understanding of and Emotional Response to Diagnosis, Current Treatment, and Prognosis:  Pt's nephew understands that pt  needs  Emotional Assessment Appearance:  Appears stated age Attitude/Demeanor/Rapport:  Unable to Assess Affect (typically observed):  Unable to Assess Orientation:  Oriented to Self, Fluctuating Orientation (Suspected and/or reported Sundowners) Alcohol / Substance use:  Never Used Psych involvement (Current and /or in the community):  No (Comment)  Discharge Needs  Concerns to be addressed:  Adjustment to Illness Readmission within the last 30 days:  Yes Current discharge risk:  Chronically ill Barriers to Discharge:  Continued Medical Work up   [employer], LCSW 01/13/2016, 4:06 PM

## 2016-01-13 NOTE — Care Management Important Message (Signed)
Important Message  Patient Details  Name: Jill House MRN: [id number] Date of Birth: 09/07/1924   Medicare Important Message Given:  Yes    [name] 01/13/2016, 10:13 AM

## 2016-01-13 NOTE — Progress Notes (Signed)
SUBJECTIVE: comfortable, more alert   Filed Vitals:   01/13/16 0142 01/13/16 0143 01/13/16 0601 01/13/16 0751  BP: 133/73  138/62 121/64  Pulse: 67 74 58 74  Temp:   97.7 F (36.5 C)   TempSrc:   Oral   Resp:   16   Height:      Weight:      SpO2:   93% 94%    Intake/Output Summary (Last 24 hours) at 01/13/16 0915 Last data filed at 01/12/16 1800  Gross per 24 hour  Intake    460 ml  Output      0 ml  Net    460 ml    LABS: Basic Metabolic Panel:  Recent Labs  16/10/96 0611 01/12/16 0534  NA 145 145  K 3.4* 4.1  CL 113* 114*  CO2 29 29  GLUCOSE 123* 76  BUN 26* 19  CREATININE 1.16* 1.00  CALCIUM 7.9* 8.2*  MG 1.7  --    Liver Function Tests: No results for input(s): AST, ALT, ALKPHOS, BILITOT, PROT, ALBUMIN in the last 72 hours. No results for input(s): LIPASE, AMYLASE in the last 72 hours. CBC:  Recent Labs  01/11/16 0611 01/12/16 0534  WBC 6.2 5.5  HGB 9.7* 9.2*  HCT 29.5* 28.4*  MCV 92.9 93.3  PLT 118* 101*   Cardiac Enzymes: No results for input(s): CKTOTAL, CKMB, CKMBINDEX, TROPONINI in the last 72 hours. BNP: Invalid input(s): POCBNP D-Dimer: No results for input(s): DDIMER in the last 72 hours. Hemoglobin A1C: No results for input(s): HGBA1C in the last 72 hours. Fasting Lipid Panel: No results for input(s): CHOL, HDL, LDLCALC, TRIG, CHOLHDL, LDLDIRECT in the last 72 hours. Thyroid Function Tests: No results for input(s): TSH, T4TOTAL, T3FREE, THYROIDAB in the last 72 hours.  Invalid input(s): FREET3 Anemia Panel: No results for input(s): VITAMINB12, FOLATE, FERRITIN, TIBC, IRON, RETICCTPCT in the last 72 hours.   PHYSICAL EXAM General: Well developed, well nourished, in no acute distress HEENT:  Normocephalic and atramatic Neck:  No JVD.  Lungs: Clear bilaterally to auscultation and percussion. Heart: HRRR . Normal S1 and S2 without gallops or murmurs.  Abdomen: Bowel sounds are positive, abdomen soft and non-tender  Msk:  Back  normal, normal gait. Normal strength and tone for age. Extremities: No clubbing, cyanosis or edema.   Neuro: Alert and oriented X 3. Psych:  Good affect, responds appropriately  TELEMETRY: afib 70/min  ASSESSMENT AND PLAN: AFIB with RVR in setting of urosepsis. Rate well controlled now.  Active Problems:   Septic shock (HCC)   Pressure ulcer    Tomika Eckles A, MD, [facility] 01/13/2016 9:15 AM

## 2016-01-13 NOTE — Progress Notes (Addendum)
[facility] - [place] at [facility]   PATIENT NAME: Jill House    MR#:  [id number]  DATE OF BIRTH:  [birth year]  SUBJECTIVE:  CHIEF COMPLAINT:   Chief Complaint  Patient presents with  . Altered Mental Status  No complaint.  REVIEW OF SYSTEMS:  CONSTITUTIONAL: No fever, no weakness.  EYES: No blurred or double vision.  EARS, NOSE, AND THROAT: No tinnitus or ear pain.  RESPIRATORY: Has cough and shortness of breath, no wheezing or hemoptysis.  CARDIOVASCULAR: No chest pain, orthopnea, edema.  GASTROINTESTINAL: No nausea, vomiting, diarrhea or abdominal pain.  GENITOURINARY: No dysuria, hematuria.  ENDOCRINE: No polyuria, nocturia,  HEMATOLOGY: No anemia, easy bruising or bleeding SKIN: No rash or lesion. MUSCULOSKELETAL: No joint pain or arthritis.   NEUROLOGIC: No tingling, numbness, weakness.  PSYCHIATRY: No anxiety or depression.   DRUG ALLERGIES:   Allergies  Allergen Reactions  . Keflex [Cephalexin] Other (See Comments)    Reaction:  Tremors   . Ciprofloxacin Rash and Swelling    VITALS:  Blood pressure 129/111, pulse 83, temperature 98 F (36.7 C), temperature source Oral, resp. rate 18, height  (1.651 m), weight 67.314 kg (148 lb 6.4 oz), SpO2 93 %.  PHYSICAL EXAMINATION:  GENERAL:  82 y.o.-year-old patient lying in the bed with no acute distress.  EYES: Pupils equal, round, reactive to light and accommodation. No scleral icterus. Extraocular muscles intact.  HEENT: Head atraumatic, normocephalic. Oropharynx and nasopharynx clear.  NECK:  Supple, no jugular venous distention. No thyroid enlargement, no tenderness.  LUNGS: Normal breath sounds bilaterally, no wheezing, no rales. No use of accessory muscles of respiration.  CARDIOVASCULAR: S1, S2 normal. No murmurs, rubs, or gallops.  ABDOMEN: Soft, nontender, nondistended. Bowel sounds present. No organomegaly or mass.  EXTREMITIES: No pedal edema, cyanosis, or clubbing. Left  arm mild redness. NEUROLOGIC: Cranial nerves II through XII are intact. Muscle strength 4/5 in all extremities. Sensation intact. Gait not checked.  PSYCHIATRIC: The patient is awake and demented, times x 2.  SKIN: No obvious rash, lesion, Sacral DU stage 1.   LABORATORY PANEL:   CBC  Recent Labs Lab 01/12/16 0534  WBC 5.5  HGB 9.2*  HCT 28.4*  PLT 101*   ------------------------------------------------------------------------------------------------------------------  Chemistries   Recent Labs Lab 01/09/16 0728  01/11/16 0611 01/12/16 0534  NA 146*  < > 145 145  K 5.1  < > 3.4* 4.1  CL 112*  < > 113* 114*  CO2 27  < > 29 29  GLUCOSE 105*  < > 123* 76  BUN 31*  < > 26* 19  CREATININE 1.76*  < > 1.16* 1.00  CALCIUM 8.9  < > 7.9* 8.2*  MG  --   --  1.7  --   AST 235*  --   --   --   ALT 72*  --   --   --   ALKPHOS 172*  --   --   --   BILITOT 1.4*  --   --   --   < > = values in this interval not displayed. ------------------------------------------------------------------------------------------------------------------  Cardiac Enzymes  Recent Labs Lab 01/09/16 0728  TROPONINI 0.03   ------------------------------------------------------------------------------------------------------------------  RADIOLOGY:  No results found.  EKG:   Orders placed or performed during the hospital encounter of 12/15/15  . ED EKG 12-Lead  . ED EKG 12-Lead  . ED EKG  . ED EKG  . EKG 12-Lead  . EKG 12-Lead    ASSESSMENT  AND PLAN:   Septic shock with UTI  Hypotension improved. Off levophed drip. Discontinued normal saline IV due to dyspnea.  She was treated with azactam, negative blood and urine culture MULTIPLE SPECIES PRESENT, SUGGEST RECOLLECTION.  She has been treated with vanco and zosyn. vanco is discontinued.  Acute metabolic encephalopathy. Improved. Baseline. Aspiration and fall Precaution.   ARF.  Improved with NS iv.  Hypokalemia. Improved with  KCl.  Chronic Afib RVR. Since BP is much better, resumed home dose lopressor 25 mg bid. HR is controlled after given digoxin iv one dose. Continue eliquis.Added cardizem.  Pulmonary edema (CXR) due to fluid overload. Lasix 20 mg 1 dose was given. Discontinued IV fluid. Improved.   dementia. Aspiration and fall precaution. Sacral DU stage 1.    PT consult: SNF. All the records are reviewed and case discussed with Care Management/Social Workerr. Management plans discussed with the patient's POA and they are in agreement.  CODE STATUS: DO NOT RESUSCITATE  TOTAL TIME TAKING CARE OF THIS PATIENT: 33 minutes.  Greater than 50% time was spent on coordination of care and face-to-face counseling.  POSSIBLE D/C IN 1-2 DAYS, DEPENDING ON CLINICAL CONDITION.   Shaune Pollack M.D on 01/13/2016 at 2:23 PM  Between 7am to 6pm - Pager - [phone]  After 6pm go to [email or web address] - password [email or web address] [facility]  [place] [facility]  Office  [phone]  CC: Primary care physician; Margaretann Loveless, MD

## 2016-01-14 LAB — CULTURE, BLOOD (ROUTINE X 2)
CULTURE: NO GROWTH
CULTURE: NO GROWTH

## 2016-01-14 MED ORDER — DILTIAZEM HCL ER COATED BEADS 120 MG PO CP24
120.0000 mg | ORAL_CAPSULE | Freq: Every day | ORAL | Status: DC
  Administered 2016-01-14: 120 mg via ORAL
  Filled 2016-01-14: qty 1

## 2016-01-14 MED ORDER — METOPROLOL TARTRATE 25 MG PO TABS: 25.0000 mg | ORAL_TABLET | Freq: Two times a day (BID) | ORAL | Status: AC

## 2016-01-14 MED ORDER — DILTIAZEM HCL ER COATED BEADS 120 MG PO CP24: 120.0000 mg | ORAL_CAPSULE | Freq: Every day | ORAL | Status: AC

## 2016-01-14 MED ORDER — AMOXICILLIN-POT CLAVULANATE 875-125 MG PO TABS: 1.0000 | ORAL_TABLET | Freq: Two times a day (BID) | ORAL | Status: AC

## 2016-01-14 NOTE — Progress Notes (Signed)
Pt is being discharged to [facility] today, report called, belongings packed and given to pt.  Ems notified, just awaiting their arrival.

## 2016-01-14 NOTE — Progress Notes (Signed)
Spoke with Jill House, [facility] rep at [phone], to notify of non-emergent EMS transport.  Auth notification reference given as [id number].   Service date range good from 01/14/16 - 04/13/16.   Gap exception requested to determine if services can be considered at an in-network level.

## 2016-01-14 NOTE — Clinical Social Work Note (Signed)
Pt is ready for discharge today and will return to [facility]. Facility has received discharge information and is ready to admit pt. CSW notified pt's nephew, Marcy Salvo, who is aware and agreeable to discharge plan. RN will call report to room #219-B at #[phone]. [facility] will provide transportation. CSW is signing off as no further needs identified.   Dede Query, MSW, LCSW  Clinical Social Worker [phone]

## 2016-01-14 NOTE — Discharge Summary (Signed)
[facility] - [place] at [facility]   PATIENT NAME: Jill House    MR#:  [id number]  DATE OF BIRTH:  [birth year]  DATE OF ADMISSION:  01/09/2016 ADMITTING PHYSICIAN: Shaune Pollack, MD  DATE OF DISCHARGE: 01/14/2016 PRIMARY CARE PHYSICIAN: Margaretann Loveless, MD    ADMISSION DIAGNOSIS:  Acute kidney injury (nontraumatic) (HCC) [N17.9] Severe sepsis (HCC) [A41.9, R65.20] Fever, unspecified fever cause [R50.9] Hypotension, unspecified hypotension type [I95.9]   DISCHARGE DIAGNOSIS:  Septic shock with UTI  Acute metabolic encephalopathy ARF Chronic Afib RVR SECONDARY DIAGNOSIS:   Past Medical History  Diagnosis Date  . Atrial fibrillation (HCC)   . Dementia   . Hip joint replacement status   . Knee joint replacement status   . Osteoarthritis   . Hypertension   . Urinary incontinence     HOSPITAL COURSE:   Septic shock with UTI  Hypotension improved. Off levophed drip. Discontinued normal saline IV due to dyspnea.  She was treated with azactam, negative blood and urine culture MULTIPLE SPECIES PRESENT, SUGGEST RECOLLECTION.  She has been treated with vanco and zosyn. vanco is discontinued. Change to augmentin after discharge.  Acute metabolic encephalopathy. Improved. Baseline. Aspiration and fall Precaution.   ARF. Improved with NS iv.  Hypokalemia. Improved with KCl.  Chronic Afib RVR. Since BP is much better, resumed home dose lopressor 25 mg bid. HR is controlled after given digoxin iv one dose. Continue eliquis.Added cardizem.  Pulmonary edema (CXR) due to fluid overload. Lasix 20 mg 1 dose was given. Discontinued IV fluid. Improved.  dementia. Aspiration and fall precaution. Sacral DU stage 1.   PT consult: SNF.  DISCHARGE CONDITIONS:   Stable, discharge to SNF today.  CONSULTS OBTAINED:  Treatment Team:  Laurier Nancy, MD  DRUG ALLERGIES:   Allergies  Allergen Reactions  . Keflex [Cephalexin] Other (See Comments)     Reaction:  Tremors   . Ciprofloxacin Rash and Swelling    DISCHARGE MEDICATIONS:   Current Discharge Medication List    START taking these medications   Details  amoxicillin-clavulanate (AUGMENTIN) 875-125 MG tablet Take 1 tablet by mouth 2 (two) times daily. Qty: 6 tablet, Refills: 0    diltiazem (CARDIZEM CD) 120 MG 24 hr capsule Take 1 capsule (120 mg total) by mouth daily. Qty: 30 capsule, Refills: 0      CONTINUE these medications which have CHANGED   Details  metoprolol tartrate (LOPRESSOR) 25 MG tablet Take 1 tablet (25 mg total) by mouth 2 (two) times daily. Qty: 60 tablet, Refills: 0      CONTINUE these medications which have NOT CHANGED   Details  acetaminophen (TYLENOL) 325 MG tablet Take 650 mg by mouth every 4 (four) hours as needed for mild pain, fever or headache.    alendronate (FOSAMAX) 70 MG tablet Take 70 mg by mouth once a week. Pt takes on Sunday.   Take with a full glass of water on an empty stomach.    alum & mag hydroxide-simeth (MAALOX PLUS) 400-400-40 MG/5ML suspension Take 30 mLs by mouth every 6 (six) hours as needed for indigestion (reflux/gas).    apixaban (ELIQUIS) 2.5 MG TABS tablet Take 2.5 mg by mouth every 12 (twelve) hours.     bisacodyl (DULCOLAX) 5 MG EC tablet Take 10 mg by mouth daily as needed for moderate constipation.    calcium carbonate (TUMS - DOSED IN MG ELEMENTAL CALCIUM) 500 MG chewable tablet Chew 1 tablet by mouth 2 (two) times daily.  cholecalciferol (VITAMIN D) 1000 UNITS tablet Take 2,000 Units by mouth daily.     cyanocobalamin (,VITAMIN B-12,) 1000 MCG/ML injection Inject 1,000 mcg into the muscle every 30 (thirty) days. Pt uses on the first of every month.    donepezil (ARICEPT) 10 MG tablet Take 10 mg by mouth daily.    ferrous sulfate 325 (65 FE) MG tablet Take 1 tablet (325 mg total) by mouth 2 (two) times daily with a meal. Qty: 60 tablet, Refills: 3    gabapentin (NEURONTIN) 300 MG capsule Take 1 capsule  (300 mg total) by mouth 3 (three) times daily.    memantine (NAMENDA XR) 28 MG CP24 24 hr capsule Take 28 mg by mouth daily.    mirtazapine (REMERON) 7.5 MG tablet Take 7.5 mg by mouth at bedtime.    oxybutynin (DITROPAN) 5 MG tablet Take 2.5 mg by mouth 3 (three) times daily.     pantoprazole (PROTONIX) 40 MG tablet Take 40 mg by mouth daily.     potassium chloride SA (K-DUR,KLOR-CON) 20 MEQ tablet Take 20 mEq by mouth daily.    pravastatin (PRAVACHOL) 40 MG tablet Take 40 mg by mouth at bedtime.     bisacodyl (DULCOLAX) 10 MG suppository Place 10 mg rectally as needed for moderate constipation.      STOP taking these medications     oxyCODONE (OXY IR/ROXICODONE) 5 MG immediate release tablet      oxyCODONE (OXY IR/ROXICODONE) 5 MG immediate release tablet          DISCHARGE INSTRUCTIONS:   If you experience worsening of your admission symptoms, develop shortness of breath, life threatening emergency, suicidal or homicidal thoughts you must seek medical attention immediately by calling 911 or calling your MD immediately  if symptoms less severe.  You Must read complete instructions/literature along with all the possible adverse reactions/side effects for all the Medicines you take and that have been prescribed to you. Take any new Medicines after you have completely understood and accept all the possible adverse reactions/side effects.   Please note  You were cared for by a hospitalist during your hospital stay. If you have any questions about your discharge medications or the care you received while you were in the hospital after you are discharged, you can call the unit and asked to speak with the hospitalist on call if the hospitalist that took care of you is not available. Once you are discharged, your primary care physician will handle any further medical issues. Please note that NO REFILLS for any discharge medications will be authorized once you are discharged, as it is  imperative that you return to your primary care physician (or establish a relationship with a primary care physician if you do not have one) for your aftercare needs so that they can reassess your need for medications and monitor your lab values.    Today   SUBJECTIVE    No complaint.  VITAL SIGNS:  Blood pressure 160/81, pulse 71, temperature 97.3 F (36.3 C), temperature source Oral, resp. rate 18, height  (1.651 m), weight 67.314 kg (148 lb 6.4 oz), SpO2 93 %.  I/O:   Intake/Output Summary (Last 24 hours) at 01/14/16 0936 Last data filed at 01/14/16 0934  Gross per 24 hour  Intake    390 ml  Output      1 ml  Net    389 ml    PHYSICAL EXAMINATION:  GENERAL:  82 y.o.-year-old patient lying in the bed with no  acute distress.  EYES: Pupils equal, round, reactive to light and accommodation. No scleral icterus. Extraocular muscles intact.  HEENT: Head atraumatic, normocephalic. Oropharynx and nasopharynx clear.  NECK:  Supple, no jugular venous distention. No thyroid enlargement, no tenderness.  LUNGS: Normal breath sounds bilaterally, no wheezing, rales,rhonchi or crepitation. No use of accessory muscles of respiration.  CARDIOVASCULAR: S1, S2 normal. No murmurs, rubs, or gallops.  ABDOMEN: Soft, non-tender, non-distended. Bowel sounds present. No organomegaly or mass.  EXTREMITIES: No pedal edema, cyanosis, or clubbing.  NEUROLOGIC: Cranial nerves II through XII are intact. Muscle strength 4/5 in all extremities. Sensation intact. Gait not checked.  PSYCHIATRIC: The patient is alert and oriented x 2, demented.  SKIN: No obvious rash, lesion, or ulcer.   DATA REVIEW:   CBC  Recent Labs Lab 01/12/16 0534  WBC 5.5  HGB 9.2*  HCT 28.4*  PLT 101*    Chemistries   Recent Labs Lab 01/09/16 0728  01/11/16 0611 01/12/16 0534  NA 146*  < > 145 145  K 5.1  < > 3.4* 4.1  CL 112*  < > 113* 114*  CO2 27  < > 29 29  GLUCOSE 105*  < > 123* 76  BUN 31*  < > 26* 19   CREATININE 1.76*  < > 1.16* 1.00  CALCIUM 8.9  < > 7.9* 8.2*  MG  --   --  1.7  --   AST 235*  --   --   --   ALT 72*  --   --   --   ALKPHOS 172*  --   --   --   BILITOT 1.4*  --   --   --   < > = values in this interval not displayed.  Cardiac Enzymes  Recent Labs Lab 01/09/16 0728  TROPONINI 0.03    Microbiology Results  Results for orders placed or performed during the hospital encounter of 01/09/16  Blood Culture (routine x 2)     Status: None   Collection Time: 01/09/16  7:28 AM  Result Value Ref Range Status   Specimen Description BLOOD LEFT ANTECUBITAL  Final   Special Requests BOTTLES DRAWN AEROBIC AND ANAEROBIC  1CC  Final   Culture NO GROWTH 5 DAYS  Final   Report Status 01/14/2016 FINAL  Final  Blood Culture (routine x 2)     Status: None   Collection Time: 01/09/16  7:44 AM  Result Value Ref Range Status   Specimen Description BLOOD LEFT ANTECUBITAL  Final   Special Requests BOTTLES DRAWN AEROBIC AND ANAEROBIC  1CC  Final   Culture NO GROWTH 5 DAYS  Final   Report Status 01/14/2016 FINAL  Final  MRSA PCR Screening     Status: None   Collection Time: 01/09/16 10:55 AM  Result Value Ref Range Status   MRSA by PCR NEGATIVE NEGATIVE Final    Comment:        The GeneXpert MRSA Assay (FDA approved for NASAL specimens only), is one component of a comprehensive MRSA colonization surveillance program. It is not intended to diagnose MRSA infection nor to guide or monitor treatment for MRSA infections.   Urine culture     Status: None   Collection Time: 01/09/16  5:04 PM  Result Value Ref Range Status   Specimen Description URINE, RANDOM  Final   Special Requests NONE  Final   Culture MULTIPLE SPECIES PRESENT, SUGGEST RECOLLECTION  Final   Report Status 01/11/2016 FINAL  Final  RADIOLOGY:  No results found.      Management plans discussed with the patient, family and they are in agreement.  CODE STATUS:     Code Status Orders        Start      Ordered   01/09/16 1055  Do not attempt resuscitation (DNR)   Continuous    Question Answer Comment  In the event of cardiac or respiratory ARREST Do not call a "code blue"   In the event of cardiac or respiratory ARREST Do not perform Intubation, CPR, defibrillation or ACLS   In the event of cardiac or respiratory ARREST Use medication by any route, position, wound care, and other measures to relive pain and suffering. May use oxygen, suction and manual treatment of airway obstruction as needed for comfort.      01/09/16 1054    Code Status History    Date Active Date Inactive Code Status Order ID Comments User Context   12/16/2015  8:03 AM 12/20/2015  6:45 PM DNR [id number]  Enedina Finner, MD Inpatient   12/15/2015 10:27 PM 12/16/2015  8:03 AM Full Code [id number]  Altamese Dilling, MD Inpatient   12/15/2015  9:56 PM 12/15/2015 10:27 PM DNR [id number]  Altamese Dilling, MD ED   08/16/2015  6:13 PM 08/19/2015  3:21 PM DNR [id number]  Deeann Saint, MD Inpatient   08/13/2015  3:31 PM 08/16/2015  6:13 PM DNR [id number]  Houston Siren, MD ED      TOTAL TIME TAKING CARE OF THIS PATIENT: 36 minutes.    Shaune Pollack M.D on 01/14/2016 at 9:36 AM  Between 7am to 6pm - Pager - [phone]  After 6pm go to [email or web address] - password [email or web address] [facility]  [facility]  Office  [phone]  CC: Primary care physician; Margaretann Loveless, MD

## 2016-01-14 NOTE — Discharge Instructions (Signed)
Heart healthy diet. Activity as tolerated. Aspiration and fall precaution.

## 2016-01-28 ENCOUNTER — Encounter
Admission: RE | Admit: 2016-01-28 | Discharge: 2016-01-28 | Disposition: A | Discharge status: Home / Self Care | Payer: Medicare Other | Source: Ambulatory Visit | Attending: Internal Medicine | Admitting: Internal Medicine

## 2016-09-16 ENCOUNTER — Other Ambulatory Visit
Admission: RE | Admit: 2016-09-16 | Discharge: 2016-09-16 | Disposition: A | Discharge status: Home / Self Care | Payer: Medicare Other | Source: Ambulatory Visit | Attending: Internal Medicine | Admitting: Internal Medicine

## 2016-09-16 DIAGNOSIS — M25462 Effusion, left knee: Secondary | ICD-10-CM | POA: Diagnosis present

## 2016-09-16 LAB — SYNOVIAL CELL COUNT + DIFF, W/ CRYSTALS
CRYSTALS FLUID: NONE SEEN
Eosinophils-Synovial: 0 %
LYMPHOCYTES-SYNOVIAL FLD: 6 %
Monocyte-Macrophage-Synovial Fluid: 4 %
NEUTROPHIL, SYNOVIAL: 90 %
Other Cells-SYN: 0
WBC, SYNOVIAL: 7127 /mm3 — AB (ref 0–200)

## 2016-09-25 LAB — AEROBIC/ANAEROBIC CULTURE (SURGICAL/DEEP WOUND): CULTURE: NORMAL

## 2016-09-25 LAB — AEROBIC/ANAEROBIC CULTURE W GRAM STAIN (SURGICAL/DEEP WOUND)

## 2017-01-18 IMAGING — CR DG KNEE 1-2V*L*
1 series · 2 of 2 positions shown · non-contrast
Comparison: None.

CLINICAL DATA: Fall, left knee pain

EXAM:
LEFT KNEE - 1-2 VIEW

[Series 1: lat · 0.17mm/px · 2 of 2 slices shown]
[im 1/2]
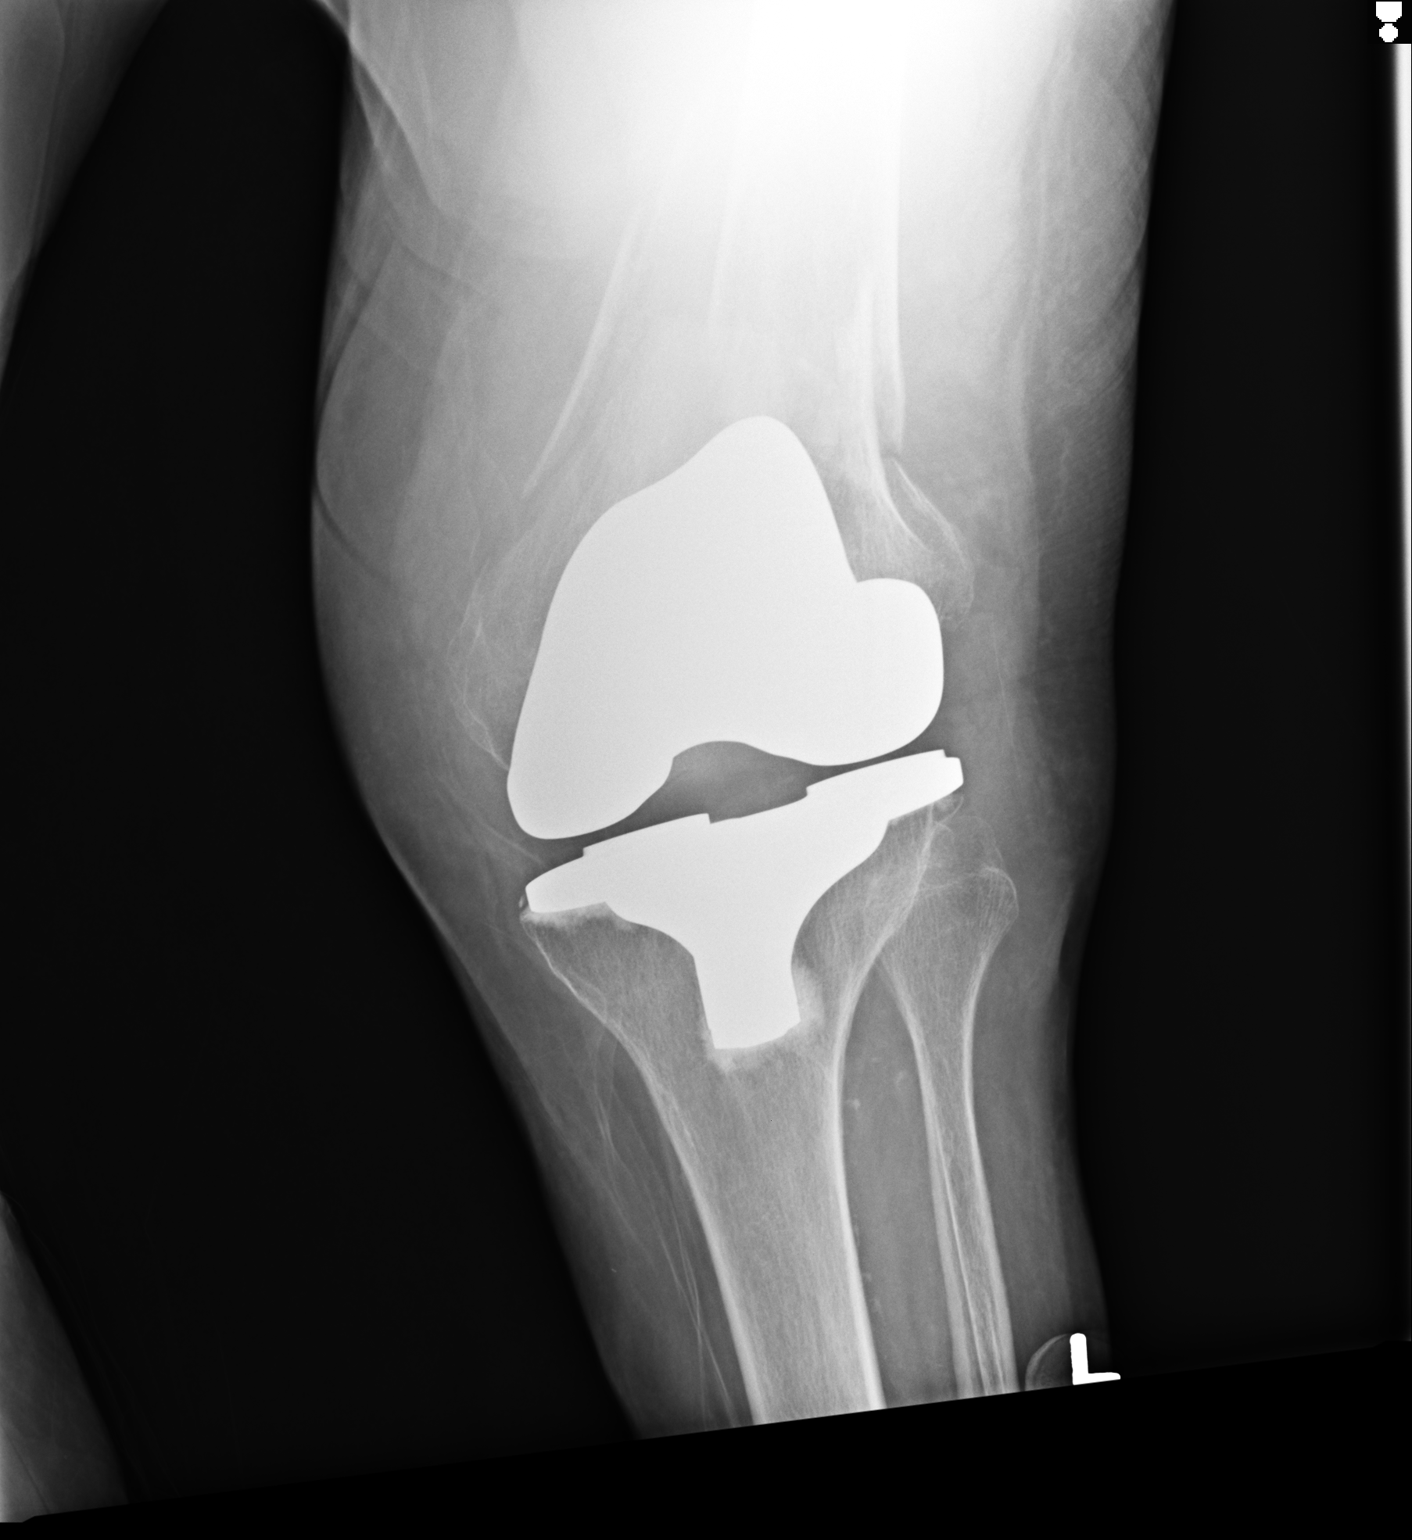
[im 2/2]
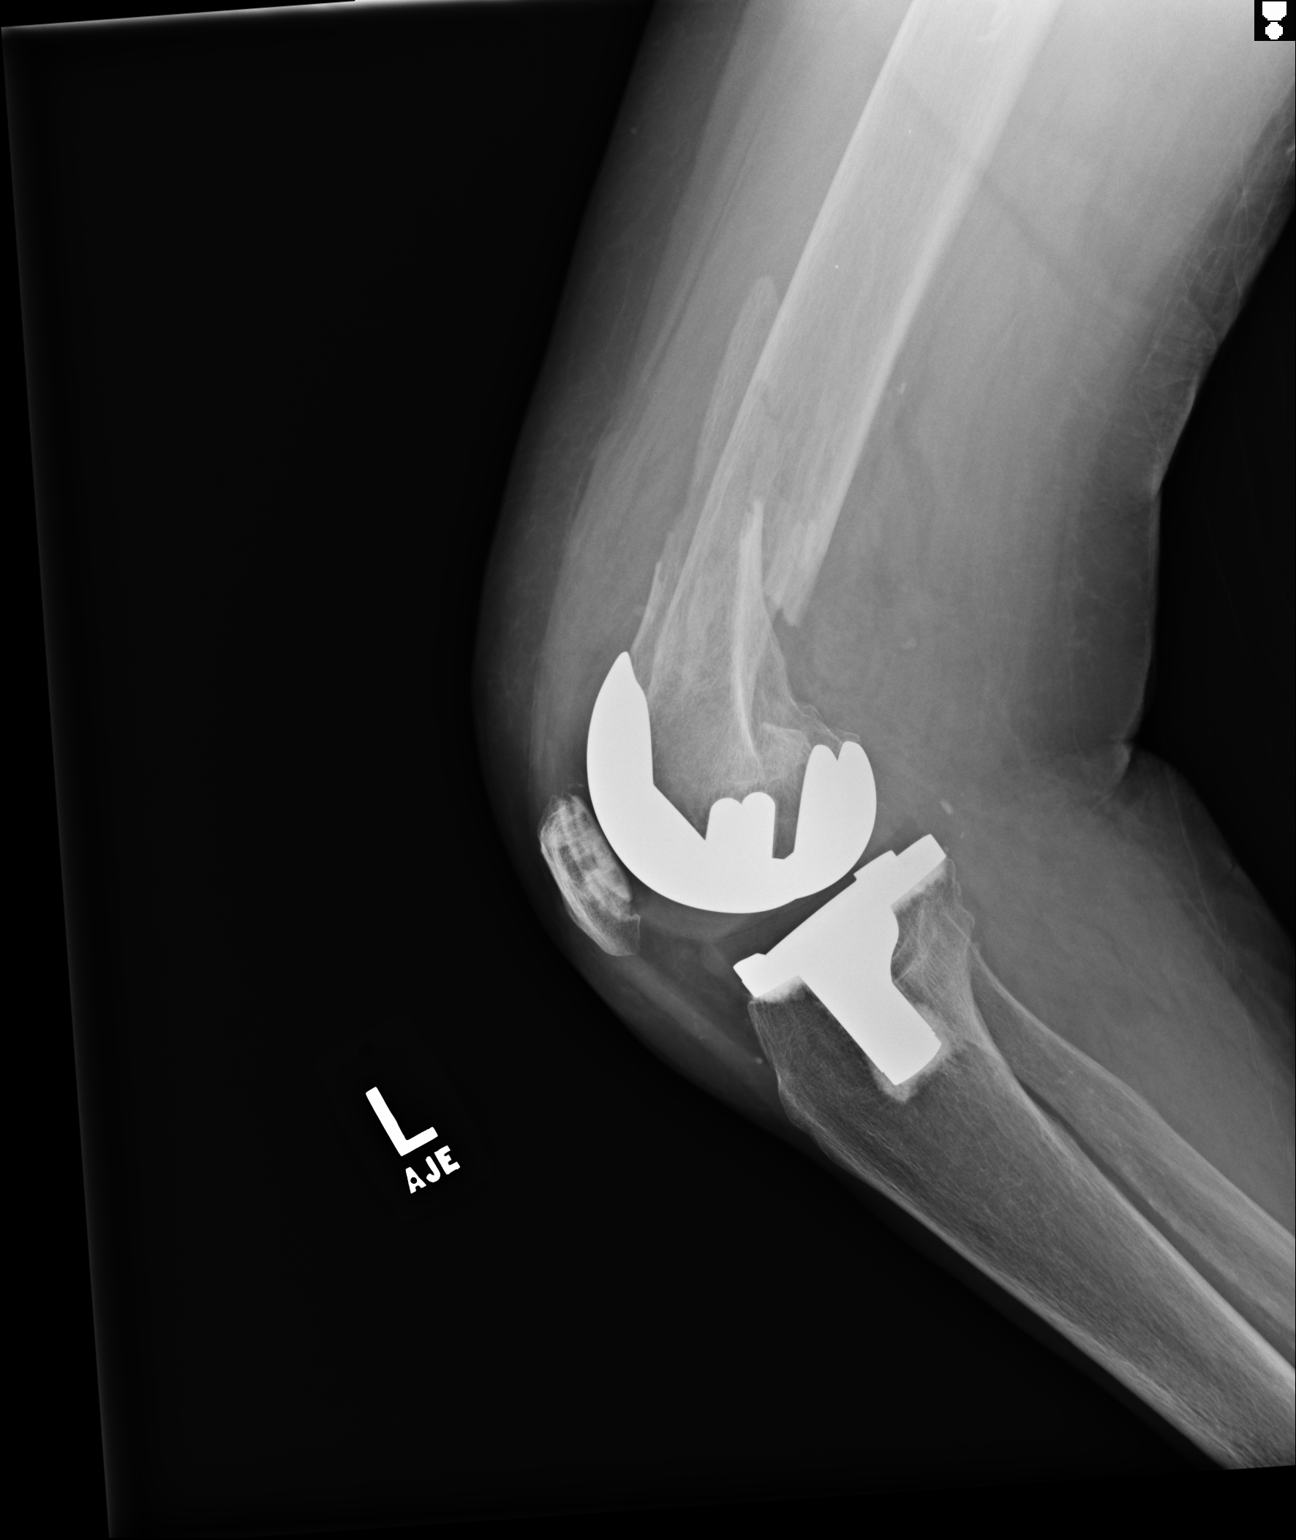

[2 of 2 positions shown; findings below may reference images not displayed]

FINDINGS: Two views of the left knee submitted. There is displaced mild
comminuted fracture distal shaft of left femur. Left knee prosthesis
with anatomic alignment. No evidence of loosening of prosthesis.
IMPRESSION: Displaced mild comminuted fracture distal shaft of left femur. Left
knee prosthesis with anatomic alignment.

## 2017-01-27 IMAGING — CT CT ANGIO CHEST
1 of 2 series · 18 of 30 positions shown · IV contrast (omnipaque)
Comparison: Chest radiograph of August 13, 2015.

CLINICAL DATA: Shortness of breath.

EXAM:
CT ANGIOGRAPHY CHEST WITH CONTRAST
TECHNIQUE: Multidetector CT imaging of the chest was performed using the
standard protocol during bolus administration of intravenous
contrast. Multiplanar CT image reconstructions and MIPs were
obtained to evaluate the vascular anatomy.
CONTRAST:  60mL OMNIPAQUE IOHEXOL 350 MG/ML SOLN

[Series 5: pe 1.0 thins · axial · 0.63mm/px · z∈[-850,-599]mm · 18 of 283 slices shown]
[im 16/283  lung]
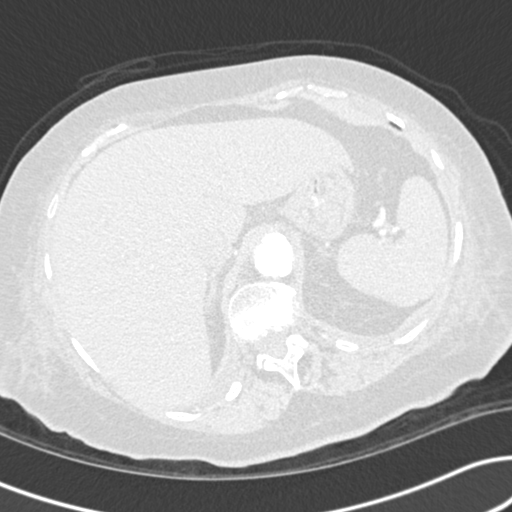
[im 32/283  mediastinal]
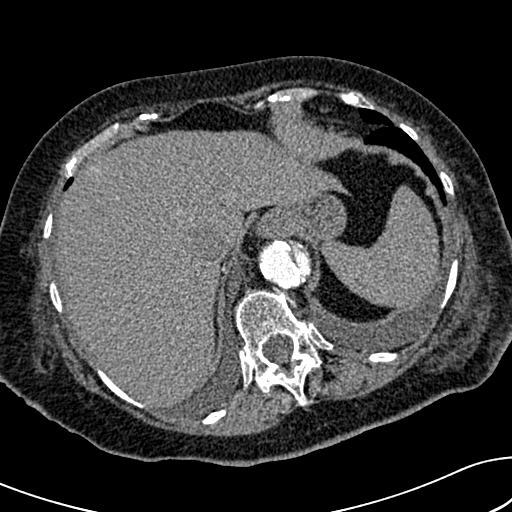
[im 48/283  lung]
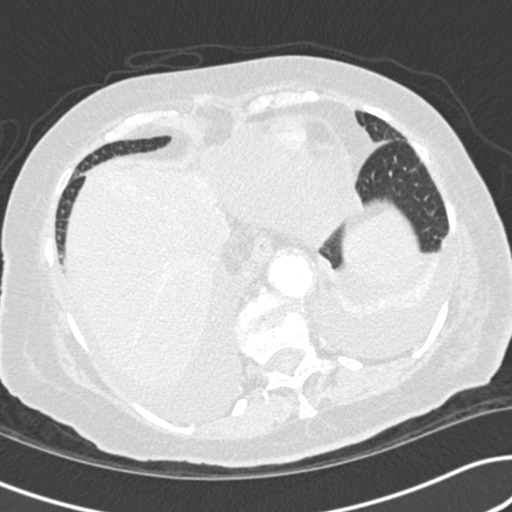
[im 63/283  mediastinal]
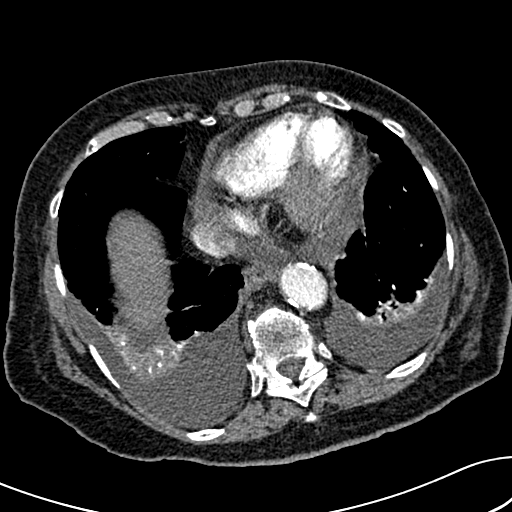
[im 79/283  lung]
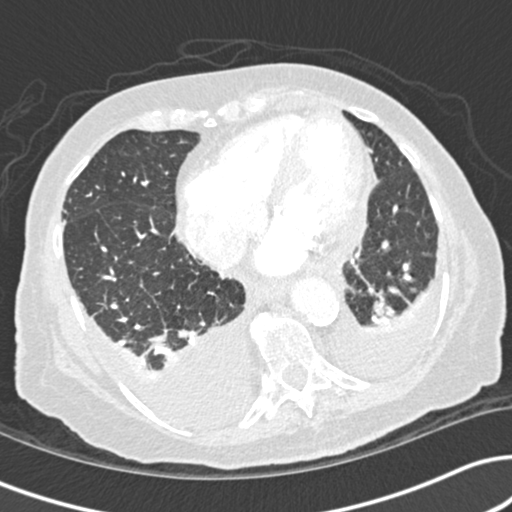
[im 95/283  mediastinal]
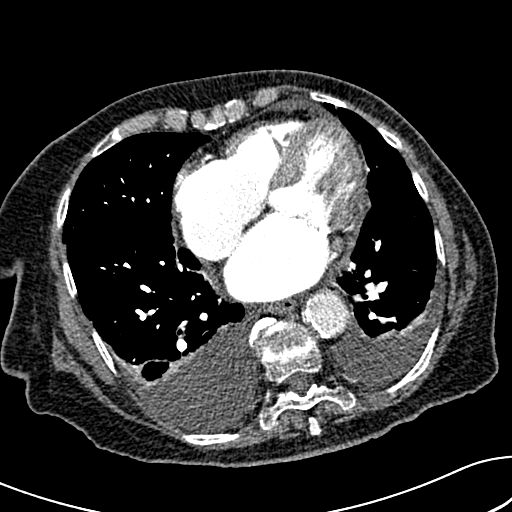
[im 110/283  lung]
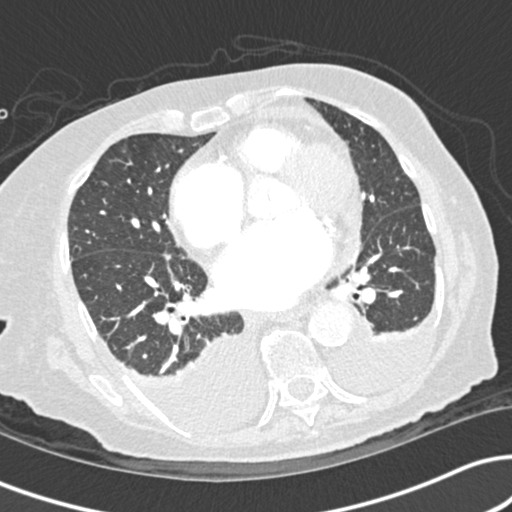
[im 126/283  mediastinal]
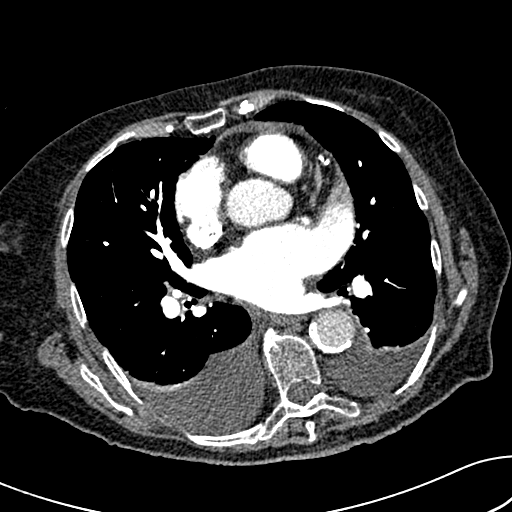
[im 132/283  lung]
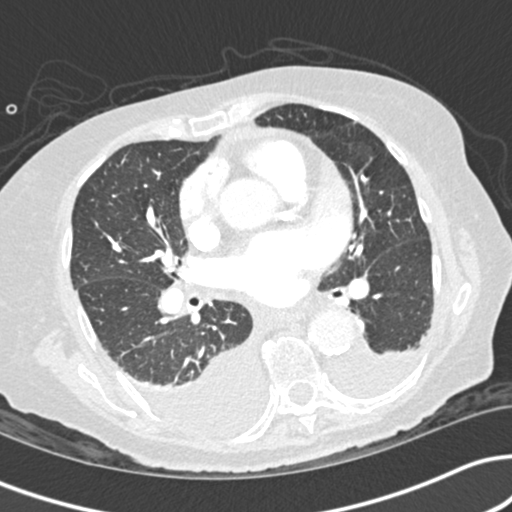
[im 142/283  mediastinal]
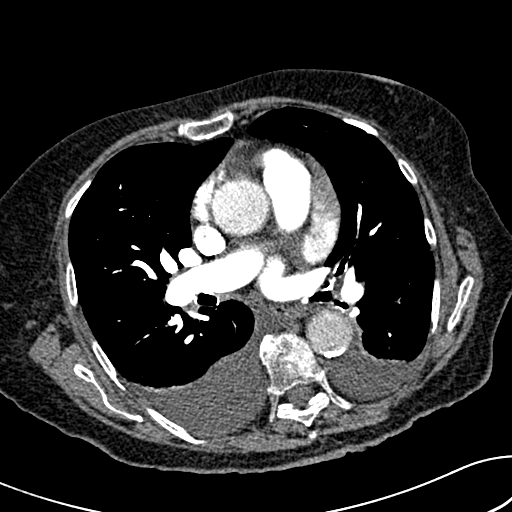
[im 157/283  lung]
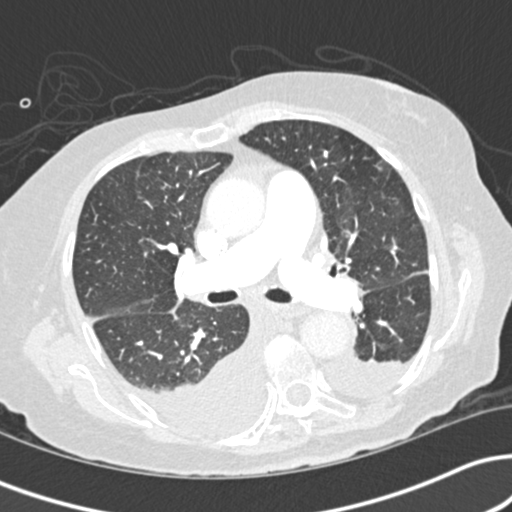
[im 173/283  mediastinal]
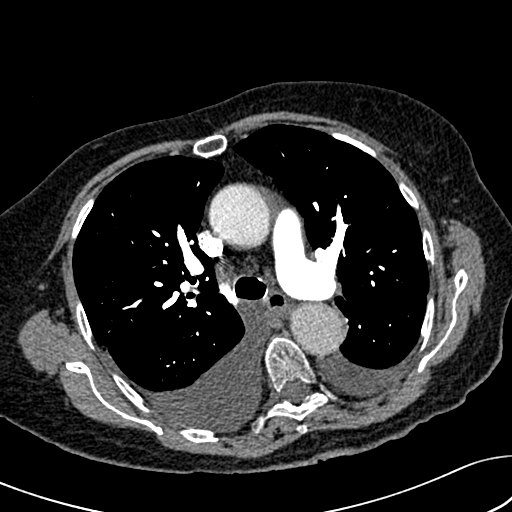
[im 189/283  lung]
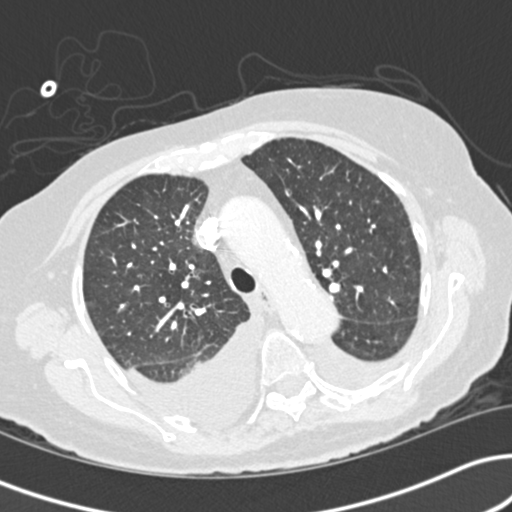
[im 204/283  mediastinal]
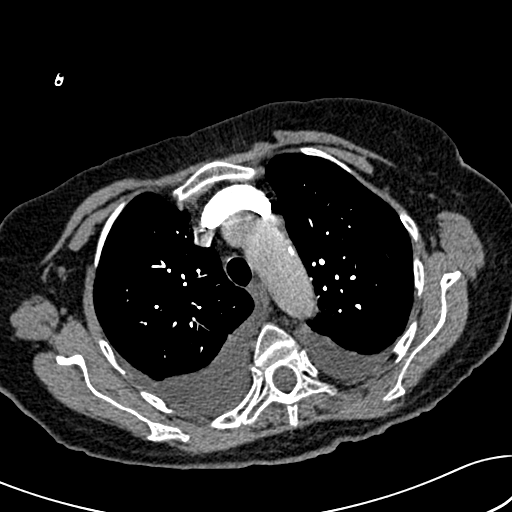
[im 220/283  lung]
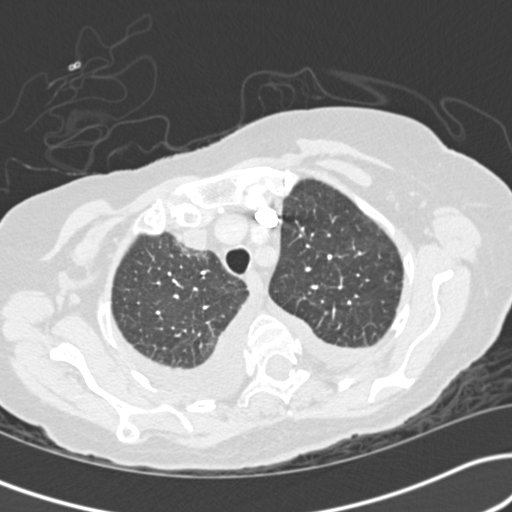
[im 236/283  mediastinal]
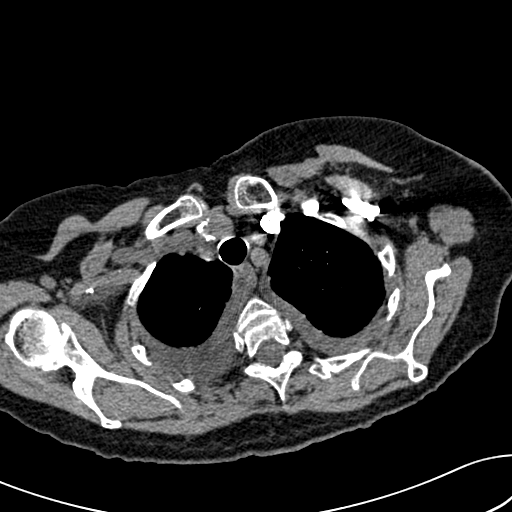
[im 251/283  lung]
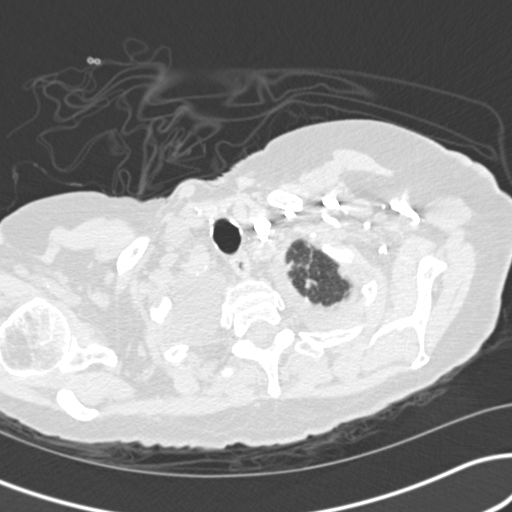
[im 267/283  mediastinal]
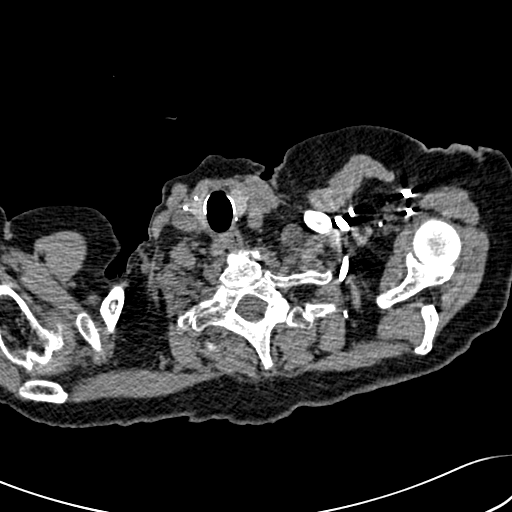

[18 of 30 positions shown; findings below may reference images not displayed]

FINDINGS: No pneumothorax is noted. Moderate bilateral pleural effusions are
noted with right greater than left. Adjacent subsegmental
atelectasis is noted. There is no evidence of pulmonary embolus.
Atherosclerosis of thoracic aorta is noted. No dissection is noted.
Ascending thoracic aorta is mildly dilated at 3.8 cm. Coronary
artery calcifications are noted. Mild pericardial effusion is noted.
Degenerative changes seen involving both glenohumeral joints.
Enlargement of the pulmonary arteries is noted suggesting pulmonary
artery hypertension.

Review of the MIP images confirms the above findings.
IMPRESSION: No evidence of pulmonary embolus.

Coronary artery calcifications are noted suggesting coronary artery
disease.

Mild pericardial effusion is noted.

Moderate bilateral pleural effusions are noted with right greater
than left, with adjacent subsegmental atelectasis.

Atherosclerosis of thoracic aorta is noted without evidence of
dissection.

## 2017-05-22 IMAGING — CR DG CHEST 1V PORT
1 series · 1 of 1 positions shown · non-contrast
Comparison: Chest x-ray 11/08/2015.

CLINICAL DATA: 89-year-old female with complaint of altered mental
status. Febrile. 92% oxygen saturation on room air. Recently treated
for urinary tract infection, still on antibiotics.

EXAM:
PORTABLE CHEST 1 VIEW

[ap]
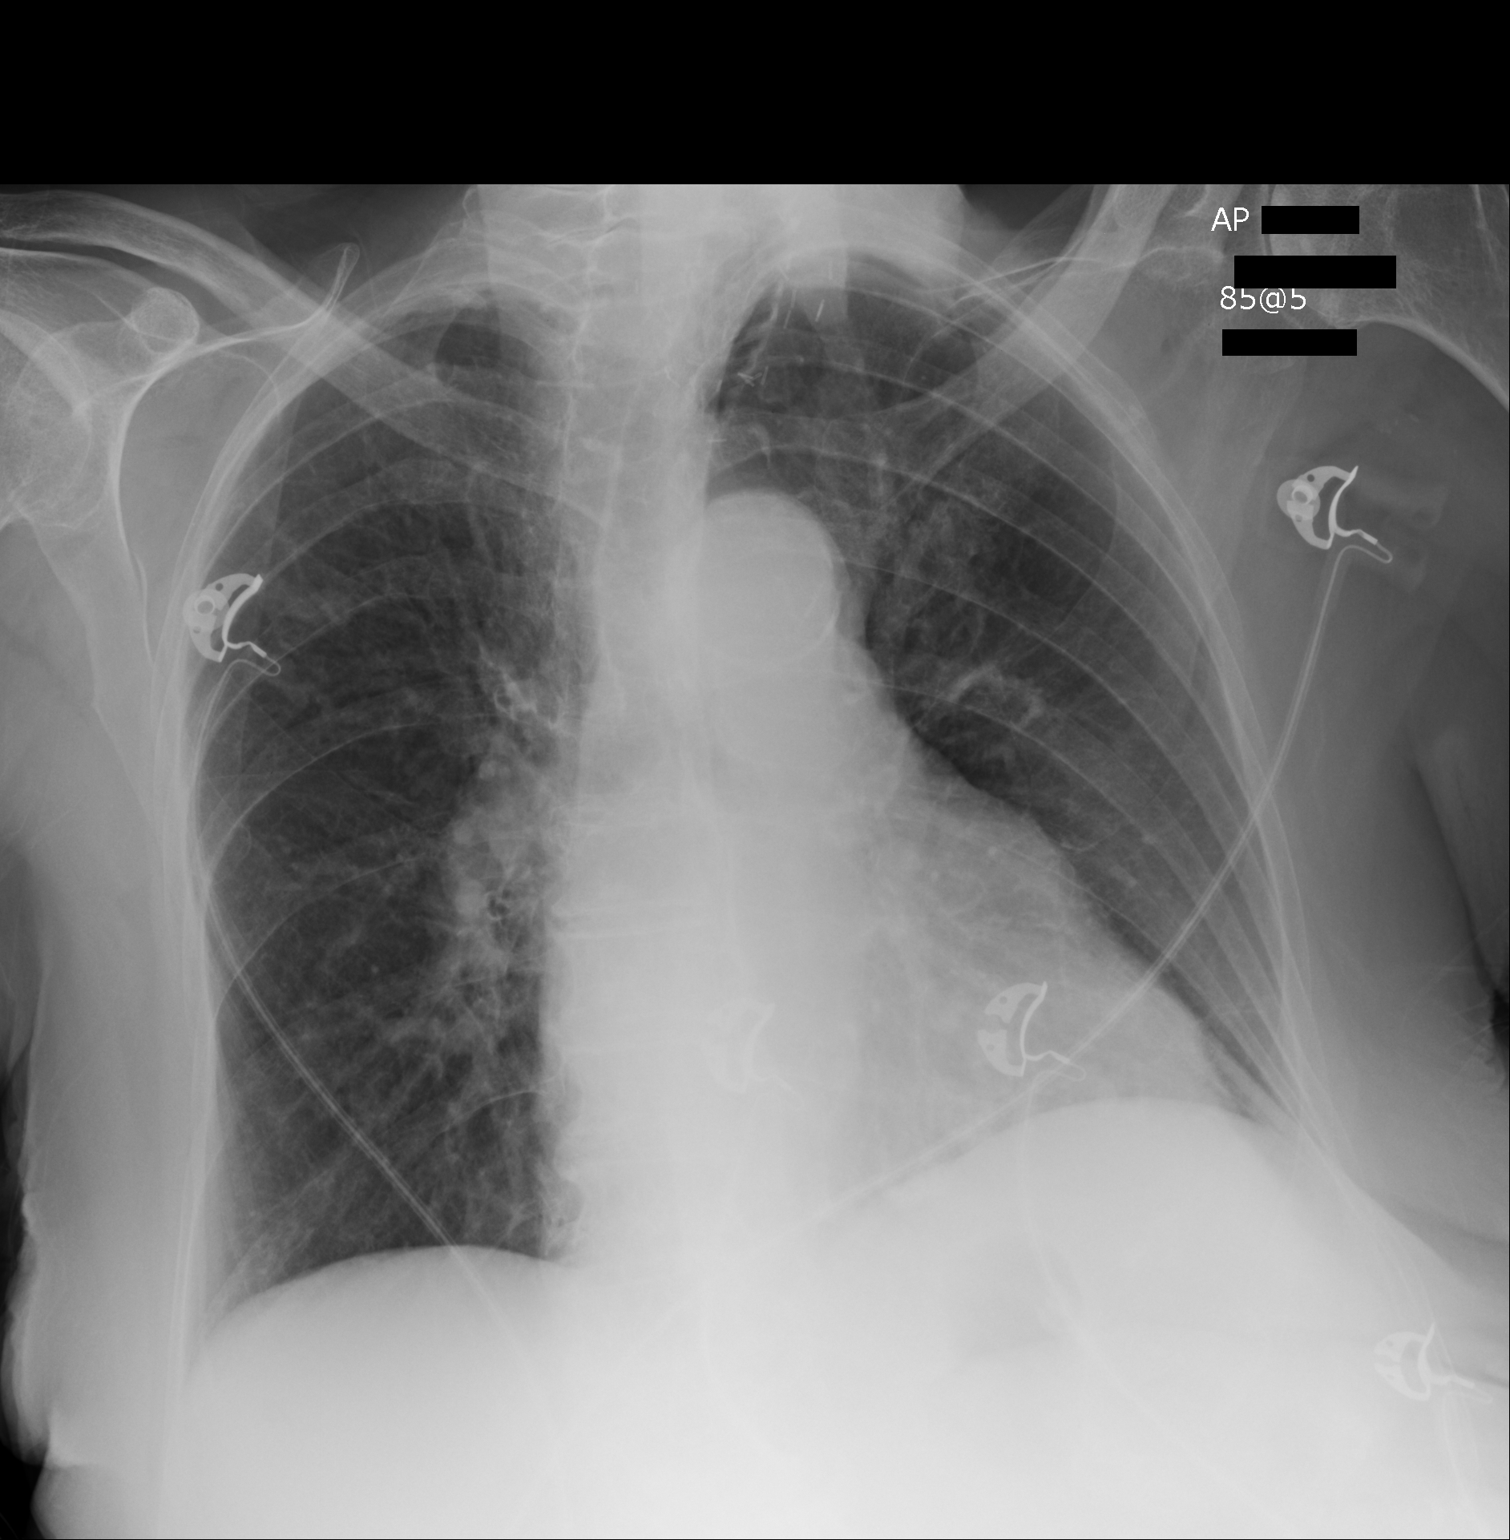

[1 of 1 positions shown; findings below may reference images not displayed]

FINDINGS: Mild diffuse peribronchial cuffing. Lung volumes are normal. No
consolidative airspace disease. No pleural effusions. No evidence of
pulmonary edema. Heart size is borderline enlarged. The patient is
rotated to the left on today's exam, resulting in distortion of the
mediastinal contours and reduced diagnostic sensitivity and
specificity for mediastinal pathology. Atherosclerosis in the
thoracic aorta. Surgical clips at the level of the thoracic inlet,
potentially from prior thyroidectomy.
IMPRESSION: 1. Mild diffuse peribronchial cuffing, suggesting an acute
bronchitis.
2. Atherosclerosis.
3. Borderline cardiomegaly.

## 2017-05-24 IMAGING — CR DG CHEST 1V
1 series · 2 of 2 positions shown · non-contrast
Comparison: 12/15/2015.

CLINICAL DATA: 89-year-old female PICC line placement. Initial
encounter.

EXAM:
CHEST 1 VIEW

[Series 1: ap · 0.17mm/px · 2 of 2 slices shown]
[im 1/2]
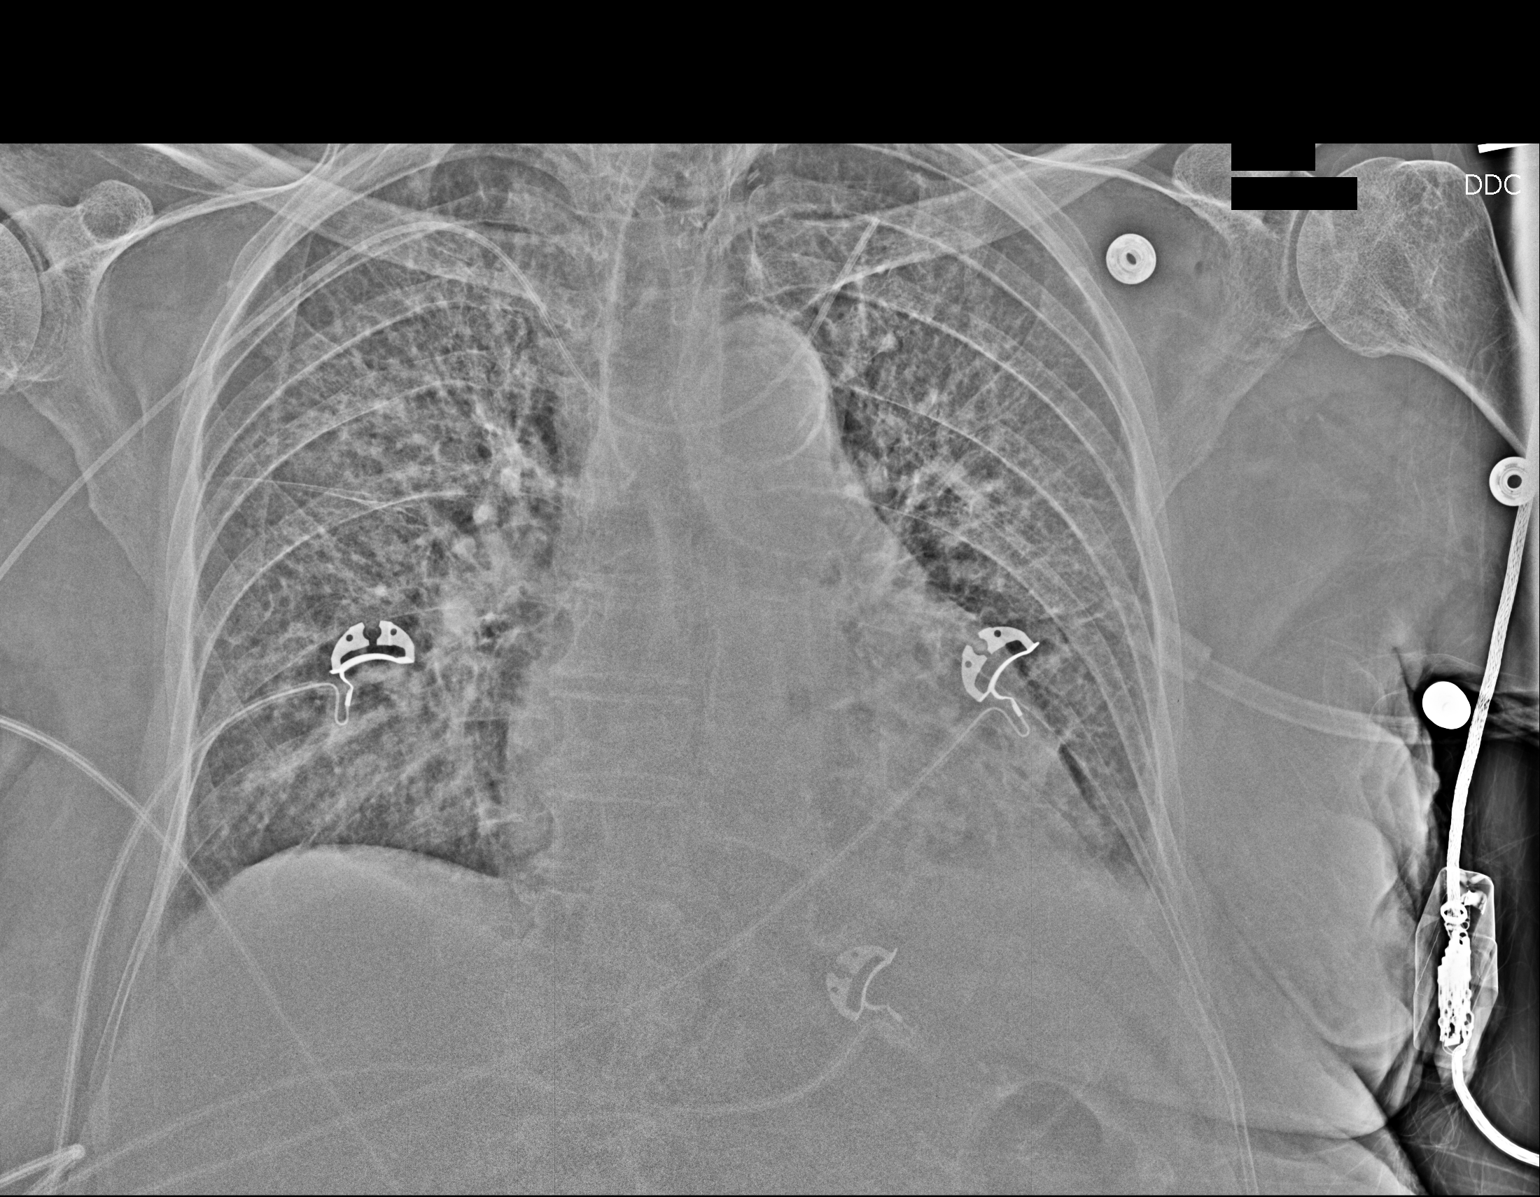
[im 2/2]
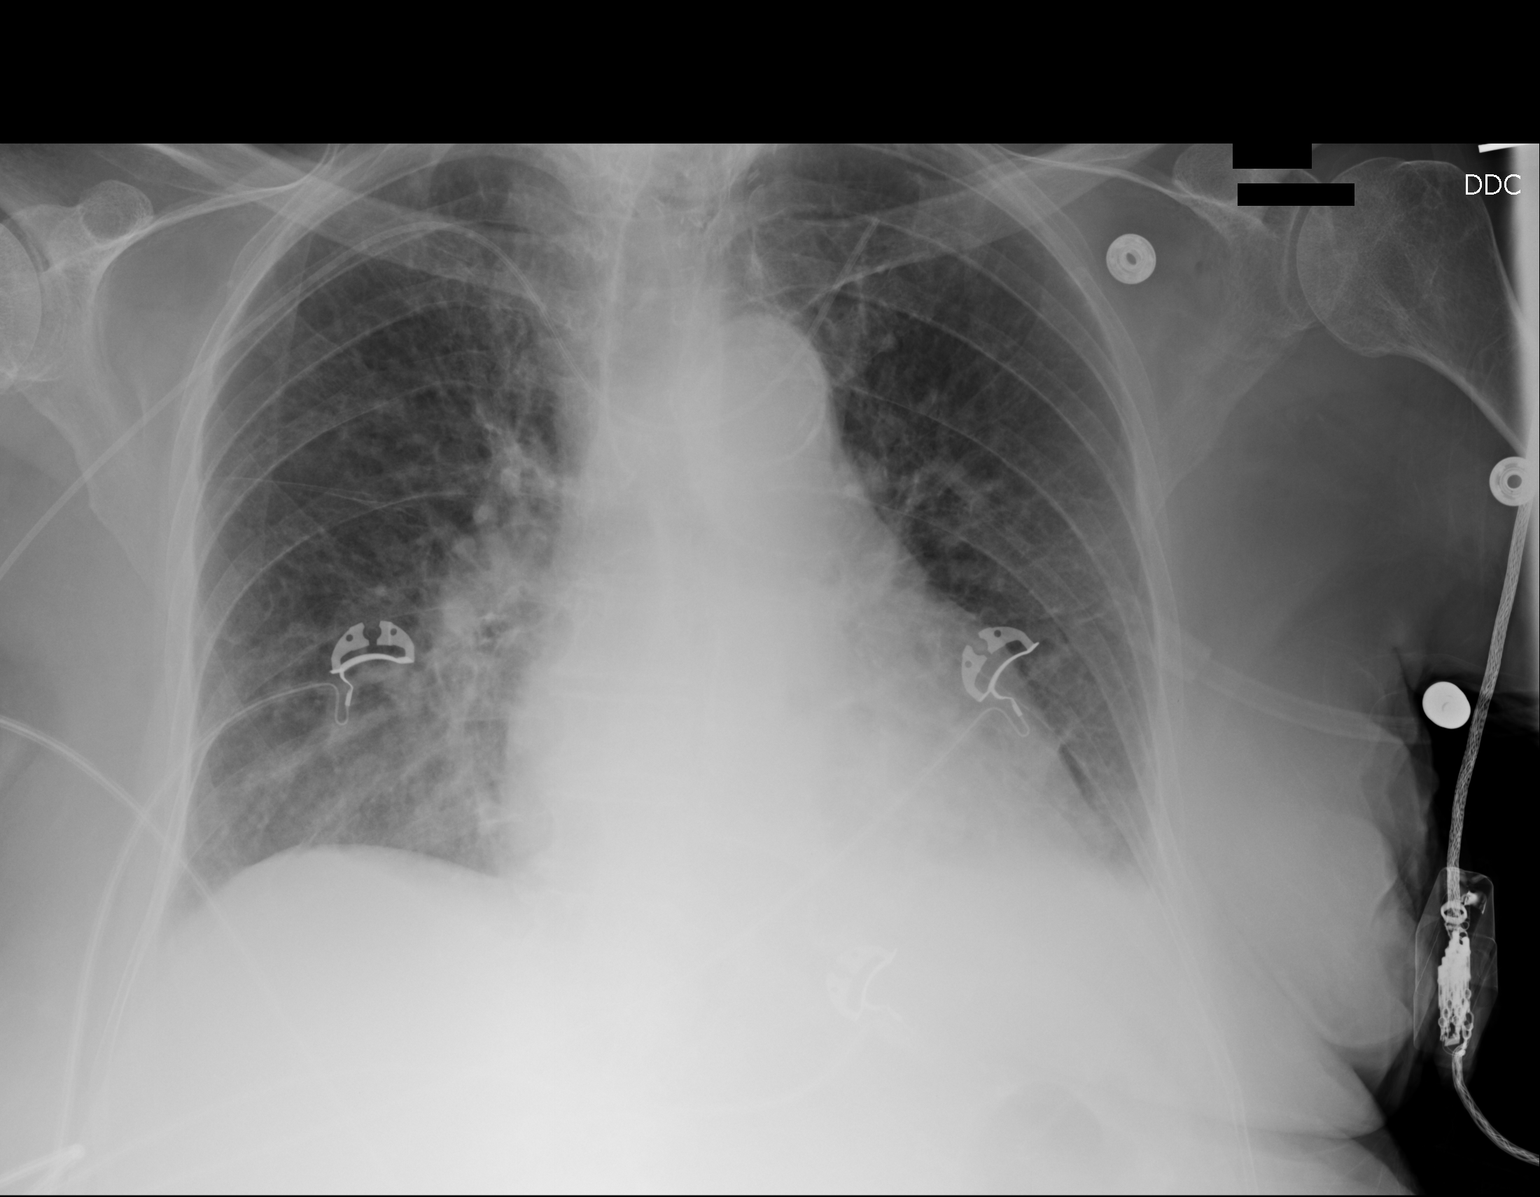

[2 of 2 positions shown; findings below may reference images not displayed]

FINDINGS: Portable AP upright view at 8400 hours. Right upper extremity
approach PICC line has crossed midline via the innominate vein and
the tip projects at the level of the left subclavian vein.

Lower lung volumes with mildly increased patchy lung base opacity.
Mild blunting of the left costophrenic angle. Stable cardiomegaly
and mediastinal contours. No pneumothorax.
IMPRESSION: 1. Right upper extremity approach PICC line crossed midline into the
left innominate vein and terminates at the left subclavian vein
level. This should be repositioned.
2. Increased lung base patchy opacity, with top considerations of
atelectasis and infection. Possible small left pleural effusion.

## 2017-05-25 IMAGING — CR DG CHEST 1V
1 series · 1 of 1 positions shown · non-contrast
Comparison: Portable exam 5179 hours compared to 12/17/2015

CLINICAL DATA: Shortness of breath, history atrial fibrillation,
dementia, hypertension

EXAM:
CHEST 1 VIEW

[portable]
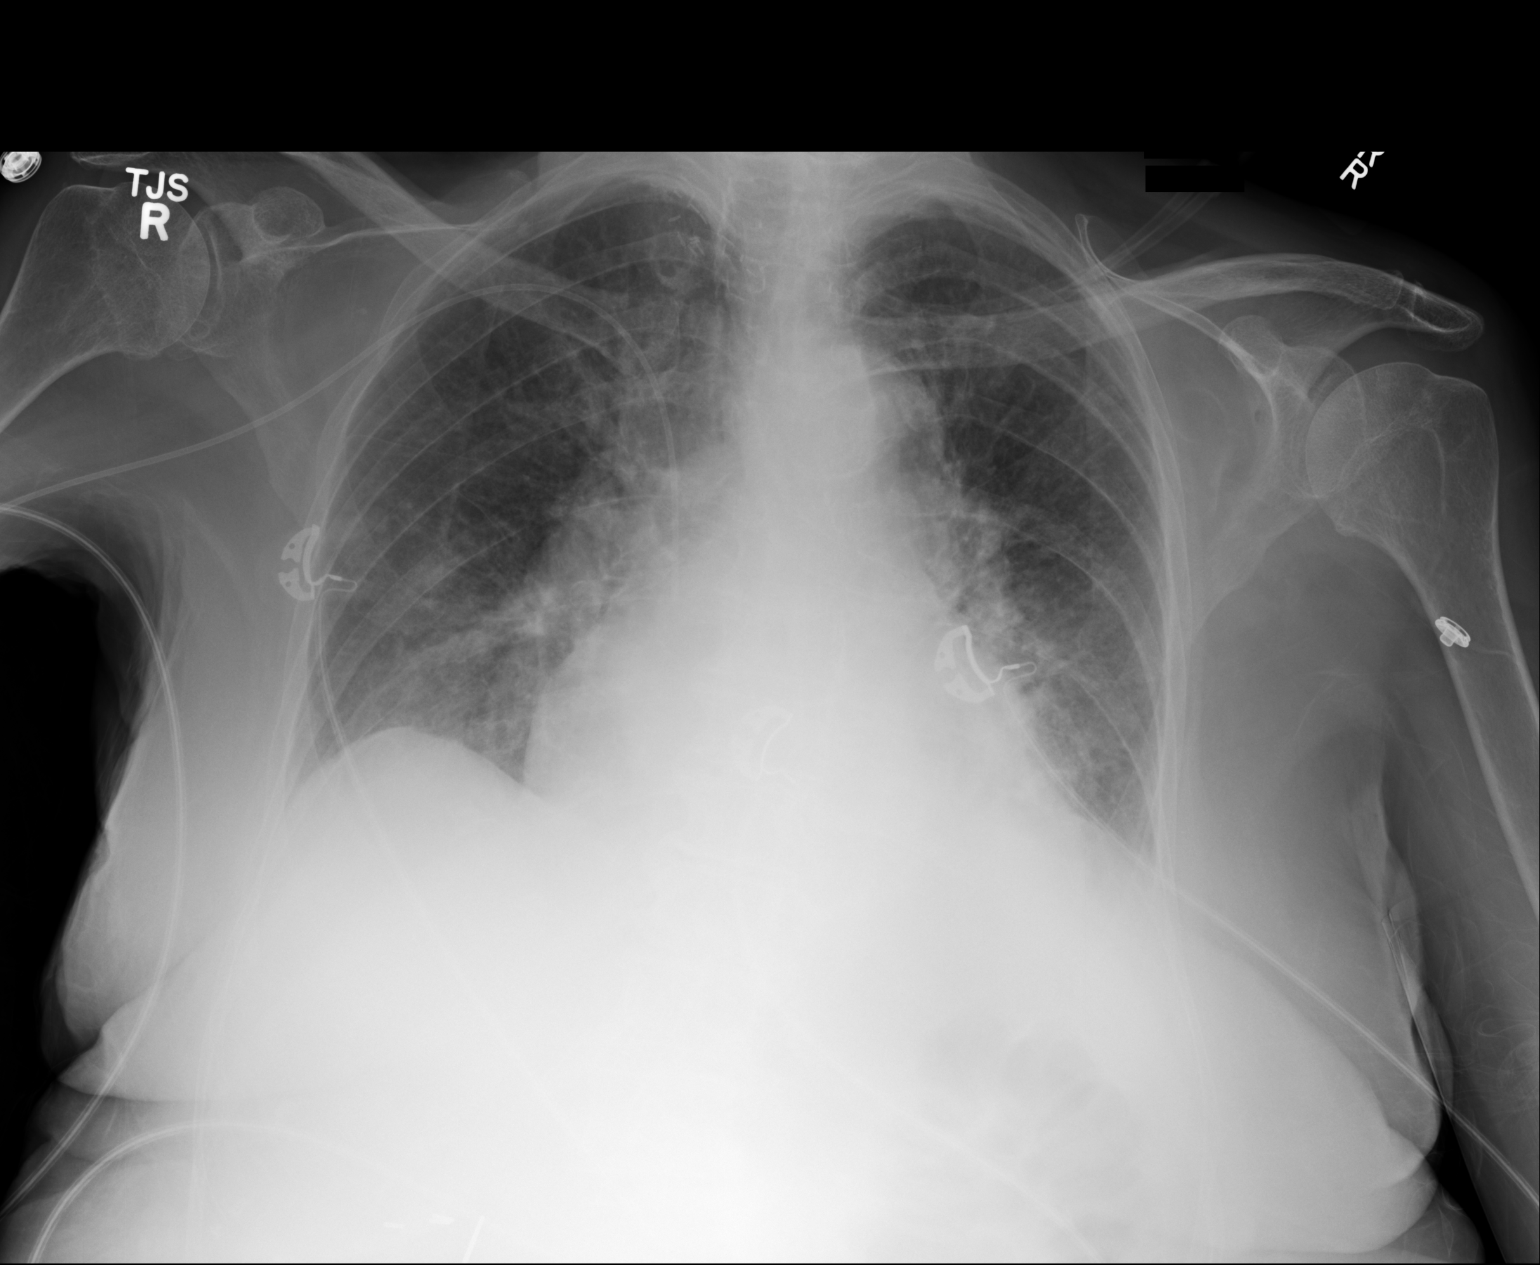

[1 of 1 positions shown; findings below may reference images not displayed]

FINDINGS: RIGHT arm PICC line tip projects over SVC repositioned since prior
study.

Enlargement of cardiac silhouette.

Atherosclerotic calcification aorta.

Pulmonary vascularity grossly normal.

Slight prominent hila.

BILATERAL perihilar and basilar infiltrates which could represent
edema or infection, little changed.

No gross pleural effusion or pneumothorax.

Bones demineralized.
IMPRESSION: Enlargement of cardiac silhouette.

BILATERAL pulmonary infiltrates as above which could represent
infection or edema.

## 2017-08-29 DEATH — deceased
